# Patient Record
Sex: Female | Born: 1995 | Race: White | Hispanic: No | Marital: Single | Smoking: Former smoker
Health system: Southern US, Community
[De-identification: ages and names within clinical notes are randomized; demographics above are authoritative.]

## PROBLEM LIST (undated history)

## (undated) ENCOUNTER — Inpatient Hospital Stay (HOSPITAL_COMMUNITY): Payer: Self-pay

## (undated) DIAGNOSIS — F419 Anxiety disorder, unspecified: Secondary | ICD-10-CM

## (undated) DIAGNOSIS — R1031 Right lower quadrant pain: Principal | ICD-10-CM

## (undated) DIAGNOSIS — E039 Hypothyroidism, unspecified: Secondary | ICD-10-CM

## (undated) DIAGNOSIS — O8612 Endometritis following delivery: Secondary | ICD-10-CM

## (undated) DIAGNOSIS — F329 Major depressive disorder, single episode, unspecified: Secondary | ICD-10-CM

## (undated) DIAGNOSIS — R569 Unspecified convulsions: Secondary | ICD-10-CM

## (undated) DIAGNOSIS — F32A Depression, unspecified: Secondary | ICD-10-CM

## (undated) DIAGNOSIS — Z3401 Encounter for supervision of normal first pregnancy, first trimester: Principal | ICD-10-CM

## (undated) DIAGNOSIS — F1190 Opioid use, unspecified, uncomplicated: Secondary | ICD-10-CM

## (undated) DIAGNOSIS — N159 Renal tubulo-interstitial disease, unspecified: Secondary | ICD-10-CM

## (undated) HISTORY — DX: Right lower quadrant pain: R10.31

## (undated) HISTORY — DX: Endometritis following delivery: O86.12

## (undated) HISTORY — DX: Encounter for supervision of normal first pregnancy, first trimester: Z34.01

## (undated) HISTORY — DX: Renal tubulo-interstitial disease, unspecified: N15.9

## (undated) HISTORY — DX: Hypothyroidism, unspecified: E03.9

## (undated) HISTORY — PX: WISDOM TOOTH EXTRACTION: SHX21

---
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
## 2013-06-27 ENCOUNTER — Other Ambulatory Visit: Payer: Self-pay

## 2013-06-27 MED ORDER — LEVONORGESTREL-ETHINYL ESTRAD 0.1-20 MG-MCG PO TABS: 1.0000 | ORAL_TABLET | Freq: Every day | ORAL | Status: DC

## 2013-06-27 NOTE — Telephone Encounter (Signed)
Last seen 1/14  MMM  No PAP

## 2013-08-12 ENCOUNTER — Encounter (HOSPITAL_COMMUNITY): Payer: Self-pay | Admitting: Emergency Medicine

## 2013-08-12 ENCOUNTER — Emergency Department (HOSPITAL_COMMUNITY)
Admission: EM | Admit: 2013-08-12 | Discharge: 2013-08-12 | Disposition: A | Discharge status: Home / Self Care | Payer: Medicaid Other | Attending: Emergency Medicine | Admitting: Emergency Medicine

## 2013-08-12 ENCOUNTER — Emergency Department (HOSPITAL_COMMUNITY): Payer: Medicaid Other

## 2013-08-12 DIAGNOSIS — S61412A Laceration without foreign body of left hand, initial encounter: Secondary | ICD-10-CM

## 2013-08-12 DIAGNOSIS — S0990XA Unspecified injury of head, initial encounter: Secondary | ICD-10-CM

## 2013-08-12 DIAGNOSIS — S61409A Unspecified open wound of unspecified hand, initial encounter: Secondary | ICD-10-CM | POA: Insufficient documentation

## 2013-08-12 DIAGNOSIS — S0003XA Contusion of scalp, initial encounter: Secondary | ICD-10-CM | POA: Insufficient documentation

## 2013-08-12 DIAGNOSIS — Z3202 Encounter for pregnancy test, result negative: Secondary | ICD-10-CM | POA: Insufficient documentation

## 2013-08-12 DIAGNOSIS — IMO0002 Reserved for concepts with insufficient information to code with codable children: Secondary | ICD-10-CM | POA: Insufficient documentation

## 2013-08-12 DIAGNOSIS — T7491XA Unspecified adult maltreatment, confirmed, initial encounter: Secondary | ICD-10-CM | POA: Insufficient documentation

## 2013-08-12 DIAGNOSIS — T7492XA Unspecified child maltreatment, confirmed, initial encounter: Secondary | ICD-10-CM | POA: Insufficient documentation

## 2013-08-12 DIAGNOSIS — S0993XA Unspecified injury of face, initial encounter: Secondary | ICD-10-CM | POA: Insufficient documentation

## 2013-08-12 MED ORDER — IBUPROFEN 400 MG PO TABS
400.0000 mg | ORAL_TABLET | Freq: Once | ORAL | Status: AC
  Administered 2013-08-12: 400 mg via ORAL
  Filled 2013-08-12: qty 1

## 2013-08-12 MED ORDER — IBUPROFEN 800 MG PO TABS: 800.0000 mg | ORAL_TABLET | Freq: Once | ORAL | Status: DC
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
## 2013-08-12 NOTE — ED Notes (Signed)
Pt undress and collar placed on pt. Family at the bedside.

## 2013-08-12 NOTE — ED Notes (Signed)
Pt soaking hand to clean

## 2013-08-12 NOTE — ED Provider Notes (Signed)
Medical screening examination/treatment/procedure(s) were performed by non-physician practitioner and as supervising physician I was immediately available for consultation/collaboration.  EKG Interpretation   None         Lyanne Co, MD 08/12/13 2302
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
## 2013-08-21 NOTE — L&D Delivery Note (Signed)
Delivery Note Pt was noted to be complete and ready to push. At 12:13 AM a viable female was delivered via  (Presentation: ROA  ).  APGAR: 8, 9;   Placenta status: Intact, Spontaneous.  Cord: 3 vessels with the following complications: None.    Anesthesia: Epidural  Episiotomy: none Lacerations: 2nd degree perineal Suture Repair: 2.0 vicryl Est. Blood Loss (mL): 300  Mom to postpartum.  Baby to Couplet care / Skin to Skin.  Tomma Rakers, MD, PGY3 05/07/2014, 12:47 AM

## 2013-08-21 NOTE — L&D Delivery Note (Signed)
I have seen and examined this patient and I agree with the above. Cam Hai CNM 12:50 AM 05/07/2014

## 2013-09-15 ENCOUNTER — Other Ambulatory Visit: Payer: Self-pay | Admitting: Obstetrics & Gynecology

## 2013-09-15 DIAGNOSIS — O3680X0 Pregnancy with inconclusive fetal viability, not applicable or unspecified: Secondary | ICD-10-CM

## 2013-09-18 ENCOUNTER — Ambulatory Visit (INDEPENDENT_AMBULATORY_CARE_PROVIDER_SITE_OTHER): Payer: Medicaid Other

## 2013-09-18 ENCOUNTER — Encounter (INDEPENDENT_AMBULATORY_CARE_PROVIDER_SITE_OTHER): Payer: Self-pay

## 2013-09-18 DIAGNOSIS — O3680X0 Pregnancy with inconclusive fetal viability, not applicable or unspecified: Secondary | ICD-10-CM

## 2013-09-18 NOTE — Progress Notes (Signed)
U/S-single IUP with +FCA noted, FHR-159bpm, cx appears long and closed, bilateral adnexa appears wnl, no free fluid noted, CRL c/w dates of EDD 05/04/2014

## 2013-09-23 ENCOUNTER — Ambulatory Visit (INDEPENDENT_AMBULATORY_CARE_PROVIDER_SITE_OTHER): Payer: Medicaid Other | Admitting: Adult Health

## 2013-09-23 ENCOUNTER — Encounter: Payer: Self-pay | Admitting: Adult Health

## 2013-09-23 ENCOUNTER — Encounter (INDEPENDENT_AMBULATORY_CARE_PROVIDER_SITE_OTHER): Payer: Self-pay

## 2013-09-23 VITALS — BP 106/60 | Wt 136.0 lb

## 2013-09-23 DIAGNOSIS — O360990 Maternal care for other rhesus isoimmunization, unspecified trimester, not applicable or unspecified: Secondary | ICD-10-CM

## 2013-09-23 DIAGNOSIS — O210 Mild hyperemesis gravidarum: Secondary | ICD-10-CM

## 2013-09-23 DIAGNOSIS — Z3401 Encounter for supervision of normal first pregnancy, first trimester: Secondary | ICD-10-CM

## 2013-09-23 DIAGNOSIS — Z1389 Encounter for screening for other disorder: Secondary | ICD-10-CM

## 2013-09-23 DIAGNOSIS — Z3400 Encounter for supervision of normal first pregnancy, unspecified trimester: Secondary | ICD-10-CM

## 2013-09-23 DIAGNOSIS — Z331 Pregnant state, incidental: Secondary | ICD-10-CM

## 2013-09-23 HISTORY — DX: Encounter for supervision of normal first pregnancy, first trimester: Z34.01

## 2013-09-23 LAB — POCT URINALYSIS DIPSTICK
GLUCOSE UA: NEGATIVE
Ketones, UA: NEGATIVE
Leukocytes, UA: NEGATIVE
Nitrite, UA: NEGATIVE
Protein, UA: NEGATIVE
RBC UA: NEGATIVE

## 2013-09-23 MED ORDER — DOXYLAMINE-PYRIDOXINE 10-10 MG PO TBEC: DELAYED_RELEASE_TABLET | ORAL | Status: DC

## 2013-09-23 MED ORDER — PRENATAL PLUS 27-1 MG PO TABS: 1.0000 | ORAL_TABLET | Freq: Every day | ORAL | Status: DC

## 2013-09-23 NOTE — Progress Notes (Signed)
  Subjective:    Denise Sandoval is a 18 y.o. G1P0 Caucasian female at 4374w1d by US being seen today for her first obstetrical visit.  Her obstetrical history is significant for nausea.  Pregnancy history fully reviewed.   Patient reports nausea. Denies vb, cramping, uti s/s, abnormal/malodorous vag d/c, or vulvovaginal itching/irritation.  Filed Vitals:   09/23/13 1442  BP: 106/60  Weight: 136 lb (61.689 kg)    HISTORY: OB History  Gravida Para Term Preterm AB SAB TAB Ectopic Multiple Living  1             # Outcome Date GA Lbr Len/2nd Weight Sex Delivery Anes PTL Lv  1 CUR              Past Medical History  Diagnosis Date  . Supervision of normal first pregnancy in first trimester 09/23/2013   History reviewed. No pertinent past surgical history. Family History  Problem Relation Age of Onset  . Alcohol abuse Mother   . Alcohol abuse Father   . Hypertension Maternal Grandmother   . Alcohol abuse Maternal Grandfather      Exam    Pelvic Exam:    Perineum: deferred   Vulva: deferred   Vagina:  deferred   Uterus Normal size/shape/contour for GA     Cervix: deferred   Adnexa: Not palpable   Urinary: deferred    System:     Skin: normal coloration and turgor, no rashes    Neurologic: oriented, normal mood   Extremities: normal strength, tone, and muscle mass   HEENT PERRLA, normal thyroid   Mouth/Teeth mucous membranes moist   Cardiovascular: regular rate and rhythm   Respiratory:  appears well, vitals normal, no respiratory distress, acyanotic, normal RR   Abdomen: soft, non-tender    FHR: 175   Assessment:    Pregnancy: G1P0 Patient Active Problem List   Diagnosis Date Noted  . Supervision of normal first pregnancy in first trimester 09/23/2013  . TSS (toxic shock syndrome) 03/28/2012  . ARF (acute renal failure) 03/26/2012  . Fever of unknown origin 03/24/2012  . Tachycardia 03/24/2012  . Leukocytosis 03/24/2012  . Hypokalemia 03/24/2012  . Abnormal  LFTs 03/24/2012      1474w1d G1P0 New OB visit    Plan:     Initial labs drawn Continue prenatal vitamins Problem list reviewed and updated Reviewed n/v relief measures and warning s/s to report Reviewed recommended weight gain based on pre-gravid BMI Encouraged well-balanced diet Genetic Screening discussed Integrated Screen: requested Cystic fibrosis screening discussed requested Ultrasound discussed; fetal survey: requested Follow up in 4 weeks for IT/NT and see me CCNC done Declines flu shot today Trial diclegis Number of samples 60 Lot number 1319-1    Exp date 06/20/14  GRIFFIN,JENNIFER 09/23/2013 3:16 PM

## 2013-09-23 NOTE — Progress Notes (Signed)
Pt given CCNC form and lab consent to read over and sign. Pt denies any problems or concerns at this time.  

## 2013-09-23 NOTE — Patient Instructions (Signed)

## 2013-09-24 ENCOUNTER — Encounter: Payer: Self-pay | Admitting: Adult Health

## 2013-09-24 LAB — DRUG SCREEN, URINE, NO CONFIRMATION
Amphetamine Screen, Ur: NEGATIVE
BARBITURATE QUANT UR: NEGATIVE
BENZODIAZEPINES.: NEGATIVE
COCAINE METABOLITES: NEGATIVE
CREATININE, U: 103 mg/dL
Marijuana Metabolite: POSITIVE — AB
Methadone: NEGATIVE
Opiate Screen, Urine: NEGATIVE
PHENCYCLIDINE (PCP): NEGATIVE
Propoxyphene: NEGATIVE

## 2013-09-24 LAB — CBC
HCT: 37.4 % (ref 36.0–46.0)
Hemoglobin: 12.7 g/dL (ref 12.0–15.0)
MCH: 30.5 pg (ref 26.0–34.0)
MCHC: 34 g/dL (ref 30.0–36.0)
MCV: 89.9 fL (ref 78.0–100.0)
Platelets: 305 10*3/uL (ref 150–400)
RBC: 4.16 MIL/uL (ref 3.87–5.11)
RDW: 13.9 % (ref 11.5–15.5)
WBC: 10 10*3/uL (ref 4.0–10.5)

## 2013-09-24 LAB — ANTIBODY SCREEN: ANTIBODY SCREEN: NEGATIVE

## 2013-09-24 LAB — TSH: TSH: 0.469 u[IU]/mL (ref 0.350–4.500)

## 2013-09-24 LAB — URINALYSIS
Bilirubin Urine: NEGATIVE
Glucose, UA: NEGATIVE mg/dL
HGB URINE DIPSTICK: NEGATIVE
Ketones, ur: NEGATIVE mg/dL
LEUKOCYTES UA: NEGATIVE
NITRITE: NEGATIVE
PROTEIN: NEGATIVE mg/dL
Specific Gravity, Urine: 1.018 (ref 1.005–1.030)
UROBILINOGEN UA: 0.2 mg/dL (ref 0.0–1.0)
pH: 7.5 (ref 5.0–8.0)

## 2013-09-24 LAB — ABO AND RH: RH TYPE: NEGATIVE

## 2013-09-24 LAB — HIV ANTIBODY (ROUTINE TESTING W REFLEX): HIV: NONREACTIVE

## 2013-09-24 LAB — GC/CHLAMYDIA PROBE AMP
CT PROBE, AMP APTIMA: NEGATIVE
GC PROBE AMP APTIMA: NEGATIVE

## 2013-09-24 LAB — OXYCODONE SCREEN, UA, RFLX CONFIRM: Oxycodone Screen, Ur: NEGATIVE ng/mL

## 2013-09-24 LAB — RUBELLA SCREEN: RUBELLA: 0.72 {index} (ref <0.90)

## 2013-09-24 LAB — RPR

## 2013-09-24 LAB — VARICELLA ZOSTER ANTIBODY, IGG: VARICELLA IGG: 226.5 {index} — AB (ref <135.00)

## 2013-09-24 LAB — HEPATITIS B SURFACE ANTIGEN: Hepatitis B Surface Ag: NEGATIVE

## 2013-09-25 LAB — URINE CULTURE
Colony Count: NO GROWTH
Organism ID, Bacteria: NO GROWTH

## 2013-09-26 LAB — CYSTIC FIBROSIS DIAGNOSTIC STUDY

## 2013-09-29 ENCOUNTER — Encounter: Payer: Self-pay | Admitting: Adult Health

## 2013-10-21 ENCOUNTER — Encounter: Payer: Medicaid Other | Admitting: Advanced Practice Midwife

## 2013-10-21 ENCOUNTER — Other Ambulatory Visit: Payer: Medicaid Other

## 2013-12-04 ENCOUNTER — Encounter: Payer: Self-pay | Admitting: Adult Health

## 2013-12-04 ENCOUNTER — Ambulatory Visit (INDEPENDENT_AMBULATORY_CARE_PROVIDER_SITE_OTHER): Payer: Medicaid Other | Admitting: Adult Health

## 2013-12-04 VITALS — BP 120/72 | Wt 148.0 lb

## 2013-12-04 DIAGNOSIS — O0930 Supervision of pregnancy with insufficient antenatal care, unspecified trimester: Secondary | ICD-10-CM

## 2013-12-04 DIAGNOSIS — Z1389 Encounter for screening for other disorder: Secondary | ICD-10-CM

## 2013-12-04 DIAGNOSIS — Z331 Pregnant state, incidental: Secondary | ICD-10-CM

## 2013-12-04 DIAGNOSIS — O360990 Maternal care for other rhesus isoimmunization, unspecified trimester, not applicable or unspecified: Secondary | ICD-10-CM

## 2013-12-04 DIAGNOSIS — Z3400 Encounter for supervision of normal first pregnancy, unspecified trimester: Secondary | ICD-10-CM

## 2013-12-04 LAB — POCT URINALYSIS DIPSTICK
Glucose, UA: NEGATIVE
KETONES UA: NEGATIVE
Leukocytes, UA: NEGATIVE
Nitrite, UA: NEGATIVE
Protein, UA: NEGATIVE
RBC UA: NEGATIVE

## 2013-12-04 MED ORDER — PRENATAL PLUS 27-1 MG PO TABS: 1.0000 | ORAL_TABLET | Freq: Every day | ORAL | Status: DC

## 2013-12-04 NOTE — Patient Instructions (Signed)
Second Trimester of Pregnancy The second trimester is from week 13 through week 28, months 4 through 6. The second trimester is often a time when you feel your best. Your body has also adjusted to being pregnant, and you begin to feel better physically. Usually, morning sickness has lessened or quit completely, you may have more energy, and you may have an increase in appetite. The second trimester is also a time when the fetus is growing rapidly. At the end of the sixth month, the fetus is about 9 inches long and weighs about 1 pounds. You will likely begin to feel the baby move (quickening) between 18 and 20 weeks of the pregnancy. BODY CHANGES Your body goes through many changes during pregnancy. The changes vary from woman to woman.   Your weight will continue to increase. You will notice your lower abdomen bulging out.  You may begin to get stretch marks on your hips, abdomen, and breasts.  You may develop headaches that can be relieved by medicines approved by your caregiver.  You may urinate more often because the fetus is pressing on your bladder.  You may develop or continue to have heartburn as a result of your pregnancy.  You may develop constipation because certain hormones are causing the muscles that push waste through your intestines to slow down.  You may develop hemorrhoids or swollen, bulging veins (varicose veins).  You may have back pain because of the weight gain and pregnancy hormones relaxing your joints between the bones in your pelvis and as a result of a shift in weight and the muscles that support your balance.  Your breasts will continue to grow and be tender.  Your gums may bleed and may be sensitive to brushing and flossing.  Dark spots or blotches (chloasma, mask of pregnancy) may develop on your face. This will likely fade after the baby is born.  A dark line from your belly button to the pubic area (linea nigra) may appear. This will likely fade after the  baby is born. WHAT TO EXPECT AT YOUR PRENATAL VISITS During a routine prenatal visit:  You will be weighed to make sure you and the fetus are growing normally.  Your blood pressure will be taken.  Your abdomen will be measured to track your baby's growth.  The fetal heartbeat will be listened to.  Any test results from the previous visit will be discussed. Your caregiver may ask you:  How you are feeling.  If you are feeling the baby move.  If you have had any abnormal symptoms, such as leaking fluid, bleeding, severe headaches, or abdominal cramping.  If you have any questions. Other tests that may be performed during your second trimester include:  Blood tests that check for:  Low iron levels (anemia).  Gestational diabetes (between 24 and 28 weeks).  Rh antibodies.  Urine tests to check for infections, diabetes, or protein in the urine.  An ultrasound to confirm the proper growth and development of the baby.  An amniocentesis to check for possible genetic problems.  Fetal screens for spina bifida and Down syndrome. HOME CARE INSTRUCTIONS   Avoid all smoking, herbs, alcohol, and unprescribed drugs. These chemicals affect the formation and growth of the baby.  Follow your caregiver's instructions regarding medicine use. There are medicines that are either safe or unsafe to take during pregnancy.  Exercise only as directed by your caregiver. Experiencing uterine cramps is a good sign to stop exercising.  Continue to eat regular,   healthy meals.  Wear a good support bra for breast tenderness.  Do not use hot tubs, steam rooms, or saunas.  Wear your seat belt at all times when driving.  Avoid raw meat, uncooked cheese, cat litter boxes, and soil used by cats. These carry germs that can cause birth defects in the baby.  Take your prenatal vitamins.  Try taking a stool softener (if your caregiver approves) if you develop constipation. Eat more high-fiber foods,  such as fresh vegetables or fruit and whole grains. Drink plenty of fluids to keep your urine clear or pale yellow.  Take warm sitz baths to soothe any pain or discomfort caused by hemorrhoids. Use hemorrhoid cream if your caregiver approves.  If you develop varicose veins, wear support hose. Elevate your feet for 15 minutes, 3 4 times a day. Limit salt in your diet.  Avoid heavy lifting, wear low heel shoes, and practice good posture.  Rest with your legs elevated if you have leg cramps or low back pain.  Visit your dentist if you have not gone yet during your pregnancy. Use a soft toothbrush to brush your teeth and be gentle when you floss.  A sexual relationship may be continued unless your caregiver directs you otherwise.  Continue to go to all your prenatal visits as directed by your caregiver. SEEK MEDICAL CARE IF:   You have dizziness.  You have mild pelvic cramps, pelvic pressure, or nagging pain in the abdominal area.  You have persistent nausea, vomiting, or diarrhea.  You have a bad smelling vaginal discharge.  You have pain with urination. SEEK IMMEDIATE MEDICAL CARE IF:   You have a fever.  You are leaking fluid from your vagina.  You have spotting or bleeding from your vagina.  You have severe abdominal cramping or pain.  You have rapid weight gain or loss.  You have shortness of breath with chest pain.  You notice sudden or extreme swelling of your face, hands, ankles, feet, or legs.  You have not felt your baby move in over an hour.  You have severe headaches that do not go away with medicine.  You have vision changes. Document Released: 08/01/2001 Document Revised: 04/09/2013 Document Reviewed: 10/08/2012 [facility] Patient Information 2014 [place], [place]. Follow up in 2 weeks for US and see fran

## 2013-12-04 NOTE — Progress Notes (Signed)
[place], has some round ligament pain, has felt movement, FHR 153, no bleeding, no discharge, will get AFP and then return in 2 weeks for US.

## 2013-12-04 NOTE — Progress Notes (Signed)
Pt denies any problems or concerns at this time.  

## 2013-12-05 ENCOUNTER — Encounter: Payer: Self-pay | Admitting: Adult Health

## 2013-12-05 LAB — AFP, QUAD SCREEN
AFP: 44.2 IU/mL
Curr Gest Age: 18.6 wks.days
HCG TOTAL: 16229 m[IU]/mL
INH: 296.2 pg/mL
Interpretation-AFP: NEGATIVE
MOM FOR AFP: 0.98
MOM FOR HCG: 0.94
MOM FOR INH: 1.55
OPEN SPINA BIFIDA: NEGATIVE
Osb Risk: 1:14500 {titer}
TRI 18 SCR RISK EST: NEGATIVE
Trisomy 18 (Edward) Syndrome Interp.: 1:80800 {titer}
uE3 Mom: 1.04
uE3 Value: 1 ng/mL

## 2013-12-17 ENCOUNTER — Ambulatory Visit (INDEPENDENT_AMBULATORY_CARE_PROVIDER_SITE_OTHER): Payer: Medicaid Other | Admitting: Obstetrics and Gynecology

## 2013-12-17 ENCOUNTER — Encounter: Payer: Self-pay | Admitting: Obstetrics and Gynecology

## 2013-12-17 VITALS — BP 104/60 | Wt 148.0 lb

## 2013-12-17 DIAGNOSIS — O2340 Unspecified infection of urinary tract in pregnancy, unspecified trimester: Secondary | ICD-10-CM

## 2013-12-17 DIAGNOSIS — Z1389 Encounter for screening for other disorder: Secondary | ICD-10-CM

## 2013-12-17 DIAGNOSIS — R109 Unspecified abdominal pain: Secondary | ICD-10-CM

## 2013-12-17 DIAGNOSIS — Z331 Pregnant state, incidental: Secondary | ICD-10-CM

## 2013-12-17 DIAGNOSIS — O2390 Unspecified genitourinary tract infection in pregnancy, unspecified trimester: Secondary | ICD-10-CM

## 2013-12-17 LAB — POCT URINALYSIS DIPSTICK
Blood, UA: 4
Glucose, UA: NEGATIVE
KETONES UA: NEGATIVE
Nitrite, UA: POSITIVE
PROTEIN UA: NEGATIVE

## 2013-12-17 LAB — CBC
HEMATOCRIT: 32.1 % — AB (ref 36.0–46.0)
Hemoglobin: 11.3 g/dL — ABNORMAL LOW (ref 12.0–15.0)
MCH: 32.2 pg (ref 26.0–34.0)
MCHC: 35.2 g/dL (ref 30.0–36.0)
MCV: 91.5 fL (ref 78.0–100.0)
Platelets: 272 10*3/uL (ref 150–400)
RBC: 3.51 MIL/uL — ABNORMAL LOW (ref 3.87–5.11)
RDW: 13.5 % (ref 11.5–15.5)
WBC: 13.3 10*3/uL — AB (ref 4.0–10.5)

## 2013-12-17 MED ORDER — CEPHALEXIN 500 MG PO CAPS: 500.0000 mg | ORAL_CAPSULE | Freq: Four times a day (QID) | ORAL | Status: DC

## 2013-12-17 NOTE — Progress Notes (Signed)
Pt states that she has had some pain on her right side. Pt had positive nitrates, 4+ blood and trace of leukocytes. Will send today for UA C&S.

## 2013-12-17 NOTE — Patient Instructions (Addendum)
To have ultrasound in a.m. [facility]

## 2013-12-17 NOTE — Progress Notes (Signed)
7567w2d. G1P0. Having sharp upper back pain x 1 month, constant x1 wk. No fever or dysuria. UA has trace leukocytes, positive nitrates and 4 RBCs.   PE: Mild right flank tenderness  P: Keflex CBC today U/S tomorrow, schedule with Drenda FreezeFran afterwards

## 2013-12-17 NOTE — Addendum Note (Signed)
Addended by: Richardson ChiquitoRAVIS, Kelly Ranieri M on: 12/17/2013 11:57 AM   Modules accepted: Orders

## 2013-12-18 ENCOUNTER — Other Ambulatory Visit: Payer: Self-pay | Admitting: Adult Health

## 2013-12-18 ENCOUNTER — Ambulatory Visit (INDEPENDENT_AMBULATORY_CARE_PROVIDER_SITE_OTHER): Payer: Medicaid Other

## 2013-12-18 ENCOUNTER — Encounter: Payer: Self-pay | Admitting: Obstetrics & Gynecology

## 2013-12-18 ENCOUNTER — Ambulatory Visit (INDEPENDENT_AMBULATORY_CARE_PROVIDER_SITE_OTHER): Payer: Medicaid Other | Admitting: Obstetrics & Gynecology

## 2013-12-18 VITALS — BP 110/66 | Wt 148.0 lb

## 2013-12-18 DIAGNOSIS — Z331 Pregnant state, incidental: Secondary | ICD-10-CM

## 2013-12-18 DIAGNOSIS — O99320 Drug use complicating pregnancy, unspecified trimester: Principal | ICD-10-CM

## 2013-12-18 DIAGNOSIS — Z363 Encounter for antenatal screening for malformations: Secondary | ICD-10-CM

## 2013-12-18 DIAGNOSIS — F1920 Other psychoactive substance dependence, uncomplicated: Secondary | ICD-10-CM

## 2013-12-18 DIAGNOSIS — O360990 Maternal care for other rhesus isoimmunization, unspecified trimester, not applicable or unspecified: Secondary | ICD-10-CM

## 2013-12-18 DIAGNOSIS — O0930 Supervision of pregnancy with insufficient antenatal care, unspecified trimester: Secondary | ICD-10-CM

## 2013-12-18 DIAGNOSIS — Z3400 Encounter for supervision of normal first pregnancy, unspecified trimester: Secondary | ICD-10-CM

## 2013-12-18 DIAGNOSIS — O2390 Unspecified genitourinary tract infection in pregnancy, unspecified trimester: Secondary | ICD-10-CM

## 2013-12-18 DIAGNOSIS — Z1389 Encounter for screening for other disorder: Secondary | ICD-10-CM

## 2013-12-18 LAB — POCT URINALYSIS DIPSTICK
Blood, UA: NEGATIVE
GLUCOSE UA: NEGATIVE
Ketones, UA: NEGATIVE
LEUKOCYTES UA: NEGATIVE
Nitrite, UA: NEGATIVE
PROTEIN UA: NEGATIVE

## 2013-12-18 LAB — URINALYSIS
BILIRUBIN URINE: NEGATIVE
Glucose, UA: NEGATIVE mg/dL
KETONES UR: NEGATIVE mg/dL
Nitrite: POSITIVE — AB
PH: 6.5 (ref 5.0–8.0)
Protein, ur: NEGATIVE mg/dL
Specific Gravity, Urine: 1.017 (ref 1.005–1.030)
Urobilinogen, UA: 0.2 mg/dL (ref 0.0–1.0)

## 2013-12-18 NOTE — Progress Notes (Signed)
Sonogram is reviewed and report is done  BP weight and urine results all reviewed and noted. Patient reports good fetal movement, denies any bleeding and no rupture of membranes symptoms or regular contractions. Patient is without complaints. All questions were answered.  

## 2013-12-18 NOTE — Progress Notes (Signed)
Pt denies any problems or concerns at this time.  

## 2013-12-18 NOTE — Progress Notes (Signed)
U/S(20+3wks)- active fetus, meas c/w dates, fluid wnl, posterior Gr 0 placenta, cx appears closed (3.5cm), bilateral adnexa appears wnl, no major abnl noted, female fetus, FHR- 157 bpm

## 2013-12-19 ENCOUNTER — Telehealth: Payer: Self-pay | Admitting: Obstetrics and Gynecology

## 2013-12-19 NOTE — Telephone Encounter (Signed)
Pt responding to keflex. CVAT relieved.

## 2013-12-20 LAB — URINE CULTURE: Colony Count: >=100000

## 2013-12-30 ENCOUNTER — Encounter (HOSPITAL_COMMUNITY): Payer: Self-pay | Admitting: Emergency Medicine

## 2013-12-30 ENCOUNTER — Inpatient Hospital Stay (HOSPITAL_COMMUNITY)
Admission: EM | Admit: 2013-12-30 | Discharge: 2014-01-02 | DRG: 781 | Disposition: A | Discharge status: Home / Self Care | Payer: Medicaid Other | Attending: Obstetrics & Gynecology | Admitting: Obstetrics & Gynecology

## 2013-12-30 ENCOUNTER — Telehealth: Payer: Self-pay | Admitting: Obstetrics & Gynecology

## 2013-12-30 DIAGNOSIS — R109 Unspecified abdominal pain: Secondary | ICD-10-CM | POA: Diagnosis present

## 2013-12-30 DIAGNOSIS — O2302 Infections of kidney in pregnancy, second trimester: Secondary | ICD-10-CM | POA: Diagnosis present

## 2013-12-30 DIAGNOSIS — Z87891 Personal history of nicotine dependence: Secondary | ICD-10-CM

## 2013-12-30 DIAGNOSIS — O2390 Unspecified genitourinary tract infection in pregnancy, unspecified trimester: Principal | ICD-10-CM | POA: Diagnosis present

## 2013-12-30 DIAGNOSIS — N12 Tubulo-interstitial nephritis, not specified as acute or chronic: Secondary | ICD-10-CM | POA: Diagnosis present

## 2013-12-30 LAB — URINE MICROSCOPIC-ADD ON

## 2013-12-30 LAB — COMPREHENSIVE METABOLIC PANEL
ALBUMIN: 3.2 g/dL — AB (ref 3.5–5.2)
ALT: 9 U/L (ref 0–35)
AST: 14 U/L (ref 0–37)
Alkaline Phosphatase: 51 U/L (ref 39–117)
BILIRUBIN TOTAL: 0.2 mg/dL — AB (ref 0.3–1.2)
BUN: 5 mg/dL — ABNORMAL LOW (ref 6–23)
CALCIUM: 8.8 mg/dL (ref 8.4–10.5)
CHLORIDE: 99 meq/L (ref 96–112)
CO2: 23 mEq/L (ref 19–32)
Creatinine, Ser: 0.52 mg/dL (ref 0.50–1.10)
GFR calc Af Amer: >90 mL/min (ref >90)
GFR calc non Af Amer: >90 mL/min (ref >90)
Glucose, Bld: 77 mg/dL (ref 70–99)
Potassium: 4 mEq/L (ref 3.7–5.3)
Sodium: 134 mEq/L — ABNORMAL LOW (ref 137–147)
Total Protein: 6.7 g/dL (ref 6.0–8.3)

## 2013-12-30 LAB — URINALYSIS, ROUTINE W REFLEX MICROSCOPIC
Bilirubin Urine: NEGATIVE
GLUCOSE, UA: NEGATIVE mg/dL
KETONES UR: NEGATIVE mg/dL
NITRITE: NEGATIVE
PROTEIN: 30 mg/dL — AB
Specific Gravity, Urine: 1.015 (ref 1.005–1.030)
Urobilinogen, UA: 0.2 mg/dL (ref 0.0–1.0)
pH: 7.5 (ref 5.0–8.0)

## 2013-12-30 LAB — CBC WITH DIFFERENTIAL/PLATELET
BASOS ABS: 0 10*3/uL (ref 0.0–0.1)
Basophils Relative: 0 % (ref 0–1)
Eosinophils Absolute: 0 10*3/uL (ref 0.0–0.7)
Eosinophils Relative: 0 % (ref 0–5)
HCT: 31.2 % — ABNORMAL LOW (ref 36.0–46.0)
Hemoglobin: 10.7 g/dL — ABNORMAL LOW (ref 12.0–15.0)
LYMPHS PCT: 10 % — AB (ref 12–46)
Lymphs Abs: 1.7 10*3/uL (ref 0.7–4.0)
MCH: 32 pg (ref 26.0–34.0)
MCHC: 34.3 g/dL (ref 30.0–36.0)
MCV: 93.4 fL (ref 78.0–100.0)
Monocytes Absolute: 0.9 10*3/uL (ref 0.1–1.0)
Monocytes Relative: 5 % (ref 3–12)
NEUTROS ABS: 14.4 10*3/uL — AB (ref 1.7–7.7)
Neutrophils Relative %: 85 % — ABNORMAL HIGH (ref 43–77)
PLATELETS: 261 10*3/uL (ref 150–400)
RBC: 3.34 MIL/uL — ABNORMAL LOW (ref 3.87–5.11)
RDW: 12.8 % (ref 11.5–15.5)
WBC: 17 10*3/uL — AB (ref 4.0–10.5)

## 2013-12-30 MED ORDER — PRENATAL MULTIVITAMIN CH
1.0000 | ORAL_TABLET | Freq: Every day | ORAL | Status: DC
  Administered 2013-12-31 – 2014-01-01 (×2): 1 via ORAL
  Filled 2013-12-30 (×2): qty 1

## 2013-12-30 MED ORDER — OXYCODONE-ACETAMINOPHEN 5-325 MG PO TABS
1.0000 | ORAL_TABLET | ORAL | Status: DC | PRN
  Administered 2013-12-30 – 2014-01-02 (×13): 2 via ORAL
  Filled 2013-12-30 (×13): qty 2

## 2013-12-30 MED ORDER — SODIUM CHLORIDE 0.9 % IV SOLN: Freq: Once | INTRAVENOUS | Status: DC

## 2013-12-30 MED ORDER — METOCLOPRAMIDE HCL 5 MG/ML IJ SOLN
10.0000 mg | Freq: Once | INTRAMUSCULAR | Status: AC
  Administered 2013-12-30: 10 mg via INTRAVENOUS
  Filled 2013-12-30: qty 2

## 2013-12-30 MED ORDER — ZOLPIDEM TARTRATE 5 MG PO TABS
5.0000 mg | ORAL_TABLET | Freq: Every evening | ORAL | Status: DC | PRN
  Administered 2013-12-31 – 2014-01-01 (×2): 5 mg via ORAL
  Filled 2013-12-30 (×2): qty 1

## 2013-12-30 MED ORDER — CALCIUM CARBONATE ANTACID 500 MG PO CHEW: 2.0000 | CHEWABLE_TABLET | ORAL | Status: DC | PRN

## 2013-12-30 MED ORDER — DEXTROSE 5 % IV SOLN
1.0000 g | Freq: Two times a day (BID) | INTRAVENOUS | Status: DC
  Administered 2013-12-31 – 2014-01-02 (×5): 1 g via INTRAVENOUS
  Filled 2013-12-30 (×6): qty 10

## 2013-12-30 MED ORDER — ACETAMINOPHEN 325 MG PO TABS: 650.0000 mg | ORAL_TABLET | ORAL | Status: DC | PRN

## 2013-12-30 MED ORDER — HYDROMORPHONE HCL PF 1 MG/ML IJ SOLN
0.5000 mg | Freq: Once | INTRAMUSCULAR | Status: AC
  Administered 2013-12-30: 0.5 mg via INTRAVENOUS
  Filled 2013-12-30: qty 1

## 2013-12-30 MED ORDER — SODIUM CHLORIDE 0.9 % IV BOLUS (SEPSIS)
500.0000 mL | Freq: Once | INTRAVENOUS | Status: AC
  Administered 2013-12-30: 1000 mL via INTRAVENOUS

## 2013-12-30 MED ORDER — SODIUM CHLORIDE 0.9 % IV SOLN
INTRAVENOUS | Status: DC
  Administered 2013-12-30: 18:00:00 via INTRAVENOUS

## 2013-12-30 MED ORDER — CEFTRIAXONE SODIUM 1 G IJ SOLR
1.0000 g | Freq: Once | INTRAMUSCULAR | Status: AC
Start: 2013-12-30 — End: 2013-12-30
  Administered 2013-12-30: 1 g via INTRAVENOUS
  Filled 2013-12-30: qty 10

## 2013-12-30 MED ORDER — LACTATED RINGERS IV SOLN: INTRAVENOUS | Status: DC

## 2013-12-30 MED ORDER — DOCUSATE SODIUM 100 MG PO CAPS
100.0000 mg | ORAL_CAPSULE | Freq: Every day | ORAL | Status: DC
  Administered 2013-12-30 – 2014-01-01 (×3): 100 mg via ORAL
  Filled 2013-12-30 (×3): qty 1

## 2013-12-30 MED ORDER — SODIUM CHLORIDE 0.9 % IV SOLN
INTRAVENOUS | Status: DC
  Administered 2014-01-01: 05:00:00 via INTRAVENOUS

## 2013-12-30 NOTE — ED Provider Notes (Signed)
CSN: [id number]     Arrival date & time 12/30/13  1229 History   First MD Initiated Contact with Patient 12/30/13 1232     Chief Complaint  Patient presents with  . Flank Pain     (Consider location/radiation/quality/duration/timing/severity/associated sxs/prior Treatment) Patient is a 18 y.o. female presenting with flank pain. The history is provided by the patient.  Flank Pain This is a new problem. The current episode started in the past 7 days. The problem occurs constantly. The problem has been gradually worsening. Associated symptoms include abdominal pain, chills, a fever, nausea and urinary symptoms. Pertinent negatives include no headaches, rash or vomiting. Nothing aggravates the symptoms.   Denise Sandoval is a 18 y.o. G1P0 @ [redacted]w[redacted]d gestation who presents to the ED with back pain. She states that she saw her OB last week and was diagnosed with a UTI. She was treated with Keflex but the symptoms have gotten worse. This morning the pain in her back was severe, she had fever up to 101 with chills and nausea.   Past Medical History  Diagnosis Date  . Supervision of normal first pregnancy in first trimester 09/23/2013   History reviewed. No pertinent past surgical history. Family History  Problem Relation Age of Onset  . Alcohol abuse Mother   . Alcohol abuse Father   . Hypertension Maternal Grandmother   . Alcohol abuse Maternal Grandfather    History  Substance Use Topics  . Smoking status: Former Games developer  . Smokeless tobacco: Never Used  . Alcohol Use: No   OB History   Grav Para Term Preterm Abortions TAB SAB Ect Mult Living   1              Review of Systems  Constitutional: Positive for fever and chills.  HENT: Negative.   Eyes: Negative for visual disturbance.  Gastrointestinal: Positive for nausea and abdominal pain. Negative for vomiting.  Genitourinary: Positive for dysuria, urgency, frequency, flank pain and pelvic pain. Negative for vaginal bleeding and  vaginal discharge.  Musculoskeletal: Positive for back pain.  Skin: Negative for rash.  Neurological: Negative for headaches.  Psychiatric/Behavioral: Negative for confusion. The patient is not nervous/anxious.       Allergies  Review of patient's allergies indicates no known allergies.  Home Medications   Prior to Admission medications   Medication Sig Start Date End Date Taking? Authorizing Provider  cephALEXin (KEFLEX) 500 MG capsule Take 1 capsule (500 mg total) by mouth 4 (four) times daily. 12/17/13   Tilda Burrow, MD  prenatal vitamin w/FE, FA (PRENATAL 1 + 1) 27-1 MG TABS tablet Take 1 tablet by mouth daily at 12 noon. 12/04/13   Adline Potter, NP  BP 107/54  Pulse 60  Temp(Src) 98.3 F (36.8 C) (Oral)  Resp 20  Ht 5\' 8"  (1.727 m)  Wt 148 lb (67.132 kg)  BMI 22.51 kg/m2  SpO2 99%  LMP 07/22/2013  Physical Exam  Nursing note and vitals reviewed. Constitutional: She is oriented to person, place, and time. She appears well-developed and well-nourished. No distress.  Very uncomfortable appearing.   HENT:  Head: Normocephalic.  Eyes: EOM are normal.  Neck: Neck supple.  Cardiovascular: Normal rate.   Pulmonary/Chest: Effort normal.  Abdominal: Soft. There is tenderness in the suprapubic area. There is CVA tenderness (right).  Positive FHT's  Musculoskeletal: Normal range of motion.  Neurological: She is alert and oriented to person, place, and time. No cranial nerve deficit.  Skin: Skin is warm and  dry.  Psychiatric: She has a normal mood and affect. Her behavior is normal.    ED Course  Procedures IV hydration, Reglan 10 mg, Dilaudid 0.5 mg IV, Labs, Rocephin 1 gram IV Labs Review  Results for orders placed during the hospital encounter of 12/30/13 (from the past 24 hour(s))  URINALYSIS, ROUTINE W REFLEX MICROSCOPIC     Status: Abnormal   Collection Time    12/30/13  1:00 PM      Result Value Ref Range   Color, Urine STRAW (*) YELLOW   APPearance  CLOUDY (*) CLEAR   Specific Gravity, Urine 1.015  1.005 - 1.030   pH 7.5  5.0 - 8.0   Glucose, UA NEGATIVE  NEGATIVE mg/dL   Hgb urine dipstick LARGE (*) NEGATIVE   Bilirubin Urine NEGATIVE  NEGATIVE   Ketones, ur NEGATIVE  NEGATIVE mg/dL   Protein, ur 30 (*) NEGATIVE mg/dL   Urobilinogen, UA 0.2  0.0 - 1.0 mg/dL   Nitrite NEGATIVE  NEGATIVE   Leukocytes, UA LARGE (*) NEGATIVE  COMPREHENSIVE METABOLIC PANEL     Status: Abnormal   Collection Time    12/30/13  1:00 PM      Result Value Ref Range   Sodium 134 (*) 137 - 147 mEq/L   Potassium 4.0  3.7 - 5.3 mEq/L   Chloride 99  96 - 112 mEq/L   CO2 23  19 - 32 mEq/L   Glucose, Bld 77  70 - 99 mg/dL   BUN 5 (*) 6 - 23 mg/dL   Creatinine, Ser 1.610.52  0.50 - 1.10 mg/dL   Calcium 8.8  8.4 - 09.610.5 mg/dL   Total Protein 6.7  6.0 - 8.3 g/dL   Albumin 3.2 (*) 3.5 - 5.2 g/dL   AST 14  0 - 37 U/L   ALT 9  0 - 35 U/L   Alkaline Phosphatase 51  39 - 117 U/L   Total Bilirubin 0.2 (*) 0.3 - 1.2 mg/dL   GFR calc non Af Amer >90  >90 mL/min   GFR calc Af Amer >90  >90 mL/min  URINE MICROSCOPIC-ADD ON     Status: Abnormal   Collection Time    12/30/13  1:00 PM      Result Value Ref Range   Squamous Epithelial / LPF MANY (*) RARE   WBC, UA TOO NUMEROUS TO COUNT  <3 WBC/hpf   RBC / HPF 21-50  <3 RBC/hpf   Bacteria, UA MANY (*) RARE  CBC WITH DIFFERENTIAL     Status: Abnormal   Collection Time    12/30/13  1:15 PM      Result Value Ref Range   WBC 17.0 (*) 4.0 - 10.5 K/uL   RBC 3.34 (*) 3.87 - 5.11 MIL/uL   Hemoglobin 10.7 (*) 12.0 - 15.0 g/dL   HCT 04.531.2 (*) 40.936.0 - 81.146.0 %   MCV 93.4  78.0 - 100.0 fL   MCH 32.0  26.0 - 34.0 pg   MCHC 34.3  30.0 - 36.0 g/dL   RDW 91.412.8  78.211.5 - 95.615.5 %   Platelets 261  150 - 400 K/uL   Neutrophils Relative % 85 (*) 43 - 77 %   Neutro Abs 14.4 (*) 1.7 - 7.7 K/uL   Lymphocytes Relative 10 (*) 12 - 46 %   Lymphs Abs 1.7  0.7 - 4.0 K/uL   Monocytes Relative 5  3 - 12 %   Monocytes Absolute 0.9  0.1 - 1.0 K/uL  Eosinophils Relative 0  0 - 5 %   Eosinophils Absolute 0.0  0.0 - 0.7 K/uL   Basophils Relative 0  0 - 1 %   Basophils Absolute 0.0  0.0 - 0.1 K/uL   Consult with Dr. Emelda FearFerguson and he [facility]. [facility] and patient to be a direct admit.  MDM  18 y.o. female with right CVA tenderness, fever, nausea and UTI symptoms. Will treat inpatient for pyelonephritis. [facility]. Discussed with the patient and she agrees with plan.    Janne NapoleonHope M Olegario Emberson, [place] 12/30/13 [phone]

## 2013-12-30 NOTE — ED Notes (Signed)
Patient placed on Toco monitor.  Montez MoritaErin Hampton RN, at [facility] contacted for monitoring. Patient just to have doppler FHR and monitor and [facility].

## 2013-12-30 NOTE — ED Notes (Addendum)
Hope NP at bedside, pt states that she has been having chills and nausea this am as well and that these symptoms are the same as with her previous uti,

## 2013-12-30 NOTE — H&P (Signed)
Attestation of Attending Supervision of Obstetric Fellow: Evaluation and management procedures were performed by the Obstetric Fellow under my supervision and collaboration.  I have reviewed the Obstetric Fellow's note and chart, and I agree with the management and plan.  Chikita Dogan, MD, FACOG Attending Obstetrician & Gynecologist Faculty Practice, [facility]   

## 2013-12-30 NOTE — H&P (Signed)
Denise Sandoval is a 18 y.o. female G1P0 with IUP at [redacted]w[redacted]d transferred from [facility] for tx of pyelonephritis. Pt was tx for UTI with keflex 2 weeks ago for a pan sensitive e.coli. Pt woke this morning with severe right flank pain and fever with chills. Tmax at home 100.8. Afebrile here.  At [facility] pt was tx with ceftriaxone 1g x1 and given IV dialudid for pain. Pt is tolerating PO at this time.  [facility]are at [facility] since 8 wks  Prenatal History/Complications: UTI 4/29  Past Medical History: Past Medical History  Diagnosis Date  . Supervision of normal first pregnancy in first trimester 09/23/2013    Past Surgical History: History reviewed. No pertinent past surgical history.  Obstetrical History: OB History   Grav Para Term Preterm Abortions TAB SAB Ect Mult Living   1               Gynecological History: OB History   Grav Para Term Preterm Abortions TAB SAB Ect Mult Living   1               Social History: History   Social History  . Marital Status: Single    Spouse Name: N/A    Number of Children: N/A  . Years of Education: N/A   Social History Main Topics  . Smoking status: Former Games developer  . Smokeless tobacco: Never Used  . Alcohol Use: No  . Drug Use: No     Comment: none since + preg test  . Sexual Activity: Yes    Birth Control/ Protection: None   Other Topics Concern  . None   Social History Narrative  . None    Family History: Family History  Problem Relation Age of Onset  . Alcohol abuse Mother   . Alcohol abuse Father   . Hypertension Maternal Grandmother   . Alcohol abuse Maternal Grandfather     Allergies: No Known Allergies  Prescriptions prior to admission  Medication Sig Dispense Refill  . prenatal vitamin w/FE, FA (PRENATAL 1 + 1) 27-1 MG TABS tablet Take 1 tablet by mouth daily at 12 noon.  30 each  11  . cephALEXin (KEFLEX) 500 MG capsule Take 1 capsule (500 mg total) by mouth 4 (four) times daily.  28 capsule  0     Review of  Systems   Constitutional: no headaches, vision changes, CP, sob, n/v, d/c, discharge. No other complaints at this time. No LOF, no vb, no ctx, normal fetal movement.  Blood pressure 110/51, pulse 81, temperature 98.6 F (37 C), temperature source Oral, resp. rate 18, height 5\' 8"  (1.727 m), weight 67.132 kg (148 lb), last menstrual period 07/22/2013, SpO2 100.00%. General appearance: alert, cooperative, appears stated age and no distress Lungs: clear to auscultation bilaterally Heart: regular rate and rhythm Abdomen: soft, non-tender; bowel sounds normal. Right CVAT with chandelier sign Pelvic: not examined Extremities: Homans sign is negative, no sign of DVT  Fetal monitoringBaseline: 152 bpm  Prenatal labs: ABO, Rh: O/NEG/-- (02/03 1536) Antibody: NEG (02/03 1536) Rubella:   RPR: NON REAC (02/03 1536)  HBsAg: NEGATIVE (02/03 1536)  HIV: NON REACTIVE (02/03 1536)  GBS:      [facility]  FOB dustin coffman 18 yo wm  Pap   GC/CT Initial:    Negative 09/23/13            36+wks:  Genetic Screen NT/IT:  AFP negative  CF screen negative  Anatomic US  Normal US  Flu vaccine Declined 09/23/13 but will think about it  Tdap Recommended ~ 28wks  Glucose Screen  2 hr  GBS   Feed Preference   Contraception   Circumcision   Childbirth Classes   Pediatrician       Prenatal Transfer Tool  Maternal Diabetes: Not tested yet Genetic Screening: Normal Maternal Ultrasounds/Referrals: Normal Fetal Ultrasounds or other Referrals:  None Maternal Substance Abuse:  No Significant Maternal Medications:  None Significant Maternal Lab Results: None     Results for orders placed during the hospital encounter of 12/30/13 (from the past 24 hour(s))  URINALYSIS, ROUTINE W REFLEX MICROSCOPIC   Collection Time    12/30/13  1:00 PM      Result Value Ref Range   Color, Urine STRAW (*) YELLOW   APPearance CLOUDY (*) CLEAR   Specific Gravity, Urine 1.015  1.005 - 1.030    pH 7.5  5.0 - 8.0   Glucose, UA NEGATIVE  NEGATIVE mg/dL   Hgb urine dipstick LARGE (*) NEGATIVE   Bilirubin Urine NEGATIVE  NEGATIVE   Ketones, ur NEGATIVE  NEGATIVE mg/dL   Protein, ur 30 (*) NEGATIVE mg/dL   Urobilinogen, UA 0.2  0.0 - 1.0 mg/dL   Nitrite NEGATIVE  NEGATIVE   Leukocytes, UA LARGE (*) NEGATIVE  COMPREHENSIVE METABOLIC PANEL   Collection Time    12/30/13  1:00 PM      Result Value Ref Range   Sodium 134 (*) 137 - 147 mEq/L   Potassium 4.0  3.7 - 5.3 mEq/L   Chloride 99  96 - 112 mEq/L   CO2 23  19 - 32 mEq/L   Glucose, Bld 77  70 - 99 mg/dL   BUN 5 (*) 6 - 23 mg/dL   Creatinine, Ser 8.110.52  0.50 - 1.10 mg/dL   Calcium 8.8  8.4 - 91.410.5 mg/dL   Total Protein 6.7  6.0 - 8.3 g/dL   Albumin 3.2 (*) 3.5 - 5.2 g/dL   AST 14  0 - 37 U/L   ALT 9  0 - 35 U/L   Alkaline Phosphatase 51  39 - 117 U/L   Total Bilirubin 0.2 (*) 0.3 - 1.2 mg/dL   GFR calc non Af Amer >90  >90 mL/min   GFR calc Af Amer >90  >90 mL/min  URINE MICROSCOPIC-ADD ON   Collection Time    12/30/13  1:00 PM      Result Value Ref Range   Squamous Epithelial / LPF MANY (*) RARE   WBC, UA TOO NUMEROUS TO COUNT  <3 WBC/hpf   RBC / HPF 21-50  <3 RBC/hpf   Bacteria, UA MANY (*) RARE  CBC WITH DIFFERENTIAL   Collection Time    12/30/13  1:15 PM      Result Value Ref Range   WBC 17.0 (*) 4.0 - 10.5 K/uL   RBC 3.34 (*) 3.87 - 5.11 MIL/uL   Hemoglobin 10.7 (*) 12.0 - 15.0 g/dL   HCT 78.231.2 (*) 95.636.0 - 21.346.0 %   MCV 93.4  78.0 - 100.0 fL   MCH 32.0  26.0 - 34.0 pg   MCHC 34.3  30.0 - 36.0 g/dL   RDW 08.612.8  57.811.5 - 46.915.5 %   Platelets 261  150 - 400 K/uL   Neutrophils Relative % 85 (*) 43 - 77 %   Neutro Abs 14.4 (*) 1.7 - 7.7 K/uL   Lymphocytes Relative  10 (*) 12 - 46 %   Lymphs Abs 1.7  0.7 - 4.0 K/uL   Monocytes Relative 5  3 - 12 %   Monocytes Absolute 0.9  0.1 - 1.0 K/uL   Eosinophils Relative 0  0 - 5 %   Eosinophils Absolute 0.0  0.0 - 0.7 K/uL   Basophils Relative 0  0 - 1 %   Basophils Absolute  0.0  0.0 - 0.1 K/uL    Assessment: Melanie CrazierKaitlyn Fryberger is a 18 y.o. G1P0 at 6087w1d by L=7 here for pyelonephritis  #pyelonephritis:  - Ceftriaxone 1g q12h - When able transition to bactrim - percocet PRN pain - NS TKO - [facility] [place] [facility], will follow up  #FWB: - stable fetal HR - monitor q8 hrs by doppler, Previable  FEN: NS TKO/Replete PRN/ Reg diet  Dispo: pending response to Pyelonephritis tx  Full code  Minta BalsamMichael R Leotha Westermeyer 12/30/2013, 5:23 PM

## 2013-12-30 NOTE — Telephone Encounter (Signed)
Spoke with pt. [facility] was at [facility]. Pt states she is having severe backpain, in right kidney area. + fever this am. Had a recent UTI, and feels like it may be back. I offered an appt for pt here at office this afternoon, but pt wanted to stay at ER since she is "hurting so bad". JSY

## 2013-12-30 NOTE — ED Notes (Signed)
Fetal heart tones, RLQ 152

## 2013-12-30 NOTE — ED Notes (Signed)
Patient ambulatory to restroom with steady gait, assisted back to room

## 2013-12-30 NOTE — ED Notes (Signed)
[facility] arrived at this time to transport pt.

## 2013-12-30 NOTE — ED Notes (Signed)
Called lab to ask time frame on urine results, Lab advised that the urine was being worked on,

## 2013-12-30 NOTE — ED Notes (Signed)
[facility] [phone] who advised that pt is going to room 318, [facility] notified of room assignment, advised that it would be at least a hour before they would be able to transport pt,

## 2013-12-30 NOTE — ED Notes (Signed)
Pt c/o right flank pain, fever that started this am, denies any vaginal discharge, fluid leakage, n/v, recently treated for uti, pt is currently 22 weeks 1 day pregnant with EDD 05/04/2014, [facility],

## 2013-12-30 NOTE — ED Notes (Signed)
Meal tray given 

## 2013-12-31 LAB — URINE CULTURE
COLONY COUNT: NO GROWTH
CULTURE: NO GROWTH

## 2013-12-31 MED ORDER — ONDANSETRON 4 MG PO TBDP
4.0000 mg | ORAL_TABLET | Freq: Four times a day (QID) | ORAL | Status: DC | PRN
  Administered 2013-12-31: 4 mg via ORAL
  Filled 2013-12-31: qty 1

## 2013-12-31 NOTE — ED Provider Notes (Signed)
Medical screening examination/treatment/procedure(s) were performed by non-physician practitioner and as supervising physician I was immediately available for consultation/collaboration.   EKG Interpretation None        Ottie Neglia F Zylan Almquist, MD 12/31/13 0831 

## 2013-12-31 NOTE — Progress Notes (Signed)
UR completed 

## 2013-12-31 NOTE — Progress Notes (Signed)
Patient ID: Denise Sandoval, female   DOB: [birth year], 18 y.o.   MRN: [id number] FACULTY PRACTICE ANTEPARTUM(COMPREHENSIVE) NOTE  Denise CrazierKaitlyn Sandoval is a 18 y.o. G1P0 at 6555w2d by early ultrasound who is admitted for pyelonephritis.   Fetal presentation is unsure. Length of Stay:  1  Days  Subjective: Less flank pain Patient reports the fetal movement as active. Patient reports uterine contraction  activity as none. Patient reports  vaginal bleeding as none. Patient describes fluid per vagina as None.  Vitals:  Blood pressure 96/46, pulse 70, temperature 98 F (36.7 C), temperature source Oral, resp. rate 18, height 5\' 8"  (1.727 m), weight 150 lb (68.04 kg), last menstrual period 07/22/2013, SpO2 100.00%. Physical Examination:  General appearance - alert, well appearing, and in no distress Heart - normal rate and regular rhythm Abdomen - soft, nontender, nondistended Fundal Height:  size equals dates Cervical Exam: Not evaluated. . Extremities: extremities normal  Membranes:intact  Fetal Monitoring:  FHR nl  Labs:  Results for orders placed during the hospital encounter of 12/30/13 (from the past 24 hour(s))  URINALYSIS, ROUTINE W REFLEX MICROSCOPIC   Collection Time    12/30/13  1:00 PM      Result Value Ref Range   Color, Urine STRAW (*) YELLOW   APPearance CLOUDY (*) CLEAR   Specific Gravity, Urine 1.015  1.005 - 1.030   pH 7.5  5.0 - 8.0   Glucose, UA NEGATIVE  NEGATIVE mg/dL   Hgb urine dipstick LARGE (*) NEGATIVE   Bilirubin Urine NEGATIVE  NEGATIVE   Ketones, ur NEGATIVE  NEGATIVE mg/dL   Protein, ur 30 (*) NEGATIVE mg/dL   Urobilinogen, UA 0.2  0.0 - 1.0 mg/dL   Nitrite NEGATIVE  NEGATIVE   Leukocytes, UA LARGE (*) NEGATIVE  COMPREHENSIVE METABOLIC PANEL   Collection Time    12/30/13  1:00 PM      Result Value Ref Range   Sodium 134 (*) 137 - 147 mEq/L   Potassium 4.0  3.7 - 5.3 mEq/L   Chloride 99  96 - 112 mEq/L   CO2 23  19 - 32 mEq/L   Glucose, Bld 77  70 - 99  mg/dL   BUN 5 (*) 6 - 23 mg/dL   Creatinine, Ser 4.090.52  0.50 - 1.10 mg/dL   Calcium 8.8  8.4 - 81.110.5 mg/dL   Total Protein 6.7  6.0 - 8.3 g/dL   Albumin 3.2 (*) 3.5 - 5.2 g/dL   AST 14  0 - 37 U/L   ALT 9  0 - 35 U/L   Alkaline Phosphatase 51  39 - 117 U/L   Total Bilirubin 0.2 (*) 0.3 - 1.2 mg/dL   GFR calc non Af Amer >90  >90 mL/min   GFR calc Af Amer >90  >90 mL/min  URINE MICROSCOPIC-ADD ON   Collection Time    12/30/13  1:00 PM      Result Value Ref Range   Squamous Epithelial / LPF MANY (*) RARE   WBC, UA TOO NUMEROUS TO COUNT  <3 WBC/hpf   RBC / HPF 21-50  <3 RBC/hpf   Bacteria, UA MANY (*) RARE  CBC WITH DIFFERENTIAL   Collection Time    12/30/13  1:15 PM      Result Value Ref Range   WBC 17.0 (*) 4.0 - 10.5 K/uL   RBC 3.34 (*) 3.87 - 5.11 MIL/uL   Hemoglobin 10.7 (*) 12.0 - 15.0 g/dL   HCT 91.431.2 (*) 78.236.0 - 95.646.0 %  MCV 93.4  78.0 - 100.0 fL   MCH 32.0  26.0 - 34.0 pg   MCHC 34.3  30.0 - 36.0 g/dL   RDW 16.112.8  09.611.5 - 04.515.5 %   Platelets 261  150 - 400 K/uL   Neutrophils Relative % 85 (*) 43 - 77 %   Neutro Abs 14.4 (*) 1.7 - 7.7 K/uL   Lymphocytes Relative 10 (*) 12 - 46 %   Lymphs Abs 1.7  0.7 - 4.0 K/uL   Monocytes Relative 5  3 - 12 %   Monocytes Absolute 0.9  0.1 - 1.0 K/uL   Eosinophils Relative 0  0 - 5 %   Eosinophils Absolute 0.0  0.0 - 0.7 K/uL   Basophils Relative 0  0 - 1 %   Basophils Absolute 0.0  0.0 - 0.1 K/uL      Medications:  Scheduled . cefTRIAXone (ROCEPHIN)  IV  1 g Intravenous Q12H  . docusate sodium  100 mg Oral Daily  . prenatal multivitamin  1 tablet Oral Q1200   I have reviewed the patient's current medications.  ASSESSMENT: Patient Active Problem List   Diagnosis Date Noted  . Pyelonephritis complicating pregnancy in second trimester 12/30/2013  . Supervision of normal first pregnancy in first trimester 09/23/2013  . TSS (toxic shock syndrome) 03/28/2012  . ARF (acute renal failure) 03/26/2012  . Fever of unknown origin  03/24/2012  . Tachycardia 03/24/2012  . Leukocytosis 03/24/2012  . Hypokalemia 03/24/2012  . Abnormal LFTs 03/24/2012    PLAN: Antibiotics IV at least 48 hr  Adam PhenixJames G Kadisha Goodine 12/31/2013,10:08 AM

## 2014-01-01 MED ORDER — FLUCONAZOLE 150 MG PO TABS
150.0000 mg | ORAL_TABLET | Freq: Once | ORAL | Status: AC
  Administered 2014-01-01: 150 mg via ORAL
  Filled 2014-01-01: qty 1

## 2014-01-01 NOTE — Progress Notes (Signed)
Patient ID: Denise Sandoval, female   DOB: [birth year], 18 y.o.   MRN: [id number] Patient ID: Denise Sandoval, female   DOB: [birth year], 18 y.o.   MRN: [id number] FACULTY PRACTICE ANTEPARTUM(COMPREHENSIVE) NOTE  Denise Sandoval is a 18 y.o. G1P0 at 6143w3d by early ultrasound who is admitted for pyelonephritis.   Fetal presentation is unsure. Length of Stay:  2  Days  Subjective: Less flank pain Patient reports the fetal movement as active. Patient reports uterine contraction  activity as none. Patient reports  vaginal bleeding as none. Patient describes fluid per vagina as None.  Vitals:  Blood pressure 95/45, pulse 62, temperature 98 F (36.7 C), temperature source Oral, resp. rate 16, height 5\' 8"  (1.727 m), weight 148 lb 8 oz (67.359 kg), last menstrual period 07/22/2013, SpO2 100.00%. Physical Examination:  General appearance - alert, well appearing, and in no distress Heart - normal rate and regular rhythm Abdomen - soft, nontender, nondistended Fundal Height:  size equals dates Cervical Exam: Not evaluated. . Extremities: extremities normal  Membranes:intact  Back + Right CVAT moderate Fetal Monitoring:  FHR nl  Labs:  No results found for this or any previous visit (from the past 24 hour(s)).    Medications:  Scheduled . cefTRIAXone (ROCEPHIN)  IV  1 g Intravenous Q12H  . docusate sodium  100 mg Oral Daily  . prenatal multivitamin  1 tablet Oral Q1200   I have reviewed the patient's current medications.  ASSESSMENT: 2043w3d Estimated Date of Delivery: 05/04/14 Pyelonephritis   Patient Active Problem List   Diagnosis Date Noted  . Pyelonephritis complicating pregnancy in second trimester 12/30/2013  . Supervision of normal first pregnancy in first trimester 09/23/2013  . TSS (toxic shock syndrome) 03/28/2012  . ARF (acute renal failure) 03/26/2012  . Fever of unknown origin 03/24/2012  . Tachycardia 03/24/2012  . Leukocytosis 03/24/2012  . Hypokalemia 03/24/2012  .  Abnormal LFTs 03/24/2012    PLAN: Antibiotics IV rocephin will need at least 24 hours more,l possibly 48 hours  Amaryllis DykeLuther H Eure 01/01/2014,7:29 AM

## 2014-01-02 DIAGNOSIS — O2390 Unspecified genitourinary tract infection in pregnancy, unspecified trimester: Principal | ICD-10-CM

## 2014-01-02 DIAGNOSIS — N12 Tubulo-interstitial nephritis, not specified as acute or chronic: Secondary | ICD-10-CM

## 2014-01-02 MED ORDER — OXYCODONE-ACETAMINOPHEN 5-325 MG PO TABS: 1.0000 | ORAL_TABLET | ORAL | Status: DC | PRN

## 2014-01-02 MED ORDER — CEPHALEXIN 500 MG PO CAPS: 500.0000 mg | ORAL_CAPSULE | Freq: Four times a day (QID) | ORAL | Status: DC

## 2014-01-02 NOTE — Discharge Summary (Signed)
Physician Discharge Summary  Patient ID: Denise Sandoval MRN: [id number] DOB/AGE: 1996/02/25 18 y.o.  Admit date: 12/30/2013 Discharge date: 01/02/2014  Admission Diagnoses: Pyelonephritis  Discharge Diagnoses:  Active Problems:   Pyelonephritis complicating pregnancy in second trimester   Discharged Condition: good  Hospital Course: Patient admitted secondary to sign and symptoms of pyelonephritis (fever and CVA tenderness). Patient received IV antibiotics and pain management as needed. She remained afebrile for greater than 48 hours and was deemed stable for discharge home on HD#3. Patient will be dicharged on keflex and will [facility] for continued [facility] care and prophylactic antibiotics.  Consults: None   Treatments: antibiotics: ceftriaxone  Discharge Exam: Blood pressure 100/45, pulse 67, temperature 98.1 F (36.7 C), temperature source Oral, resp. rate 18, height 5\' 8"  (1.727 m), weight 150 lb (68.04 kg), last menstrual period 07/22/2013, SpO2 100.00%. General appearance: alert, cooperative and no distress Resp: clear to auscultation bilaterally Cardio: regular rate and rhythm GI: soft, gravid, non-tender Extremities: extremities normal, atraumatic, no cyanosis or edema Back: no CVA tenderness Disposition: 01-Home or Self Care     Medication List         cephALEXin 500 MG capsule  Commonly known as:  KEFLEX  Take 1 capsule (500 mg total) by mouth 4 (four) times daily.     oxyCODONE-acetaminophen 5-325 MG per tablet  Commonly known as:  PERCOCET/ROXICET  Take 1-2 tablets by mouth every 4 (four) hours as needed for severe pain.     prenatal vitamin w/FE, FA 27-1 MG Tabs tablet  Take 1 tablet by mouth daily at 12 noon.       Follow-up Information   Follow up with [facility].   Contact information:   [address] [place] [id number] [phone]      Signed: Catalina Antiguaeggy Kasean Denherder 01/02/2014, 7:54 AM

## 2014-01-02 NOTE — Progress Notes (Signed)
Pt is discharged in the care of Aunt with [employer]. [employer]. States she feels better after receiving Pain med. Discharged instructions with Rx were given to [t. Questions were asked abnd answered. Stable. FHT tone 136.

## 2014-01-14 ENCOUNTER — Ambulatory Visit (INDEPENDENT_AMBULATORY_CARE_PROVIDER_SITE_OTHER): Payer: Medicaid Other | Admitting: Women's Health

## 2014-01-14 ENCOUNTER — Encounter: Payer: Self-pay | Admitting: Women's Health

## 2014-01-14 VITALS — BP 112/60 | Wt 153.0 lb

## 2014-01-14 DIAGNOSIS — Z2839 Other underimmunization status: Secondary | ICD-10-CM

## 2014-01-14 DIAGNOSIS — Z331 Pregnant state, incidental: Secondary | ICD-10-CM

## 2014-01-14 DIAGNOSIS — O360990 Maternal care for other rhesus isoimmunization, unspecified trimester, not applicable or unspecified: Secondary | ICD-10-CM

## 2014-01-14 DIAGNOSIS — F1290 Cannabis use, unspecified, uncomplicated: Secondary | ICD-10-CM

## 2014-01-14 DIAGNOSIS — O09899 Supervision of other high risk pregnancies, unspecified trimester: Secondary | ICD-10-CM | POA: Insufficient documentation

## 2014-01-14 DIAGNOSIS — O26899 Other specified pregnancy related conditions, unspecified trimester: Secondary | ICD-10-CM

## 2014-01-14 DIAGNOSIS — Z3400 Encounter for supervision of normal first pregnancy, unspecified trimester: Secondary | ICD-10-CM

## 2014-01-14 DIAGNOSIS — Z283 Underimmunization status: Secondary | ICD-10-CM

## 2014-01-14 DIAGNOSIS — Z1389 Encounter for screening for other disorder: Secondary | ICD-10-CM

## 2014-01-14 DIAGNOSIS — Z6791 Unspecified blood type, Rh negative: Secondary | ICD-10-CM | POA: Insufficient documentation

## 2014-01-14 DIAGNOSIS — O2390 Unspecified genitourinary tract infection in pregnancy, unspecified trimester: Secondary | ICD-10-CM

## 2014-01-14 DIAGNOSIS — O9989 Other specified diseases and conditions complicating pregnancy, childbirth and the puerperium: Secondary | ICD-10-CM

## 2014-01-14 DIAGNOSIS — N12 Tubulo-interstitial nephritis, not specified as acute or chronic: Secondary | ICD-10-CM

## 2014-01-14 DIAGNOSIS — O2302 Infections of kidney in pregnancy, second trimester: Secondary | ICD-10-CM

## 2014-01-14 DIAGNOSIS — Z3401 Encounter for supervision of normal first pregnancy, first trimester: Secondary | ICD-10-CM

## 2014-01-14 LAB — POCT URINALYSIS DIPSTICK
Blood, UA: NEGATIVE
GLUCOSE UA: NEGATIVE
KETONES UA: NEGATIVE
LEUKOCYTES UA: NEGATIVE
Nitrite, UA: NEGATIVE
PROTEIN UA: NEGATIVE

## 2014-01-14 MED ORDER — CEPHALEXIN 500 MG PO CAPS: 500.0000 mg | ORAL_CAPSULE | Freq: Four times a day (QID) | ORAL | Status: DC

## 2014-01-14 NOTE — Progress Notes (Signed)
Reports good fm. Denies uc's, lof, vb, uti s/s.  D/C'd from [facility] on 5/15 for pyelo, just finished up keflex last week. +congestion, sore throat, productive cough, Rt ear pain x 3d. Lt ear: cloudy erythematous TM, Rt ear normal, oropharynx normal, HRRR, LCTAB. Will do another course of keflex for ear, then q hs for pyelo suppression. Last [facility] 5/12, advised not to use anymore. Will recheck at least 30d from last use. Recommended signing up for cb classes asap. Reviewed ptl s/s, fm.  All questions answered. F/U in 4wks for pn2 and visit.

## 2014-01-14 NOTE — Patient Instructions (Signed)
[place] Pediatricians:  [facility] [phone]            [facility] [phone]                 [facility] [phone] (usually doesn't accept new patients unless you have family there already, you are always welcome to call and ask)             [facility] ([address] [place]) [phone]   [facility] Pediatricians:   [facility]: [phone]  [facility]: [phone]   You will have your sugar test next visit.  Please do not eat or drink anything after midnight the night before you come, not even water.  You will be here for at least two hours.     Second Trimester of Pregnancy The second trimester is from week 13 through week 28, months 4 through 6. The second trimester is often a time when you feel your best. Your body has also adjusted to being pregnant, and you begin to feel better physically. Usually, morning sickness has lessened or quit completely, you may have more energy, and you may have an increase in appetite. The second trimester is also a time when the fetus is growing rapidly. At the end of the sixth month, the fetus is about 9 inches long and weighs about 1 pounds. You will likely begin to feel the baby move (quickening) between 18 and 20 weeks of the pregnancy. BODY CHANGES Your body goes through many changes during pregnancy. The changes vary from woman to woman.   Your weight will continue to increase. You will notice your lower abdomen bulging out.  You may begin to get stretch marks on your hips, abdomen, and breasts.  You may develop headaches that can be relieved by medicines approved by your caregiver.  You may urinate more often because the fetus is pressing on your bladder.  You may develop or continue to have heartburn as a result of your pregnancy.  You may develop constipation because certain hormones are causing the muscles that push waste  through your intestines to slow down.  You may develop hemorrhoids or swollen, bulging veins (varicose veins).  You may have back pain because of the weight gain and pregnancy hormones relaxing your joints between the bones in your pelvis and as a result of a shift in weight and the muscles that support your balance.  Your breasts will continue to grow and be tender.  Your gums may bleed and may be sensitive to brushing and flossing.  Dark spots or blotches (chloasma, mask of pregnancy) may develop on your face. This will likely fade after the baby is born.  A dark line from your belly button to the pubic area (linea nigra) may appear. This will likely fade after the baby is born. WHAT TO EXPECT AT YOUR PRENATAL VISITS During a routine prenatal visit:  You will be weighed to make sure you and the fetus are growing normally.  Your blood pressure will be taken.  Your abdomen will be measured to track your baby's growth.  The fetal heartbeat will be listened to.  Any test results from the previous visit will be discussed. Your caregiver may ask you:  How you are feeling.  If you are feeling the baby move.  If you have had any abnormal symptoms, such as leaking fluid, bleeding, severe headaches, or abdominal cramping.  If you have any questions. Other tests that  that may be performed during your second trimester include:  Blood tests that check for:  Low iron levels (anemia).  Gestational diabetes (between 24 and 28 weeks).  Rh antibodies.  Urine tests to check for infections, diabetes, or protein in the urine.  An ultrasound to confirm the proper growth and development of the baby.  An amniocentesis to check for possible genetic problems.  Fetal screens for spina bifida and Down syndrome. HOME CARE INSTRUCTIONS   Avoid all smoking, herbs, alcohol, and unprescribed drugs. These chemicals affect the formation and growth of the baby.  Follow your caregiver's  instructions regarding medicine use. There are medicines that are either safe or unsafe to take during pregnancy.  Exercise only as directed by your caregiver. Experiencing uterine cramps is a good sign to stop exercising.  Continue to eat regular, healthy meals.  Wear a good support bra for breast tenderness.  Do not use hot tubs, steam rooms, or saunas.  Wear your seat belt at all times when driving.  Avoid raw meat, uncooked cheese, cat litter boxes, and soil used by cats. These carry germs that can cause birth defects in the baby.  Take your prenatal vitamins.  Try taking a stool softener (if your caregiver approves) if you develop constipation. Eat more high-fiber foods, such as fresh vegetables or fruit and whole grains. Drink plenty of fluids to keep your urine clear or pale yellow.  Take warm sitz baths to soothe any pain or discomfort caused by hemorrhoids. Use hemorrhoid cream if your caregiver approves.  If you develop varicose veins, wear support hose. Elevate your feet for 15 minutes, 3 4 times a day. Limit salt in your diet.  Avoid heavy lifting, wear low heel shoes, and practice good posture.  Rest with your legs elevated if you have leg cramps or low back pain.  Visit your dentist if you have not gone yet during your pregnancy. Use a soft toothbrush to brush your teeth and be gentle when you floss.  A sexual relationship may be continued unless your caregiver directs you otherwise.  Continue to go to all your prenatal visits as directed by your caregiver. SEEK MEDICAL CARE IF:   You have dizziness.  You have mild pelvic cramps, pelvic pressure, or nagging pain in the abdominal area.  You have persistent nausea, vomiting, or diarrhea.  You have a bad smelling vaginal discharge.  You have pain with urination. SEEK IMMEDIATE MEDICAL CARE IF:   You have a fever.  You are leaking fluid from your vagina.  You have spotting or bleeding from your vagina.  You  have severe abdominal cramping or pain.  You have rapid weight gain or loss.  You have shortness of breath with chest pain.  You notice sudden or extreme swelling of your face, hands, ankles, feet, or legs.  You have not felt your baby move in over an hour.  You have severe headaches that do not go away with medicine.  You have vision changes. Document Released: 08/01/2001 Document Revised: 04/09/2013 Document Reviewed: 10/08/2012 Exit[employer] Patient Information 2014 [employer].  

## 2014-02-11 ENCOUNTER — Ambulatory Visit (INDEPENDENT_AMBULATORY_CARE_PROVIDER_SITE_OTHER): Payer: Medicaid Other | Admitting: Advanced Practice Midwife

## 2014-02-11 ENCOUNTER — Other Ambulatory Visit: Payer: Medicaid Other

## 2014-02-11 ENCOUNTER — Encounter: Payer: Self-pay | Admitting: Advanced Practice Midwife

## 2014-02-11 VITALS — BP 100/60 | Wt 157.0 lb

## 2014-02-11 DIAGNOSIS — F1920 Other psychoactive substance dependence, uncomplicated: Secondary | ICD-10-CM

## 2014-02-11 DIAGNOSIS — F1290 Cannabis use, unspecified, uncomplicated: Secondary | ICD-10-CM

## 2014-02-11 DIAGNOSIS — Z3401 Encounter for supervision of normal first pregnancy, first trimester: Secondary | ICD-10-CM

## 2014-02-11 DIAGNOSIS — Z3403 Encounter for supervision of normal first pregnancy, third trimester: Secondary | ICD-10-CM

## 2014-02-11 DIAGNOSIS — O99320 Drug use complicating pregnancy, unspecified trimester: Secondary | ICD-10-CM

## 2014-02-11 DIAGNOSIS — Z331 Pregnant state, incidental: Secondary | ICD-10-CM

## 2014-02-11 DIAGNOSIS — Z1389 Encounter for screening for other disorder: Secondary | ICD-10-CM

## 2014-02-11 LAB — CBC
HCT: 29.8 % — ABNORMAL LOW (ref 36.0–46.0)
Hemoglobin: 10.2 g/dL — ABNORMAL LOW (ref 12.0–15.0)
MCH: 31.5 pg (ref 26.0–34.0)
MCHC: 34.2 g/dL (ref 30.0–36.0)
MCV: 92 fL (ref 78.0–100.0)
Platelets: 244 10*3/uL (ref 150–400)
RBC: 3.24 MIL/uL — ABNORMAL LOW (ref 3.87–5.11)
RDW: 12.4 % (ref 11.5–15.5)
WBC: 10.3 10*3/uL (ref 4.0–10.5)

## 2014-02-11 LAB — POCT URINALYSIS DIPSTICK
GLUCOSE UA: NEGATIVE
Ketones, UA: NEGATIVE
Nitrite, UA: NEGATIVE
Protein, UA: NEGATIVE
RBC UA: NEGATIVE

## 2014-02-11 NOTE — Progress Notes (Signed)
G1P0 6059w2d Estimated Date of Delivery: 05/04/14  Blood pressure 100/60, weight 157 lb (71.215 kg), last menstrual period 07/22/2013.   BP weight and urine results all reviewed and noted.  Please refer to the obstetrical flow sheet for the fundal height and fetal heart rate documentation: Doing PN2 today  Patient reports good fetal movement, denies any bleeding and no rupture of membranes symptoms or regular contractions. Patient is without complaints. All questions were answered.  Plan:  Continued routine obstetrical care, UDS  Follow up in 4 weeks for OB appointment,

## 2014-02-12 ENCOUNTER — Encounter: Payer: Self-pay | Admitting: Advanced Practice Midwife

## 2014-02-12 LAB — GLUCOSE TOLERANCE, 2 HOURS W/ 1HR
GLUCOSE, FASTING: 70 mg/dL (ref 70–99)
GLUCOSE: 99 mg/dL (ref 70–170)
Glucose, 2 hour: 66 mg/dL — ABNORMAL LOW (ref 70–139)

## 2014-02-12 LAB — DRUG SCREEN, URINE, NO CONFIRMATION
Amphetamine Screen, Ur: NEGATIVE
BARBITURATE QUANT UR: NEGATIVE
Benzodiazepines.: NEGATIVE
COCAINE METABOLITES: NEGATIVE
Creatinine,U: 88.9 mg/dL
MARIJUANA METABOLITE: POSITIVE — AB
Methadone: NEGATIVE
OPIATE SCREEN, URINE: NEGATIVE
Phencyclidine (PCP): NEGATIVE
Propoxyphene: NEGATIVE

## 2014-02-12 LAB — HIV ANTIBODY (ROUTINE TESTING W REFLEX): HIV 1&2 Ab, 4th Generation: NONREACTIVE

## 2014-02-12 LAB — RPR

## 2014-02-12 LAB — ANTIBODY SCREEN: ANTIBODY SCREEN: NEGATIVE

## 2014-02-13 LAB — HSV 2 ANTIBODY, IGG

## 2014-02-28 ENCOUNTER — Inpatient Hospital Stay (HOSPITAL_COMMUNITY)
Admission: AD | Admit: 2014-02-28 | Discharge: 2014-03-01 | DRG: 781 | Disposition: A | Discharge status: Home / Self Care | Payer: Medicaid Other | Source: Ambulatory Visit | Attending: Obstetrics and Gynecology | Admitting: Obstetrics and Gynecology

## 2014-02-28 ENCOUNTER — Encounter (HOSPITAL_COMMUNITY): Payer: Self-pay | Admitting: *Deleted

## 2014-02-28 DIAGNOSIS — Z8249 Family history of ischemic heart disease and other diseases of the circulatory system: Secondary | ICD-10-CM | POA: Diagnosis not present

## 2014-02-28 DIAGNOSIS — B373 Candidiasis of vulva and vagina: Secondary | ICD-10-CM | POA: Diagnosis present

## 2014-02-28 DIAGNOSIS — B3731 Acute candidiasis of vulva and vagina: Secondary | ICD-10-CM | POA: Diagnosis present

## 2014-02-28 DIAGNOSIS — N12 Tubulo-interstitial nephritis, not specified as acute or chronic: Secondary | ICD-10-CM | POA: Diagnosis present

## 2014-02-28 DIAGNOSIS — O2390 Unspecified genitourinary tract infection in pregnancy, unspecified trimester: Secondary | ICD-10-CM | POA: Diagnosis not present

## 2014-02-28 DIAGNOSIS — O98519 Other viral diseases complicating pregnancy, unspecified trimester: Secondary | ICD-10-CM | POA: Diagnosis present

## 2014-02-28 DIAGNOSIS — Z87440 Personal history of urinary (tract) infections: Secondary | ICD-10-CM | POA: Diagnosis present

## 2014-02-28 DIAGNOSIS — Z87891 Personal history of nicotine dependence: Secondary | ICD-10-CM

## 2014-02-28 DIAGNOSIS — O4700 False labor before 37 completed weeks of gestation, unspecified trimester: Secondary | ICD-10-CM | POA: Diagnosis present

## 2014-02-28 DIAGNOSIS — Z87448 Personal history of other diseases of urinary system: Secondary | ICD-10-CM | POA: Diagnosis present

## 2014-02-28 DIAGNOSIS — B069 Rubella without complication: Secondary | ICD-10-CM | POA: Diagnosis present

## 2014-02-28 DIAGNOSIS — O360990 Maternal care for other rhesus isoimmunization, unspecified trimester, not applicable or unspecified: Secondary | ICD-10-CM | POA: Diagnosis present

## 2014-02-28 LAB — CBC WITH DIFFERENTIAL/PLATELET
Basophils Absolute: 0.1 10*3/uL (ref 0.0–0.1)
Basophils Relative: 0 % (ref 0–1)
EOS PCT: 0 % (ref 0–5)
Eosinophils Absolute: 0 10*3/uL (ref 0.0–0.7)
HEMATOCRIT: 29.1 % — AB (ref 36.0–46.0)
Hemoglobin: 10 g/dL — ABNORMAL LOW (ref 12.0–15.0)
LYMPHS ABS: 1.4 10*3/uL (ref 0.7–4.0)
Lymphocytes Relative: 7 % — ABNORMAL LOW (ref 12–46)
MCH: 31.6 pg (ref 26.0–34.0)
MCHC: 34.4 g/dL (ref 30.0–36.0)
MCV: 92.1 fL (ref 78.0–100.0)
Monocytes Absolute: 1.1 10*3/uL — ABNORMAL HIGH (ref 0.1–1.0)
Monocytes Relative: 5 % (ref 3–12)
NEUTROS PCT: 88 % — AB (ref 43–77)
Neutro Abs: 17.2 10*3/uL — ABNORMAL HIGH (ref 1.7–7.7)
Platelets: 220 10*3/uL (ref 150–400)
RBC: 3.16 MIL/uL — AB (ref 3.87–5.11)
RDW: 12.4 % (ref 11.5–15.5)
WBC: 19.6 10*3/uL — ABNORMAL HIGH (ref 4.0–10.5)

## 2014-02-28 LAB — URINE MICROSCOPIC-ADD ON

## 2014-02-28 LAB — COMPREHENSIVE METABOLIC PANEL
ALT: 11 U/L (ref 0–35)
ANION GAP: 11 (ref 5–15)
AST: 14 U/L (ref 0–37)
Albumin: 2.9 g/dL — ABNORMAL LOW (ref 3.5–5.2)
Alkaline Phosphatase: 88 U/L (ref 39–117)
BUN: 6 mg/dL (ref 6–23)
CO2: 24 mEq/L (ref 19–32)
Calcium: 8.7 mg/dL (ref 8.4–10.5)
Chloride: 102 mEq/L (ref 96–112)
Creatinine, Ser: 0.55 mg/dL (ref 0.50–1.10)
GFR calc non Af Amer: >90 mL/min (ref >90)
Glucose, Bld: 82 mg/dL (ref 70–99)
POTASSIUM: 4.1 meq/L (ref 3.7–5.3)
Sodium: 137 mEq/L (ref 137–147)
TOTAL PROTEIN: 6.1 g/dL (ref 6.0–8.3)
Total Bilirubin: <0.2 mg/dL — ABNORMAL LOW (ref 0.3–1.2)

## 2014-02-28 LAB — URINALYSIS, ROUTINE W REFLEX MICROSCOPIC
BILIRUBIN URINE: NEGATIVE
GLUCOSE, UA: NEGATIVE mg/dL
Ketones, ur: NEGATIVE mg/dL
Nitrite: POSITIVE — AB
PH: 6 (ref 5.0–8.0)
Protein, ur: 100 mg/dL — AB
Specific Gravity, Urine: 1.02 (ref 1.005–1.030)
Urobilinogen, UA: 0.2 mg/dL (ref 0.0–1.0)

## 2014-02-28 LAB — WET PREP, GENITAL: Trich, Wet Prep: NONE SEEN

## 2014-02-28 MED ORDER — ACETAMINOPHEN 325 MG PO TABS: 650.0000 mg | ORAL_TABLET | ORAL | Status: DC | PRN

## 2014-02-28 MED ORDER — HYDROMORPHONE HCL PF 1 MG/ML IJ SOLN
1.0000 mg | INTRAMUSCULAR | Status: DC | PRN
  Administered 2014-02-28 (×3): 1 mg via INTRAVENOUS
  Filled 2014-02-28 (×3): qty 1

## 2014-02-28 MED ORDER — DOCUSATE SODIUM 100 MG PO CAPS
100.0000 mg | ORAL_CAPSULE | Freq: Every day | ORAL | Status: DC
  Administered 2014-03-01: 100 mg via ORAL
  Filled 2014-02-28 (×2): qty 1

## 2014-02-28 MED ORDER — LACTATED RINGERS IV SOLN
INTRAVENOUS | Status: DC
  Administered 2014-02-28: 10:00:00 via INTRAVENOUS

## 2014-02-28 MED ORDER — HYDROMORPHONE HCL PF 1 MG/ML IJ SOLN
1.0000 mg | Freq: Once | INTRAMUSCULAR | Status: AC
  Administered 2014-02-28: 1 mg via INTRAMUSCULAR
  Filled 2014-02-28: qty 1

## 2014-02-28 MED ORDER — PROMETHAZINE HCL 25 MG/ML IJ SOLN
12.5000 mg | INTRAMUSCULAR | Status: DC | PRN
  Administered 2014-02-28 (×2): 25 mg via INTRAVENOUS
  Filled 2014-02-28 (×2): qty 1

## 2014-02-28 MED ORDER — SODIUM CHLORIDE 0.9 % IV SOLN
INTRAVENOUS | Status: DC
  Administered 2014-02-28 – 2014-03-01 (×2): via INTRAVENOUS

## 2014-02-28 MED ORDER — DEXTROSE 5 % IV SOLN
1.0000 g | Freq: Two times a day (BID) | INTRAVENOUS | Status: DC
  Administered 2014-02-28 – 2014-03-01 (×2): 1 g via INTRAVENOUS
  Filled 2014-02-28 (×2): qty 10

## 2014-02-28 MED ORDER — DEXTROSE 5 % IV SOLN
1.0000 g | INTRAVENOUS | Status: AC
  Administered 2014-02-28: 1 g via INTRAVENOUS
  Filled 2014-02-28: qty 10

## 2014-02-28 MED ORDER — OXYCODONE-ACETAMINOPHEN 5-325 MG PO TABS
1.0000 | ORAL_TABLET | Freq: Four times a day (QID) | ORAL | Status: DC | PRN
Start: 2014-02-28 — End: 2014-03-01

## 2014-02-28 MED ORDER — CALCIUM CARBONATE ANTACID 500 MG PO CHEW
2.0000 | CHEWABLE_TABLET | ORAL | Status: DC | PRN
Start: 2014-02-28 — End: 2014-03-01
  Filled 2014-02-28: qty 2

## 2014-02-28 MED ORDER — ZOLPIDEM TARTRATE 5 MG PO TABS: 5.0000 mg | ORAL_TABLET | Freq: Every evening | ORAL | Status: DC | PRN

## 2014-02-28 MED ORDER — PRENATAL MULTIVITAMIN CH
1.0000 | ORAL_TABLET | Freq: Every day | ORAL | Status: DC
  Administered 2014-03-01: 1 via ORAL
  Filled 2014-02-28 (×2): qty 1

## 2014-02-28 NOTE — MAU Note (Signed)
Pt was admitted to antenatal 02-11-14 for pyleo, states that she is having pain in the lower right side or her back.

## 2014-02-28 NOTE — MAU Provider Note (Signed)
Attestation of Attending Supervision of Advanced Practitioner: Evaluation and management procedures were performed by the PA/NP/CNM/OB Fellow under my supervision/collaboration. Chart reviewed and agree with management and plan.  Tilda BurrowFERGUSON,Raequan Vanschaick V 02/28/2014 7:38 PM

## 2014-02-28 NOTE — H&P (Signed)
`````  Attestation of Attending Supervision of Advanced Practitioner: Evaluation and management procedures were performed by the PA/NP/CNM/OB Fellow under my supervision/collaboration. Chart reviewed and agree with management and plan.  Teya Otterson V 02/28/2014 1:22 PM

## 2014-02-28 NOTE — MAU Provider Note (Signed)
History     CSN: [id number]  Arrival date and time: 02/28/14 [id number]   First Provider Initiated Contact with Patient 02/28/14 [phone]      Chief Complaint  Patient presents with  . Flank Pain   HPI Ms. Denise Sandoval is a 18 y.o. G1P0 at 1840w5d who presents to MAU today with right flank pain. The patient was admitted to MAU on 02/11/14 to antenatal for pyelonephritis. She states that the pain started overnight last night. She rates her pain at 8/10 now. She denies dysuria or hematuria, but states urinary urgency and frequency. She is also having a thin, white vaginal discharge, but denies vaginal bleeding or LOF. She denies vaginal itching or irritation. She is having some nausea without vomiting. She denies fever or contractions. She states good fetal movement.   OB History   Grav Para Term Preterm Abortions TAB SAB Ect Mult Living   1               Past Medical History  Diagnosis Date  . Supervision of normal first pregnancy in first trimester 09/23/2013  . Kidney infection     History reviewed. No pertinent past surgical history.  Family History  Problem Relation Age of Onset  . Alcohol abuse Mother   . Alcohol abuse Father   . Hypertension Maternal Grandmother   . Alcohol abuse Maternal Grandfather     History  Substance Use Topics  . Smoking status: Former Games developermoker  . Smokeless tobacco: Never Used  . Alcohol Use: No    Allergies: No Known Allergies  Prescriptions prior to admission  Medication Sig Dispense Refill  . cephALEXin (KEFLEX) 500 MG capsule Take 1 capsule (500 mg total) by mouth 4 (four) times daily. X 7 days, then 1 qhs  30 capsule  4  . Prenatal Vit-Fe Fumarate-FA (PRENATAL MULTIVITAMIN) TABS tablet Take 1 tablet by mouth daily at 12 noon.        Review of Systems  Constitutional: Negative for fever and malaise/fatigue.  Gastrointestinal: Positive for nausea. Negative for vomiting and abdominal pain.  Genitourinary: Positive for urgency, frequency and  flank pain. Negative for dysuria and hematuria.       Neg - vaginal bleeding, LOF + vaginal discharge   Physical Exam   Blood pressure 116/56, pulse 75, temperature 97.8 F (36.6 C), temperature source Oral, resp. rate 16, height 5\' 8"  (1.727 m), weight 163 lb (73.936 kg), last menstrual period 07/22/2013.  Physical Exam  Constitutional: She is oriented to person, place, and time. She appears well-developed and well-nourished. No distress.  HENT:  Head: Normocephalic.  Cardiovascular: Normal rate.   Respiratory: Effort normal.  GI: Soft. She exhibits no distension and no mass. There is no tenderness. There is CVA tenderness (right). There is no rebound and no guarding.  Neurological: She is alert and oriented to person, place, and time.  Skin: Skin is warm and dry. No erythema.  Psychiatric: She has a normal mood and affect.   Results for orders placed during the hospital encounter of 02/28/14 (from the past 24 hour(s))  URINALYSIS, ROUTINE W REFLEX MICROSCOPIC     Status: Abnormal   Collection Time    02/28/14  7:58 AM      Result Value Ref Range   Color, Urine YELLOW  YELLOW   APPearance CLEAR  CLEAR   Specific Gravity, Urine 1.020  1.005 - 1.030   pH 6.0  5.0 - 8.0   Glucose, UA NEGATIVE  NEGATIVE mg/dL  Hgb urine dipstick MODERATE (*) NEGATIVE   Bilirubin Urine NEGATIVE  NEGATIVE   Ketones, ur NEGATIVE  NEGATIVE mg/dL   Protein, ur 161 (*) NEGATIVE mg/dL   Urobilinogen, UA 0.2  0.0 - 1.0 mg/dL   Nitrite POSITIVE (*) NEGATIVE   Leukocytes, UA SMALL (*) NEGATIVE  URINE MICROSCOPIC-ADD ON     Status: None   Collection Time    02/28/14  7:58 AM      Result Value Ref Range   Squamous Epithelial / LPF RARE  RARE   WBC, UA TOO NUMEROUS TO COUNT  <3 WBC/hpf   RBC / HPF 11-20  <3 RBC/hpf   Bacteria, UA RARE  RARE   Urine-Other MUCOUS PRESENT    CBC WITH DIFFERENTIAL     Status: Abnormal   Collection Time    02/28/14  8:45 AM      Result Value Ref Range   WBC 19.6 (*) 4.0  - 10.5 K/uL   RBC 3.16 (*) 3.87 - 5.11 MIL/uL   Hemoglobin 10.0 (*) 12.0 - 15.0 g/dL   HCT 09.6 (*) 04.5 - 40.9 %   MCV 92.1  78.0 - 100.0 fL   MCH 31.6  26.0 - 34.0 pg   MCHC 34.4  30.0 - 36.0 g/dL   RDW 81.1  91.4 - 78.2 %   Platelets 220  150 - 400 K/uL   Neutrophils Relative % 88 (*) 43 - 77 %   Neutro Abs 17.2 (*) 1.7 - 7.7 K/uL   Lymphocytes Relative 7 (*) 12 - 46 %   Lymphs Abs 1.4  0.7 - 4.0 K/uL   Monocytes Relative 5  3 - 12 %   Monocytes Absolute 1.1 (*) 0.1 - 1.0 K/uL   Eosinophils Relative 0  0 - 5 %   Eosinophils Absolute 0.0  0.0 - 0.7 K/uL   Basophils Relative 0  0 - 1 %   Basophils Absolute 0.1  0.0 - 0.1 K/uL  WET PREP, GENITAL     Status: Abnormal   Collection Time    02/28/14  8:50 AM      Result Value Ref Range   Yeast Wet Prep HPF POC RARE (*) NONE SEEN   Trich, Wet Prep NONE SEEN  NONE SEEN   Clue Cells Wet Prep HPF POC FEW (*) NONE SEEN   WBC, Wet Prep HPF POC FEW (*) NONE SEEN   Fetal Monitoring: Baseline: 120 bpm, moderate variability, + accelerations, no decelerations Contractions: mild UI, no contractions  MAU Course  Procedures None  MDM UA, CBC, CMP and wet prep today 1 mg Dilaudid IM given in MAU for pain  Assessment and Plan  A: [facility]UP at [redacted]w[redacted]d Pyelonephritis Yeast vulvovaginitis  P: Care turned over to Dr. Ike Bene for admission to Antenatal for IV antibiotics  Freddi Starr, PA-C  02/28/2014, 9:15 AM

## 2014-02-28 NOTE — H&P (Signed)
Denise Sandoval is a 18 y.o. G1P0 at 4050w5d admitted for pyelonephritis. Pt has hx of pyelo and has been on keflex for suppressive therapy. Over last several days pt has been feeling under the weather. Then last night pt had acute onset of right sided back pain that was similar to prior episode of pyelo. Pt has had no HA, fevers, respiratory distress or abdominal pain. Pt does endorse increased urinary frequency and urinary pressure. No dysuria. No other complaints.   0  Patient reports the fetal movement as active. Patient reports uterine contraction  activity with 1 contraction today that was mildly painful. Patient denies vaginal bleeding Patient denies fluid per vagina  Patient Active Problem List   Diagnosis Date Noted  . Pyelonephritis affecting pregnancy in third trimester, antepartum 02/28/2014  . Marijuana use 01/14/2014  . Rubella non-immune status, antepartum 01/14/2014  . Rh negative state in antepartum period 01/14/2014  . Pyelonephritis complicating pregnancy in second trimester 12/30/2013  . Supervision of normal first pregnancy in first trimester 09/23/2013  . TSS (toxic shock syndrome) 03/28/2012  . ARF (acute renal failure) 03/26/2012  . Fever of unknown origin 03/24/2012  . Tachycardia 03/24/2012  . Leukocytosis 03/24/2012  . Hypokalemia 03/24/2012  . Abnormal LFTs 03/24/2012   Past Medical History: Past Medical History  Diagnosis Date  . Supervision of normal first pregnancy in first trimester 09/23/2013  . Kidney infection     Past Surgical History: History reviewed. No pertinent past surgical history.  Obstetrical History: OB History   Grav Para Term Preterm Abortions TAB SAB Ect Mult Living   1               Gynecological History: negative  Social History: History   Social History  . Marital Status: Single    Spouse Name: N/A    Number of Children: N/A  . Years of Education: N/A   Social History Main Topics  . Smoking status: Former Games developermoker   . Smokeless tobacco: Never Used  . Alcohol Use: No  . Drug Use: No     Comment: none since + preg test  . Sexual Activity: Yes    Birth Control/ Protection: None   Other Topics Concern  . None   Social History Narrative  . None    Family History: Family History  Problem Relation Age of Onset  . Alcohol abuse Mother   . Alcohol abuse Father   . Hypertension Maternal Grandmother   . Alcohol abuse Maternal Grandfather     Allergies: No Known Allergies  Prescriptions prior to admission  Medication Sig Dispense Refill  . cephALEXin (KEFLEX) 500 MG capsule Take 1 capsule (500 mg total) by mouth 4 (four) times daily. X 7 days, then 1 qhs  30 capsule  4  . Prenatal Vit-Fe Fumarate-FA (PRENATAL MULTIVITAMIN) TABS tablet Take 1 tablet by mouth daily at 12 noon.        Vitals:  Blood pressure 115/68, pulse 66, temperature 98.4 F (36.9 C), temperature source Oral, resp. rate 18, height 5\' 8"  (1.727 m), weight 73.936 kg (163 lb), last menstrual period 07/22/2013. Physical Examination:  General appearance - alert, well appearing, and in no distress Abdomen: gravid and fundal height  is size equals dates Pelvic Exam: normal external exam.  Cervix closed, thick and high, ballotable Extremities: extremities normal, atraumatic, no cyanosis or edema with DTRs 2+ bilaterally Membranes:intact Fetal Monitoring:Baseline: 130s bpm, Variability: Good {> 6 bpm), Accelerations: Reactive and Decelerations: Absent   Labs:  Results for  orders placed during the hospital encounter of 02/28/14 (from the past 24 hour(s))  URINALYSIS, ROUTINE W REFLEX MICROSCOPIC   Collection Time    02/28/14  7:58 AM      Result Value Ref Range   Color, Urine YELLOW  YELLOW   APPearance CLEAR  CLEAR   Specific Gravity, Urine 1.020  1.005 - 1.030   pH 6.0  5.0 - 8.0   Glucose, UA NEGATIVE  NEGATIVE mg/dL   Hgb urine dipstick MODERATE (*) NEGATIVE   Bilirubin Urine NEGATIVE  NEGATIVE   Ketones, ur NEGATIVE   NEGATIVE mg/dL   Protein, ur 295 (*) NEGATIVE mg/dL   Urobilinogen, UA 0.2  0.0 - 1.0 mg/dL   Nitrite POSITIVE (*) NEGATIVE   Leukocytes, UA SMALL (*) NEGATIVE  URINE MICROSCOPIC-ADD ON   Collection Time    02/28/14  7:58 AM      Result Value Ref Range   Squamous Epithelial / LPF RARE  RARE   WBC, UA TOO NUMEROUS TO COUNT  <3 WBC/hpf   RBC / HPF 11-20  <3 RBC/hpf   Bacteria, UA RARE  RARE   Urine-Other MUCOUS PRESENT    CBC WITH DIFFERENTIAL   Collection Time    02/28/14  8:45 AM      Result Value Ref Range   WBC 19.6 (*) 4.0 - 10.5 K/uL   RBC 3.16 (*) 3.87 - 5.11 MIL/uL   Hemoglobin 10.0 (*) 12.0 - 15.0 g/dL   HCT 62.1 (*) 30.8 - 65.7 %   MCV 92.1  78.0 - 100.0 fL   MCH 31.6  26.0 - 34.0 pg   MCHC 34.4  30.0 - 36.0 g/dL   RDW 84.6  96.2 - 95.2 %   Platelets 220  150 - 400 K/uL   Neutrophils Relative % 88 (*) 43 - 77 %   Neutro Abs 17.2 (*) 1.7 - 7.7 K/uL   Lymphocytes Relative 7 (*) 12 - 46 %   Lymphs Abs 1.4  0.7 - 4.0 K/uL   Monocytes Relative 5  3 - 12 %   Monocytes Absolute 1.1 (*) 0.1 - 1.0 K/uL   Eosinophils Relative 0  0 - 5 %   Eosinophils Absolute 0.0  0.0 - 0.7 K/uL   Basophils Relative 0  0 - 1 %   Basophils Absolute 0.1  0.0 - 0.1 K/uL  COMPREHENSIVE METABOLIC PANEL   Collection Time    02/28/14  8:45 AM      Result Value Ref Range   Sodium 137  137 - 147 mEq/L   Potassium 4.1  3.7 - 5.3 mEq/L   Chloride 102  96 - 112 mEq/L   CO2 24  19 - 32 mEq/L   Glucose, Bld 82  70 - 99 mg/dL   BUN 6  6 - 23 mg/dL   Creatinine, Ser 8.41  0.50 - 1.10 mg/dL   Calcium 8.7  8.4 - 32.4 mg/dL   Total Protein 6.1  6.0 - 8.3 g/dL   Albumin 2.9 (*) 3.5 - 5.2 g/dL   AST 14  0 - 37 U/L   ALT 11  0 - 35 U/L   Alkaline Phosphatase 88  39 - 117 U/L   Total Bilirubin <0.2 (*) 0.3 - 1.2 mg/dL   GFR calc non Af Amer >90  >90 mL/min   GFR calc Af Amer >90  >90 mL/min   Anion gap 11  5 - 15  WET PREP, GENITAL   Collection Time  02/28/14  8:50 AM      Result Value Ref Range    Yeast Wet Prep HPF POC RARE (*) NONE SEEN   Trich, Wet Prep NONE SEEN  NONE SEEN   Clue Cells Wet Prep HPF POC FEW (*) NONE SEEN   WBC, Wet Prep HPF POC FEW (*) NONE SEEN    Imaging Studies: No results found.  . cefTRIAXone (ROCEPHIN)  IV  1 g Intravenous Q12H  . docusate sodium  100 mg Oral Daily  . prenatal multivitamin  1 tablet Oral Q1200   I have reviewed the patient's current medications.   ASSESSMENT: Patient Active Problem List   Diagnosis Date Noted  . Pyelonephritis affecting pregnancy in third trimester, antepartum 02/28/2014  . Marijuana use 01/14/2014  . Rubella non-immune status, antepartum 01/14/2014  . Rh negative state in antepartum period 01/14/2014  . Pyelonephritis complicating pregnancy in second trimester 12/30/2013  . Supervision of normal first pregnancy in first trimester 09/23/2013  . TSS (toxic shock syndrome) 03/28/2012  . ARF (acute renal failure) 03/26/2012  . Fever of unknown origin 03/24/2012  . Tachycardia 03/24/2012  . Leukocytosis 03/24/2012  . Hypokalemia 03/24/2012  . Abnormal LFTs 03/24/2012   A/P Harlei Lehrmann is a 18 y.o. G1P0 at [redacted]w[redacted]d by L=7 presents with recurrent Pyelonephritis.  #Pyelonephritis - no evidence of systemic effects. Lungs clear.  - ceftriaxone 1g BID - repeat CBC in AM - percocet for pain control. Dilaudid IV PRN - f/u Urine Cx  #FWB: Reactive and reassuring - qshift NST - closed cervix  Dispo: 48h antibiotics. Will need alternative Abx at discharge.   Tawana Scale, MD OB Fellow

## 2014-02-28 NOTE — MAU Note (Signed)
Pt states was admitted a month ago for kidney infection. Here this am with same s/s. Denies burning with voiding. Having back pain.

## 2014-03-01 DIAGNOSIS — B373 Candidiasis of vulva and vagina: Secondary | ICD-10-CM

## 2014-03-01 DIAGNOSIS — N12 Tubulo-interstitial nephritis, not specified as acute or chronic: Secondary | ICD-10-CM

## 2014-03-01 DIAGNOSIS — O4700 False labor before 37 completed weeks of gestation, unspecified trimester: Secondary | ICD-10-CM

## 2014-03-01 DIAGNOSIS — O2390 Unspecified genitourinary tract infection in pregnancy, unspecified trimester: Principal | ICD-10-CM

## 2014-03-01 DIAGNOSIS — B3731 Acute candidiasis of vulva and vagina: Secondary | ICD-10-CM

## 2014-03-01 LAB — CBC
HCT: 25.8 % — ABNORMAL LOW (ref 36.0–46.0)
Hemoglobin: 8.9 g/dL — ABNORMAL LOW (ref 12.0–15.0)
MCH: 32 pg (ref 26.0–34.0)
MCHC: 34.5 g/dL (ref 30.0–36.0)
MCV: 92.8 fL (ref 78.0–100.0)
Platelets: 206 10*3/uL (ref 150–400)
RBC: 2.78 MIL/uL — ABNORMAL LOW (ref 3.87–5.11)
RDW: 12.6 % (ref 11.5–15.5)
WBC: 13 10*3/uL — ABNORMAL HIGH (ref 4.0–10.5)

## 2014-03-01 LAB — CULTURE, OB URINE

## 2014-03-01 MED ORDER — NITROFURANTOIN MONOHYD MACRO 100 MG PO CAPS: ORAL_CAPSULE | ORAL | Status: DC

## 2014-03-01 NOTE — Discharge Instructions (Signed)
Pyelonephritis, Adult °Pyelonephritis is a kidney infection. In general, there are 2 main types of pyelonephritis: °· Infections that come on quickly without any warning (acute pyelonephritis). °· Infections that persist for a long period of time (chronic pyelonephritis). °CAUSES  °Two main causes of pyelonephritis are: °· Bacteria traveling from the bladder to the kidney. This is a problem especially in pregnant women. The urine in the bladder can become filled with bacteria from multiple causes, including: °¨ Inflammation of the prostate gland (prostatitis). °¨ Sexual intercourse in females. °¨ Bladder infection (cystitis). °· Bacteria traveling from the bloodstream to the tissue part of the kidney. °Problems that may increase your risk of getting a kidney infection include: °· Diabetes. °· Kidney stones or bladder stones. °· Cancer. °· Catheters placed in the bladder. °· Other abnormalities of the kidney or ureter. °SYMPTOMS  °· Abdominal pain. °· Pain in the side or flank area. °· Fever. °· Chills. °· Upset stomach. °· Blood in the urine (dark urine). °· Frequent urination. °· Strong or persistent urge to urinate. °· Burning or stinging when urinating. °DIAGNOSIS  °Your caregiver may diagnose your kidney infection based on your symptoms. A urine sample may also be taken. °TREATMENT  °In general, treatment depends on how severe the infection is.  °· If the infection is mild and caught early, your caregiver may treat you with oral antibiotics and send you home. °· If the infection is more severe, the bacteria may have gotten into the bloodstream. This will require intravenous (IV) antibiotics and a hospital stay. Symptoms may include: °¨ High fever. °¨ Severe flank pain. °¨ Shaking chills. °· Even after a hospital stay, your caregiver may require you to be on oral antibiotics for a period of time. °· Other treatments may be required depending upon the cause of the infection. °HOME CARE INSTRUCTIONS  °· Take your  antibiotics as directed. Finish them even if you start to feel better. °· Make an appointment to have your urine checked to make sure the infection is gone. °· Drink enough fluids to keep your urine clear or pale yellow. °· Take medicines for the bladder if you have urgency and frequency of urination as directed by your caregiver. °SEEK IMMEDIATE MEDICAL CARE IF:  °· You have a fever or persistent symptoms for more than 2-3 days. °· You have a fever and your symptoms suddenly get worse. °· You are unable to take your antibiotics or fluids. °· You develop shaking chills. °· You experience extreme weakness or fainting. °· There is no improvement after 2 days of treatment. °MAKE SURE YOU: °· Understand these instructions. °· Will watch your condition. °· Will get help right away if you are not doing well or get worse. °Document Released: 08/07/2005 Document Revised: 02/06/2012 Document Reviewed: 01/11/2011 °ExitCare® Patient Information ©2015 [employer]. This information is not intended to replace advice given to you by your health care provider. Make sure you discuss any questions you have with your health care provider. ° °

## 2014-03-01 NOTE — Discharge Summary (Signed)
Physician Discharge Summary  Patient ID: Denise CrazierKaitlyn Sandoval MRN: [id number] DOB/AGE: 04-03-1996 18 y.o.  Admit date: 02/28/2014 Discharge date: 03/01/2014  Admission Diagnoses: 30+ weeks EGA, Pyelonephritis  Discharge Diagnoses: same Active Problems:   Pyelonephritis affecting pregnancy in third trimester, antepartum   Discharged Condition: good  Hospital Course: She was admitted with pyelonephritis despite taking a daily keflex tablet. She was given IV rochephin q 12 hours and her pain rapidly resolved. She was requesting discharge on 03-01-14 and I agree with this plan of action.  Consults: None  Significant Diagnostic Studies: labs: her urine was indicative of a UTI  Treatments: antibiotics: rocephin  Discharge Exam: Blood pressure 103/49, pulse 74, temperature 98.7 F (37.1 C), temperature source Oral, resp. rate 20, height 5\' 8"  (1.727 m), weight 73.936 kg (163 lb), last menstrual period 07/22/2013. General appearance: alert Resp: clear to auscultation bilaterally Breasts: normal appearance, no masses or tenderness GI: soft, non-tender; bowel sounds normal; no masses,  no organomegaly Back: no CVAT  Disposition: 01-Home or Self Care     Medication List    STOP taking these medications       cephALEXin 500 MG capsule  Commonly known as:  KEFLEX      TAKE these medications       nitrofurantoin (macrocrystal-monohydrate) 100 MG capsule  Commonly known as:  MACROBID  Take 1 pill PO BID for 7 days and then 1 pill daily for the remainder of the pregnancy     prenatal multivitamin Tabs tablet  Take 1 tablet by mouth daily at 12 noon.           Follow-up Information   Please follow up. ([facility])       Signed: Juriel Cid C. 03/01/2014, 11:10 AM

## 2014-03-11 ENCOUNTER — Encounter: Payer: Self-pay | Admitting: Women's Health

## 2014-03-11 ENCOUNTER — Ambulatory Visit (INDEPENDENT_AMBULATORY_CARE_PROVIDER_SITE_OTHER): Payer: Medicaid Other | Admitting: Women's Health

## 2014-03-11 VITALS — BP 122/56 | Wt 161.0 lb

## 2014-03-11 DIAGNOSIS — O360131 Maternal care for anti-D [Rh] antibodies, third trimester, fetus 1: Secondary | ICD-10-CM

## 2014-03-11 DIAGNOSIS — Z331 Pregnant state, incidental: Secondary | ICD-10-CM

## 2014-03-11 DIAGNOSIS — O360990 Maternal care for other rhesus isoimmunization, unspecified trimester, not applicable or unspecified: Secondary | ICD-10-CM

## 2014-03-11 DIAGNOSIS — Z1389 Encounter for screening for other disorder: Secondary | ICD-10-CM

## 2014-03-11 DIAGNOSIS — Z6791 Unspecified blood type, Rh negative: Secondary | ICD-10-CM

## 2014-03-11 DIAGNOSIS — Z3401 Encounter for supervision of normal first pregnancy, first trimester: Secondary | ICD-10-CM

## 2014-03-11 LAB — POCT URINALYSIS DIPSTICK
Blood, UA: NEGATIVE
Ketones, UA: NEGATIVE
Nitrite, UA: NEGATIVE
PROTEIN UA: NEGATIVE

## 2014-03-11 MED ORDER — FUSION PLUS PO CAPS: 1.0000 | ORAL_CAPSULE | ORAL | Status: DC

## 2014-03-11 MED ORDER — RHO D IMMUNE GLOBULIN 1500 UNIT/2ML IJ SOSY
300.0000 ug | PREFILLED_SYRINGE | Freq: Once | INTRAMUSCULAR | Status: AC
  Administered 2014-03-11: 300 ug via INTRAMUSCULAR

## 2014-03-11 NOTE — Patient Instructions (Signed)

## 2014-03-11 NOTE — Progress Notes (Signed)
Low-risk OB appointment G1P0 3840w2d Estimated Date of Delivery: 05/04/14 BP 122/56  Wt 161 lb (73.029 kg)  LMP 07/22/2013  BP, weight, and urine reviewed.  Refer to obstetrical flow sheet for FH & FHR.  Reports good fm.  Denies regular uc's, lof, vb, or uti s/s. Feeling tired/fatigued, and like she's losing her color. Last Hgb 8.9, rx fusion plus today.  Was hospitalized again w/ pyelo couple of weeks ago- was switched to macrobid nightly suppression.  Hasn't made it to cb classes yet, likely too late- recommended calling today, if unavailable sign up for tour.  Reviewed pn2 results, ptl s/s, fkc. Plan:  Continue routine obstetrical care  F/U in 2wks for OB appointment

## 2014-03-25 ENCOUNTER — Ambulatory Visit (INDEPENDENT_AMBULATORY_CARE_PROVIDER_SITE_OTHER): Payer: Medicaid Other | Admitting: Advanced Practice Midwife

## 2014-03-25 VITALS — BP 122/60 | Wt 165.0 lb

## 2014-03-25 DIAGNOSIS — Z331 Pregnant state, incidental: Secondary | ICD-10-CM

## 2014-03-25 DIAGNOSIS — Z3400 Encounter for supervision of normal first pregnancy, unspecified trimester: Secondary | ICD-10-CM

## 2014-03-25 DIAGNOSIS — Z3403 Encounter for supervision of normal first pregnancy, third trimester: Secondary | ICD-10-CM

## 2014-03-25 DIAGNOSIS — Z1389 Encounter for screening for other disorder: Secondary | ICD-10-CM

## 2014-03-25 LAB — POCT URINALYSIS DIPSTICK
Glucose, UA: NEGATIVE
KETONES UA: NEGATIVE
Nitrite, UA: NEGATIVE
PROTEIN UA: NEGATIVE
RBC UA: NEGATIVE

## 2014-03-25 NOTE — Progress Notes (Signed)
G1P0 2151w2d Estimated Date of Delivery: 05/04/14  Blood pressure 122/60, weight 165 lb (74.844 kg), last menstrual period 07/22/2013.   BP weight and urine results all reviewed and noted.  Please refer to the obstetrical flow sheet for the fundal height and fetal heart rate documentation:  Patient reports good fetal movement, denies any bleeding and no rupture of membranes symptoms or regular contractions. Patient is without complaints. All questions were answered.  Plan:  Continued routine obstetrical care, macrobid qhs for pyelo suppression  Follow up in 2 weeks for OB appointment,

## 2014-04-08 ENCOUNTER — Encounter: Payer: Self-pay | Admitting: Women's Health

## 2014-04-08 ENCOUNTER — Ambulatory Visit (INDEPENDENT_AMBULATORY_CARE_PROVIDER_SITE_OTHER): Payer: Medicaid Other | Admitting: Women's Health

## 2014-04-08 VITALS — BP 124/58 | Wt 166.0 lb

## 2014-04-08 DIAGNOSIS — Z3401 Encounter for supervision of normal first pregnancy, first trimester: Secondary | ICD-10-CM

## 2014-04-08 DIAGNOSIS — Z3400 Encounter for supervision of normal first pregnancy, unspecified trimester: Secondary | ICD-10-CM

## 2014-04-08 DIAGNOSIS — Z1389 Encounter for screening for other disorder: Secondary | ICD-10-CM

## 2014-04-08 DIAGNOSIS — Z331 Pregnant state, incidental: Secondary | ICD-10-CM

## 2014-04-08 LAB — POCT URINALYSIS DIPSTICK
Glucose, UA: NEGATIVE
Ketones, UA: NEGATIVE
NITRITE UA: NEGATIVE
Protein, UA: NEGATIVE
RBC UA: NEGATIVE

## 2014-04-08 NOTE — Patient Instructions (Signed)
Call the office ([phone]) or go to [facility] if:  You begin to have strong, frequent contractions  Your water breaks.  Sometimes it is a big gush of fluid, sometimes it is just a trickle that keeps getting your panties wet or running down your legs  You have vaginal bleeding.  It is normal to have a small amount of spotting if your cervix was checked.   You don't feel your baby moving like normal.  If you don't, get you something to eat and drink and lay down and focus on feeling your baby move.  You should feel at least 10 movements in 2 hours.  If you don't, you should call the office or go to [facility].    Preterm Labor Information Preterm labor is when labor starts at less than 37 weeks of pregnancy. The normal length of a pregnancy is 39 to 41 weeks. CAUSES Often, there is no identifiable underlying cause as to why a woman goes into preterm labor. One of the most common known causes of preterm labor is infection. Infections of the uterus, cervix, vagina, amniotic sac, bladder, kidney, or even the lungs (pneumonia) can cause labor to start. Other suspected causes of preterm labor include:   Urogenital infections, such as yeast infections and bacterial vaginosis.   Uterine abnormalities (uterine shape, uterine septum, fibroids, or bleeding from the placenta).   A cervix that has been operated on (it may fail to stay closed).   Malformations in the fetus.   Multiple gestations (twins, triplets, and so on).   Breakage of the amniotic sac.  RISK FACTORS  Having a previous history of preterm labor.   Having premature rupture of membranes (PROM).   Having a placenta that covers the opening of the cervix (placenta previa).   Having a placenta that separates from the uterus (placental abruption).   Having a cervix that is too weak to hold the fetus in the uterus (incompetent cervix).   Having too much fluid in the amniotic sac (polyhydramnios).   Taking  illegal drugs or smoking while pregnant.   Not gaining enough weight while pregnant.   Being younger than 18 and older than 20 years old.   Having a low socioeconomic status.   Being African American. SYMPTOMS Signs and symptoms of preterm labor include:   Menstrual-like cramps, abdominal pain, or back pain.  Uterine contractions that are regular, as frequent as six in an hour, regardless of their intensity (may be mild or painful).  Contractions that start on the top of the uterus and spread down to the lower abdomen and back.   A sense of increased pelvic pressure.   A watery or bloody mucus discharge that comes from the vagina.  TREATMENT Depending on the length of the pregnancy and other circumstances, your health care provider may suggest bed rest. If necessary, there are medicines that can be given to stop contractions and to mature the fetal lungs. If labor happens before 34 weeks of pregnancy, a prolonged hospital stay may be recommended. Treatment depends on the condition of both you and the fetus.  WHAT SHOULD YOU DO IF YOU THINK YOU ARE IN PRETERM LABOR? Call your health care provider right away. You will need to go to the hospital to get checked immediately. HOW CAN YOU PREVENT PRETERM LABOR IN FUTURE PREGNANCIES? You should:   Stop smoking if you smoke.  Maintain healthy weight gain and avoid chemicals and drugs that are not necessary.  Be watchful for   any type of infection.  Inform your health care provider if you have a known history of preterm labor. Document Released: 10/28/2003 Document Revised: 04/09/2013 Document Reviewed: 09/09/2012 ExitCare Patient Information 2015 [employer]. This information is not intended to replace advice given to you by your health care provider. Make sure you discuss any questions you have with your health care provider.  

## 2014-04-08 NOTE — Progress Notes (Addendum)
Low-risk OB appointment G1P0 6418w2d Estimated Date of Delivery: 05/04/14 BP 124/58  Wt 166 lb (75.297 kg)  LMP 07/22/2013  BP, weight, and urine reviewed.  Refer to obstetrical flow sheet for FH & FHR.  Reports good fm.  Denies regular uc's, lof, vb, or uti s/s. Hip/leg cramps- recommended bananas, mustard, magnesium Not sleeping well- reviewed relief measures Reviewed ptl s/s, fkc. Plan:  Continue routine obstetrical care  F/U in 1wk for OB appointment and gbs Order nexplanon today

## 2014-04-16 ENCOUNTER — Encounter: Payer: Self-pay | Admitting: Advanced Practice Midwife

## 2014-04-16 ENCOUNTER — Ambulatory Visit (INDEPENDENT_AMBULATORY_CARE_PROVIDER_SITE_OTHER): Payer: Medicaid Other | Admitting: Advanced Practice Midwife

## 2014-04-16 VITALS — BP 110/68 | Wt 166.0 lb

## 2014-04-16 DIAGNOSIS — Z3685 Encounter for antenatal screening for Streptococcus B: Secondary | ICD-10-CM

## 2014-04-16 DIAGNOSIS — Z3403 Encounter for supervision of normal first pregnancy, third trimester: Secondary | ICD-10-CM

## 2014-04-16 DIAGNOSIS — O26843 Uterine size-date discrepancy, third trimester: Secondary | ICD-10-CM

## 2014-04-16 DIAGNOSIS — Z3400 Encounter for supervision of normal first pregnancy, unspecified trimester: Secondary | ICD-10-CM

## 2014-04-16 DIAGNOSIS — Z1389 Encounter for screening for other disorder: Secondary | ICD-10-CM

## 2014-04-16 DIAGNOSIS — Z1159 Encounter for screening for other viral diseases: Secondary | ICD-10-CM

## 2014-04-16 DIAGNOSIS — Z331 Pregnant state, incidental: Secondary | ICD-10-CM

## 2014-04-16 LAB — POCT URINALYSIS DIPSTICK
Blood, UA: NEGATIVE
GLUCOSE UA: NEGATIVE
KETONES UA: NEGATIVE
Leukocytes, UA: NEGATIVE
Nitrite, UA: NEGATIVE
Protein, UA: NEGATIVE

## 2014-04-16 LAB — OB RESULTS CONSOLE GC/CHLAMYDIA
Chlamydia: POSITIVE
GC PROBE AMP, GENITAL: NEGATIVE

## 2014-04-16 NOTE — Progress Notes (Signed)
Pt states that she has noticed more pain/pressure in her lower abdomen/pelvic area. Pt states that she is also having some trouble sleeping.

## 2014-04-16 NOTE — Progress Notes (Signed)
G1P0 [redacted]w[redacted]d Estimated Date of Delivery: 05/04/14  Blood pressure 110/68, weight 166 lb (75.297 kg), last menstrual period 07/22/2013.   BP weight and urine results all reviewed and noted.  Please refer to the obstetrical flow sheet for the fundal height and fetal heart rate documentation: FH has decreased, baby feels small  Patient reports good fetal movement, denies any bleeding and no rupture of membranes symptoms or regular contractions. Patient is without complaints. All questions were answered.  Plan:  Continued routine obstetrical care, GBS today  Follow up in ASAP for OB appointment, [place] for size<dates

## 2014-04-17 LAB — GC/CHLAMYDIA PROBE AMP
CT PROBE, AMP APTIMA: POSITIVE — AB
GC PROBE AMP APTIMA: NEGATIVE

## 2014-04-18 LAB — STREP B DNA PROBE: STREP GROUP B AG: DETECTED

## 2014-04-20 ENCOUNTER — Encounter: Payer: Self-pay | Admitting: Obstetrics and Gynecology

## 2014-04-20 ENCOUNTER — Ambulatory Visit (INDEPENDENT_AMBULATORY_CARE_PROVIDER_SITE_OTHER): Payer: Medicaid Other | Admitting: Obstetrics and Gynecology

## 2014-04-20 ENCOUNTER — Ambulatory Visit (INDEPENDENT_AMBULATORY_CARE_PROVIDER_SITE_OTHER): Payer: Medicaid Other

## 2014-04-20 VITALS — BP 110/70 | Wt 170.0 lb

## 2014-04-20 DIAGNOSIS — Z3400 Encounter for supervision of normal first pregnancy, unspecified trimester: Secondary | ICD-10-CM

## 2014-04-20 DIAGNOSIS — O26843 Uterine size-date discrepancy, third trimester: Secondary | ICD-10-CM

## 2014-04-20 DIAGNOSIS — Z3401 Encounter for supervision of normal first pregnancy, first trimester: Secondary | ICD-10-CM

## 2014-04-20 DIAGNOSIS — Z331 Pregnant state, incidental: Secondary | ICD-10-CM

## 2014-04-20 DIAGNOSIS — O26849 Uterine size-date discrepancy, unspecified trimester: Secondary | ICD-10-CM

## 2014-04-20 DIAGNOSIS — Z1389 Encounter for screening for other disorder: Secondary | ICD-10-CM

## 2014-04-20 LAB — POCT URINALYSIS DIPSTICK
Blood, UA: NEGATIVE
Glucose, UA: NEGATIVE
Ketones, UA: NEGATIVE
Leukocytes, UA: NEGATIVE
Nitrite, UA: NEGATIVE
PROTEIN UA: NEGATIVE

## 2014-04-20 NOTE — Patient Instructions (Signed)
Please check out [email or web address]   for more information on childbirth classes   

## 2014-04-20 NOTE — Progress Notes (Signed)
Pt denies any problems or concerns at this time.  

## 2014-04-20 NOTE — Progress Notes (Signed)
G1P0 [redacted]w[redacted]d Estimated Date of Delivery: 05/04/14   U/s for EFW due to s<Dates at last visit== EFW 7 lb,  54%ile. Blood pressure 110/70, weight 170 lb (77.111 kg), last menstrual period 07/22/2013.   refer to the ob flow sheet for FH and FHR, also BP, Wt, Urine results:notable for nothing abnormal   Patient reports good fetal movement, denies any bleeding and no rupture of membranes symptoms or regular contractions. Patient complaints: none at this time. Questions were answered.  Assess: AGA infant, 38 wk, no acute abnormalities noted Plan:  Continued routine obstetrical care,  F/u in 1 weeks for routine prenatal visit

## 2014-04-20 NOTE — Progress Notes (Signed)
U/S(38+0wks)-vtx active fetus, EFw 7 lb 6 oz (59th%tile), fluid WNL AFI-8.2cm, posterior Gr 2 placenta, FHR- 142 bpm, female fetus "Denise Sandoval"

## 2014-04-22 ENCOUNTER — Other Ambulatory Visit: Payer: Self-pay | Admitting: Advanced Practice Midwife

## 2014-04-22 ENCOUNTER — Encounter: Payer: Self-pay | Admitting: Advanced Practice Midwife

## 2014-04-22 DIAGNOSIS — O98819 Other maternal infectious and parasitic diseases complicating pregnancy, unspecified trimester: Secondary | ICD-10-CM

## 2014-04-22 DIAGNOSIS — A749 Chlamydial infection, unspecified: Secondary | ICD-10-CM | POA: Insufficient documentation

## 2014-04-22 MED ORDER — AZITHROMYCIN 500 MG PO TABS: 1000.0000 mg | ORAL_TABLET | Freq: Once | ORAL | Status: DC

## 2014-04-28 ENCOUNTER — Encounter: Payer: Self-pay | Admitting: Women's Health

## 2014-04-28 ENCOUNTER — Ambulatory Visit (INDEPENDENT_AMBULATORY_CARE_PROVIDER_SITE_OTHER): Payer: Medicaid Other | Admitting: Women's Health

## 2014-04-28 VITALS — BP 108/54 | Wt 173.0 lb

## 2014-04-28 DIAGNOSIS — Z331 Pregnant state, incidental: Secondary | ICD-10-CM

## 2014-04-28 DIAGNOSIS — Z3401 Encounter for supervision of normal first pregnancy, first trimester: Secondary | ICD-10-CM

## 2014-04-28 DIAGNOSIS — O98519 Other viral diseases complicating pregnancy, unspecified trimester: Secondary | ICD-10-CM

## 2014-04-28 DIAGNOSIS — Z1389 Encounter for screening for other disorder: Secondary | ICD-10-CM

## 2014-04-28 DIAGNOSIS — F1290 Cannabis use, unspecified, uncomplicated: Secondary | ICD-10-CM

## 2014-04-28 DIAGNOSIS — Z3400 Encounter for supervision of normal first pregnancy, unspecified trimester: Secondary | ICD-10-CM

## 2014-04-28 DIAGNOSIS — O98819 Other maternal infectious and parasitic diseases complicating pregnancy, unspecified trimester: Secondary | ICD-10-CM

## 2014-04-28 DIAGNOSIS — A749 Chlamydial infection, unspecified: Secondary | ICD-10-CM

## 2014-04-28 LAB — POCT URINALYSIS DIPSTICK
Blood, UA: NEGATIVE
Glucose, UA: NEGATIVE
Ketones, UA: NEGATIVE
LEUKOCYTES UA: NEGATIVE
Nitrite, UA: NEGATIVE
PROTEIN UA: NEGATIVE

## 2014-04-28 NOTE — Patient Instructions (Addendum)
Call the office [phone]) or go to [facility] if:  You begin to have strong, frequent contractions  Your water breaks.  Sometimes it is a big gush of fluid, sometimes it is just a trickle that keeps getting your panties wet or running down your legs  You have vaginal bleeding.  It is normal to have a small amount of spotting if your cervix was checked.   You don't feel your baby moving like normal.  If you don't, get you something to eat and drink and lay down and focus on feeling your baby move.  You should feel at least 10 movements in 2 hours.  If you don't, you should call the office or go to [facility].   Chlamydia Chlamydia is an infection. It is spread through sexual contact. Chlamydia can be in different areas of the body. These areas include the cervix, urethra, throat, or rectum. You may not know you have chlamydia because many people never develop the symptoms. Chlamydia is not difficult to treat once you know you have it. However, if it is left untreated, chlamydia can lead to more serious health problems.  CAUSES  Chlamydia is caused by bacteria. It is a sexually transmitted disease. It is passed from an infected partner during intimate contact. This contact could be with the genitals, mouth, or rectal area. Chlamydia can also be passed from mothers to babies during birth. SIGNS AND SYMPTOMS  There may not be any symptoms. This is often the case early in the infection. If symptoms develop, they may include:  Mild pain and discomfort when urinating.  Redness, soreness, and swelling (inflammation) of the rectum.  Vaginal discharge.  Painful intercourse.  Abdominal pain.  Bleeding between menstrual periods. DIAGNOSIS  To diagnose this infection, your health care provider will do a pelvic exam. Cultures will be taken of the vagina, cervix, urine, and possibly the rectum to verify the diagnosis.  TREATMENT You will be given antibiotic medicines. If you are pregnant,  certain types of antibiotics will need to be avoided. Any sexual partners should also be treated, even if they do not show symptoms.  HOME CARE INSTRUCTIONS   Take your antibiotic medicine as directed by your health care provider. Finish the antibiotic even if you start to feel better.  Take medicines only as directed by your health care provider.  Inform any sexual partners about the infection. They should also be treated.  Do not have sexual contact until your health care provider tells you it is okay.  Get plenty of rest.  Eat a well-balanced diet.  Drink enough fluids to keep your urine clear or pale yellow.  Keep all follow-up visits as directed by your health care provider. SEEK MEDICAL CARE IF:  You have painful urination.  You have abdominal pain.  You have vaginal discharge.  You have painful sexual intercourse.  You have bleeding between periods and after sex.  You have a fever. SEEK IMMEDIATE MEDICAL CARE IF:   You experience nausea or vomiting.  You experience excessive sweating (diaphoresis).  You have difficulty swallowing. MAKE SURE YOU:   Understand these instructions.  Will watch your condition.  Will get help right away if you are not doing well or get worse. Document Released: 05/17/2005 Document Revised: 12/22/2013 Document Reviewed: 04/14/2013 [facility] Patient Information 2015 [place], [place]. This information is not intended to replace advice given to you by your health care provider. Make sure you discuss any questions you have with your health care provider.  Braxton Hicks Contractions Contractions of the uterus can occur throughout pregnancy. Contractions are not always a sign that you are in labor.  WHAT ARE BRAXTON HICKS CONTRACTIONS?  Contractions that occur before labor are called Braxton Hicks contractions, or false labor. Toward the end of pregnancy (32-34 weeks), these contractions can develop more often and may become more forceful.  This is not true labor because these contractions do not result in opening (dilatation) and thinning of the cervix. They are sometimes difficult to tell apart from true labor because these contractions can be forceful and people have different pain tolerances. You should not feel embarrassed if you go to the hospital with false labor. Sometimes, the only way to tell if you are in true labor is for your health care provider to look for changes in the cervix. If there are no prenatal problems or other health problems associated with the pregnancy, it is completely safe to be sent home with false labor and await the onset of true labor. HOW CAN YOU TELL THE DIFFERENCE BETWEEN TRUE AND FALSE LABOR? False Labor  The contractions of false labor are usually shorter and not as hard as those of true labor.   The contractions are usually irregular.   The contractions are often felt in the front of the lower abdomen and in the groin.   The contractions may go away when you walk around or change positions while lying down.   The contractions get weaker and are shorter lasting as time goes on.   The contractions do not usually become progressively stronger, regular, and closer together as with true labor.  True Labor  Contractions in true labor last 30-70 seconds, become very regular, usually become more intense, and increase in frequency.   The contractions do not go away with walking.   The discomfort is usually felt in the top of the uterus and spreads to the lower abdomen and low back.   True labor can be determined by your health care provider with an exam. This will show that the cervix is dilating and getting thinner.  WHAT TO REMEMBER  Keep up with your usual exercises and follow other instructions given by your health care provider.   Take medicines as directed by your health care provider.   Keep your regular prenatal appointments.   Eat and drink lightly if you think you  are going into labor.   If Braxton Hicks contractions are making you uncomfortable:   Change your position from lying down or resting to walking, or from walking to resting.   Sit and rest in a tub of warm water.   Drink 2-3 glasses of water. Dehydration may cause these contractions.   Do slow and deep breathing several times an hour.  WHEN SHOULD I SEEK IMMEDIATE MEDICAL CARE? Seek immediate medical care if:  Your contractions become stronger, more regular, and closer together.   You have fluid leaking or gushing from your vagina.   You have a fever.   You pass blood-tinged mucus.   You have vaginal bleeding.   You have continuous abdominal pain.   You have low back pain that you never had before.   You feel your baby's head pushing down and causing pelvic pressure.   Your baby is not moving as much as it used to.  Document Released: 08/07/2005 Document Revised: 08/12/2013 Document Reviewed: 05/19/2013 [facility] Patient Information 2015 [place], [place]. This information is not intended to replace advice given to you by your health  care provider. Make sure you discuss any questions you have with your health care provider.

## 2014-04-28 NOTE — Progress Notes (Signed)
Low-risk OB appointment G1P0 [redacted]w[redacted]d Estimated Date of Delivery: 05/04/14 BP 108/54  Wt 173 lb (78.472 kg)  LMP 07/22/2013  BP, weight, and urine reviewed.  Refer to obstetrical flow sheet for FH & FHR.  Reports good fm.  Denies regular uc's, lof, vb, or uti s/s. No complaints. Notified pt of +CT- states she took azithromycin that was rx'd- her pharmacy called her and told her she had an rx, so she took it.  No THC in >30d, will retest today SVE per request: ft/70/-2, soft, vtx Reviewed labor s/s, fkc. Plan:  Continue routine obstetrical care  F/U in 1wk for OB appointment

## 2014-04-29 ENCOUNTER — Encounter: Payer: Self-pay | Admitting: Women's Health

## 2014-04-29 LAB — DRUG SCREEN, URINE, NO CONFIRMATION
AMPHETAMINE SCRN UR: NEGATIVE
BARBITURATE QUANT UR: NEGATIVE
Benzodiazepines.: NEGATIVE
Cocaine Metabolites: NEGATIVE
Creatinine,U: 80.1 mg/dL
MARIJUANA METABOLITE: POSITIVE — AB
METHADONE: NEGATIVE
Opiate Screen, Urine: NEGATIVE
PROPOXYPHENE: NEGATIVE
Phencyclidine (PCP): NEGATIVE

## 2014-05-05 ENCOUNTER — Inpatient Hospital Stay (HOSPITAL_COMMUNITY): Payer: Medicaid Other

## 2014-05-05 ENCOUNTER — Ambulatory Visit (INDEPENDENT_AMBULATORY_CARE_PROVIDER_SITE_OTHER): Payer: Medicaid Other | Admitting: Advanced Practice Midwife

## 2014-05-05 VITALS — BP 120/68 | Wt 176.0 lb

## 2014-05-05 DIAGNOSIS — Z3401 Encounter for supervision of normal first pregnancy, first trimester: Secondary | ICD-10-CM

## 2014-05-05 DIAGNOSIS — Z1389 Encounter for screening for other disorder: Secondary | ICD-10-CM

## 2014-05-05 DIAGNOSIS — Z3400 Encounter for supervision of normal first pregnancy, unspecified trimester: Secondary | ICD-10-CM

## 2014-05-05 DIAGNOSIS — Z331 Pregnant state, incidental: Secondary | ICD-10-CM

## 2014-05-05 LAB — POCT URINALYSIS DIPSTICK
Blood, UA: NEGATIVE
Glucose, UA: NEGATIVE
Ketones, UA: NEGATIVE
Leukocytes, UA: NEGATIVE
Nitrite, UA: NEGATIVE
PROTEIN UA: NEGATIVE

## 2014-05-05 NOTE — Patient Instructions (Signed)
If you are still pregnant on Tuesday, 9/22 at 7:30pm, come to [facility] ([address] in [place], [place]) to start your induction!  Eat a light meal before you come.  Denise Sandoval!!

## 2014-05-05 NOTE — Progress Notes (Signed)
G1P0 [redacted]w[redacted]d Estimated Date of Delivery: 05/04/14  Blood pressure 120/68, weight 176 lb (79.833 kg), last menstrual period 07/22/2013.   BP weight and urine results all reviewed and noted.  Please refer to the obstetrical flow sheet for the fundal height and fetal heart rate documentation:  Patient reports good fetal movement, denies any bleeding and no rupture of membranes symptoms or regular contractions. Patient is without complaints. All questions were answered.  Plan: IOL scheduled for 05/12/14 @ 1930 if undelivered Follow up in 6 weeks for postpartum appointment

## 2014-05-06 ENCOUNTER — Inpatient Hospital Stay (HOSPITAL_COMMUNITY)
Admission: AD | Admit: 2014-05-06 | Discharge: 2014-05-08 | DRG: 775 | Disposition: A | Discharge status: Home / Self Care | Payer: Medicaid Other | Source: Ambulatory Visit | Attending: Obstetrics and Gynecology | Admitting: Obstetrics and Gynecology

## 2014-05-06 ENCOUNTER — Inpatient Hospital Stay (HOSPITAL_COMMUNITY): Payer: Medicaid Other | Admitting: Anesthesiology

## 2014-05-06 ENCOUNTER — Encounter (HOSPITAL_COMMUNITY): Payer: Self-pay | Admitting: *Deleted

## 2014-05-06 ENCOUNTER — Encounter (HOSPITAL_COMMUNITY): Payer: Medicaid Other | Admitting: Anesthesiology

## 2014-05-06 DIAGNOSIS — Z87891 Personal history of nicotine dependence: Secondary | ICD-10-CM

## 2014-05-06 DIAGNOSIS — Z8249 Family history of ischemic heart disease and other diseases of the circulatory system: Secondary | ICD-10-CM

## 2014-05-06 DIAGNOSIS — A749 Chlamydial infection, unspecified: Secondary | ICD-10-CM

## 2014-05-06 DIAGNOSIS — Z3401 Encounter for supervision of normal first pregnancy, first trimester: Secondary | ICD-10-CM

## 2014-05-06 DIAGNOSIS — O479 False labor, unspecified: Secondary | ICD-10-CM | POA: Diagnosis present

## 2014-05-06 DIAGNOSIS — O9902 Anemia complicating childbirth: Secondary | ICD-10-CM | POA: Diagnosis present

## 2014-05-06 DIAGNOSIS — D649 Anemia, unspecified: Secondary | ICD-10-CM | POA: Diagnosis present

## 2014-05-06 DIAGNOSIS — O98819 Other maternal infectious and parasitic diseases complicating pregnancy, unspecified trimester: Secondary | ICD-10-CM

## 2014-05-06 DIAGNOSIS — IMO0001 Reserved for inherently not codable concepts without codable children: Secondary | ICD-10-CM

## 2014-05-06 LAB — CBC
HEMATOCRIT: 33.4 % — AB (ref 36.0–46.0)
HEMOGLOBIN: 11.6 g/dL — AB (ref 12.0–15.0)
MCH: 31.5 pg (ref 26.0–34.0)
MCHC: 34.7 g/dL (ref 30.0–36.0)
MCV: 90.8 fL (ref 78.0–100.0)
Platelets: 261 10*3/uL (ref 150–400)
RBC: 3.68 MIL/uL — ABNORMAL LOW (ref 3.87–5.11)
RDW: 13.3 % (ref 11.5–15.5)
WBC: 21.8 10*3/uL — ABNORMAL HIGH (ref 4.0–10.5)

## 2014-05-06 LAB — TYPE AND SCREEN
ABO/RH(D): O NEG
Antibody Screen: NEGATIVE

## 2014-05-06 MED ORDER — PENICILLIN G POTASSIUM 5000000 UNITS IJ SOLR
5.0000 10*6.[IU] | Freq: Once | INTRAVENOUS | Status: AC
  Administered 2014-05-06: 5 10*6.[IU] via INTRAVENOUS
  Filled 2014-05-06: qty 5

## 2014-05-06 MED ORDER — OXYCODONE-ACETAMINOPHEN 5-325 MG PO TABS
1.0000 | ORAL_TABLET | ORAL | Status: DC | PRN
  Administered 2014-05-07: 1 via ORAL
  Filled 2014-05-06: qty 1

## 2014-05-06 MED ORDER — FENTANYL CITRATE 0.05 MG/ML IJ SOLN
100.0000 ug | Freq: Once | INTRAMUSCULAR | Status: AC
  Administered 2014-05-06: 100 ug via INTRAVENOUS

## 2014-05-06 MED ORDER — OXYCODONE-ACETAMINOPHEN 5-325 MG PO TABS
2.0000 | ORAL_TABLET | ORAL | Status: DC | PRN
Start: 2014-05-06 — End: 2014-05-07

## 2014-05-06 MED ORDER — LACTATED RINGERS IV SOLN: INTRAVENOUS | Status: DC

## 2014-05-06 MED ORDER — OXYTOCIN BOLUS FROM INFUSION
500.0000 mL | INTRAVENOUS | Status: DC
  Administered 2014-05-07: 500 mL via INTRAVENOUS

## 2014-05-06 MED ORDER — PHENYLEPHRINE 40 MCG/ML (10ML) SYRINGE FOR IV PUSH (FOR BLOOD PRESSURE SUPPORT)
80.0000 ug | PREFILLED_SYRINGE | INTRAVENOUS | Status: DC | PRN
  Filled 2014-05-06: qty 10
  Filled 2014-05-06: qty 2

## 2014-05-06 MED ORDER — FENTANYL CITRATE 0.05 MG/ML IJ SOLN
100.0000 ug | INTRAMUSCULAR | Status: DC | PRN
  Filled 2014-05-06: qty 2

## 2014-05-06 MED ORDER — OXYTOCIN 40 UNITS IN LACTATED RINGERS INFUSION - SIMPLE MED
62.5000 mL/h | INTRAVENOUS | Status: DC
  Filled 2014-05-06: qty 1000

## 2014-05-06 MED ORDER — ONDANSETRON HCL 4 MG/2ML IJ SOLN: 4.0000 mg | Freq: Four times a day (QID) | INTRAMUSCULAR | Status: DC | PRN

## 2014-05-06 MED ORDER — ACETAMINOPHEN 325 MG PO TABS: 650.0000 mg | ORAL_TABLET | ORAL | Status: DC | PRN

## 2014-05-06 MED ORDER — CITRIC ACID-SODIUM CITRATE 334-500 MG/5ML PO SOLN: 30.0000 mL | ORAL | Status: DC | PRN

## 2014-05-06 MED ORDER — DIPHENHYDRAMINE HCL 50 MG/ML IJ SOLN: 12.5000 mg | INTRAMUSCULAR | Status: DC | PRN

## 2014-05-06 MED ORDER — LIDOCAINE HCL (PF) 1 % IJ SOLN
30.0000 mL | INTRAMUSCULAR | Status: AC | PRN
  Administered 2014-05-06 (×2): 5 mL via SUBCUTANEOUS
  Filled 2014-05-06: qty 30

## 2014-05-06 MED ORDER — LACTATED RINGERS IV SOLN: 500.0000 mL | INTRAVENOUS | Status: DC | PRN

## 2014-05-06 MED ORDER — EPHEDRINE 5 MG/ML INJ
10.0000 mg | INTRAVENOUS | Status: DC | PRN
  Filled 2014-05-06: qty 2

## 2014-05-06 MED ORDER — PHENYLEPHRINE 40 MCG/ML (10ML) SYRINGE FOR IV PUSH (FOR BLOOD PRESSURE SUPPORT)
80.0000 ug | PREFILLED_SYRINGE | INTRAVENOUS | Status: DC | PRN
Start: 2014-05-06 — End: 2014-05-07
  Filled 2014-05-06: qty 2

## 2014-05-06 MED ORDER — LACTATED RINGERS IV SOLN: 500.0000 mL | Freq: Once | INTRAVENOUS | Status: DC

## 2014-05-06 MED ORDER — FENTANYL 2.5 MCG/ML BUPIVACAINE 1/10 % EPIDURAL INFUSION (WH - ANES)
14.0000 mL/h | INTRAMUSCULAR | Status: DC | PRN
  Administered 2014-05-06: 14 mL/h via EPIDURAL
  Filled 2014-05-06: qty 125

## 2014-05-06 MED ORDER — PENICILLIN G POTASSIUM 5000000 UNITS IJ SOLR
2.5000 10*6.[IU] | INTRAVENOUS | Status: DC
  Filled 2014-05-06 (×5): qty 2.5

## 2014-05-06 NOTE — MAU Provider Note (Signed)
First Provider Initiated Contact with Patient 05/06/14 1747      Chief Complaint:  Labor Eval   Denise Sandoval is  18 y.o. G1P0 at [redacted]w[redacted]d presents complaining of Labor Eval She started having contractions 1.5 hours ago. Note that they have gotten closer together and are now more powerful. Contractions q 2-3 minutes. Had her membranes stripped yesterday in clinic. +bloody show. No LOF. Good fetal movement.  Obstetrical/Gynecological History: OB History   Grav Para Term Preterm Abortions TAB SAB Ect Mult Living   1              Past Medical History: Past Medical History  Diagnosis Date  . Supervision of normal first pregnancy in first trimester 09/23/2013  . Kidney infection     Past Surgical History: Past Surgical History  Procedure Laterality Date  . No past surgeries      Family History: Family History  Problem Relation Age of Onset  . Alcohol abuse Mother   . Alcohol abuse Father   . Hypertension Maternal Grandmother   . Alcohol abuse Maternal Grandfather     Social History: History  Substance Use Topics  . Smoking status: Former Games developer  . Smokeless tobacco: Never Used  . Alcohol Use: No    Allergies: No Known Allergies  Meds:  Prescriptions prior to admission  Medication Sig Dispense Refill  . azithromycin (ZITHROMAX) 250 MG tablet Take 1,000 mg by mouth once.      . Iron-FA-B Cmp-C-Biot-Probiotic (FUSION PLUS) CAPS Take 1 capsule by mouth See admin instructions. 1 time daily between meals  30 capsule  6  . loratadine (CLARITIN) 10 MG tablet Take 10 mg by mouth daily as needed for allergies.      . nitrofurantoin, macrocrystal-monohydrate, (MACROBID) 100 MG capsule Take 1 pill PO BID for 7 days and then 1 pill daily for the remainder of the pregnancy  14 capsule  3  . Prenatal Vit-Fe Fumarate-FA (PRENATAL MULTIVITAMIN) TABS tablet Take 1 tablet by mouth daily at 12 noon.        Review of Systems -   Review of Systems  Constitutional: Negative for fever,  chills, weight loss, malaise/fatigue and diaphoresis.  HENT: Negative for hearing loss, ear pain, nosebleeds, congestion, sore throat, neck pain, tinnitus and ear discharge.   Eyes: Negative for blurred vision, double vision, photophobia, pain, discharge and redness.  Respiratory: Negative for cough, hemoptysis, sputum production, shortness of breath, wheezing and stridor.   Cardiovascular: Negative for chest pain, palpitations, orthopnea,  leg swelling  Gastrointestinal: Negative for abdominal pain heartburn, nausea, vomiting, diarrhea, constipation, blood in stool Genitourinary: Negative for dysuria, urgency, frequency, hematuria and flank pain.  Musculoskeletal: Negative for myalgias, back pain, joint pain and falls.  Skin: Negative for itching and rash.  Neurological: Negative for dizziness, tingling, tremors, sensory change, speech change, focal weakness, seizures, loss of consciousness, weakness and headaches.  Endo/Heme/Allergies: Negative for environmental allergies and polydipsia. Does not bruise/bleed easily.  Psychiatric/Behavioral: Negative for depression, suicidal ideas, hallucinations, memory loss and substance abuse. The patient is not nervous/anxious and does not have insomnia.      Physical Exam  Blood pressure 126/77, pulse 79, temperature 98.5 F (36.9 C), temperature source Oral, resp. rate 16, last menstrual period 07/22/2013. GENERAL: Well-developed, well-nourished female in no acute distress.  LUNGS: Clear to auscultation bilaterally.  HEART: Regular rate and rhythm. ABDOMEN: Soft, nontender, nondistended, gravid.  EXTREMITIES: Nontender, no edema, 2+ distal pulses. DTR's 2+ CERVICAL EXAM: Dilatation 2cm   Effacement 80%  Station -1   Presentation: unsure FHT:  Baseline rate 140 bpm   Variability moderate  Accelerations present   Decelerations none Contractions: Every 3-4 mins   Labs: No results found for this or any previous visit (from the past 24  hour(s)). Imaging Studies:  US Ob Follow Up  04/21/2014   FOLLOW UP SONOGRAM   Denise Sandoval is in the office for a follow up sonogram for Size<Dates.  She is a 18 y.o. year old G1P0 with Estimated Date of Delivery: 05/04/14  now at  [redacted]w[redacted]d weeks gestation. Thus far the pregnancy has been complicated  by +UDS.  GESTATION: SINGLETON  PRESENTATION: cephalic  FETAL ACTIVITY:          Heart rate         142 bpm          The fetus is active.  AMNIOTIC FLUID: The amniotic fluid volume is  normal, 8.2 cm.  PLACENTA LOCALIZATION:  posterior GRADE 2  CERVIX: N/A  ADNEXA: The adnexa appears normal.   GESTATIONAL AGE AND  BIOMETRICS:  Gestational criteria: Estimated Date of Delivery: 05/04/14  now at [redacted]w[redacted]d  Previous Scans:2  GESTATIONAL SAC            mm          weeks  CROWN RUMP LENGTH            mm          weeks  NUCHAL TRANSLUCENCY            mm           BIPARIETAL DIAMETER           9.48 cm         38+5 weeks  HEAD CIRCUMFERENCE           33.85 cm         38+6 weeks  ABDOMINAL CIRCUMFERENCE           34.7 cm         38+4 weeks  FEMUR LENGTH           7.05 cm         36+1 weeks                                                           AVERAGE EGA(BY THIS SCAN):   38+0 weeks                                                 ESTIMATED FETAL WEIGHT:        3344  grams, 59 % ANATOMICAL SURVEY                                                                             COMMENTS CEREBRAL VENTRICLES yes normal   CHOROID PLEXUS yes normal   CEREBELLUM yes normal   CISTERNA  MAGNA yes normal   NUCHAL REGION yes normal   ORBITS yes normal   NASAL BONE yes normal   NOSE/LIP yes normal   FACIAL PROFILE yes normal   4 CHAMBERED HEART no    OUTFLOW TRACTS no    DIAPHRAGM yes normal   STOMACH yes normal   RENAL REGION no    BLADDER no    CORD INSERTION no    3 VESSEL CORD yes normal   SPINE no    ARMS/HANDS no    LEGS/FEET no    GENITALIA   Female "Denise Sandoval"         SUSPECTED ABNORMALITIES:  no  QUALITY OF SCAN: satisfactory  TECHNICIAN  COMMENTS:  U/S(38+0wks)-vtx active fetus, EFw 7 lb 6 oz (59th%tile), fluid WNL  AFI-8.2cm, posterior Gr 2 placenta, FHR- 142 bpm, female fetus "Denise Sandoval"    A copy of this report including all images has been saved and backed up to  a second source for retrieval if needed. All measures and details of the  anatomical scan, placentation, fluid volume and pelvic anatomy are  contained in that report.  Chari Manning 04/20/2014 11:49 AM  Clinical Impression and recommendations:  I have reviewed the sonogram results above, combined with the patient's  current clinical course, below are my impressions and any appropriate  recommendations for management based on the sonographic findings.   1.  G1P0 Estimated Date of Delivery: 05/04/14 by  early ultrasound and  confirmed by today's sonographic dating 2.  Normal fetal sonographic findings, Specifically normal detailed  anatomical evaluation     With growth appropriate at 59%ile 3.  Normal general sonographic findings  Recommend routine prenatal care based on this sonogram or as clinically  indicated  FERGUSON,JOHN V 04/21/2014 10:51 AM        MAU Course  Patient noted to be 2/80/-1 by RN at 1719. Discussed walking with repeat cervical exam in 2 hours.   Patient checked after walking x 3 hours and was noted to be 4/90/-2 (DP)  Assessment: Denise Sandoval is  18 y.o. G1P0 at [redacted]w[redacted]d presents with SOL.  Plan: - Admit to birthing suits  - May have epidural - PCN for GBS  Joanna Puff 9/16/20155:48 PM  Evaluation and management procedures were performed by Resident physician under my supervision/collaboration. Chart reviewed, patient examined by me and I agree with management and plan. Danae Orleans, CNM 05/06/2014 8:23 PM

## 2014-05-06 NOTE — Progress Notes (Signed)
Pt unsure

## 2014-05-06 NOTE — Anesthesia Preprocedure Evaluation (Signed)
Anesthesia Evaluation  Patient identified by MRN, date of birth, ID band Patient awake    Reviewed: Allergy & Precautions, H&P , NPO status , Patient's Chart, lab work & pertinent test results  History of Anesthesia Complications Negative for: history of anesthetic complications  Airway Mallampati: II TM Distance: >3 FB Neck ROM: Full    Dental  (+) Teeth Intact   Pulmonary neg sleep apnea, neg COPDneg recent URI, former smoker,          Cardiovascular negative cardio ROS  Rhythm:Regular     Neuro/Psych negative neurological ROS  negative psych ROS   GI/Hepatic   Endo/Other    Renal/GU Recurrent pyelonephritis during pregnancy     Musculoskeletal   Abdominal   Peds  Hematology  (+) anemia ,   Anesthesia Other Findings   Reproductive/Obstetrics (+) Pregnancy                           Anesthesia Physical Anesthesia Plan  ASA: II  Anesthesia Plan: Epidural   Post-op Pain Management:    Induction:   Airway Management Planned:   Additional Equipment:   Intra-op Plan:   Post-operative Plan:   Informed Consent: I have reviewed the patients History and Physical, chart, labs and discussed the procedure including the risks, benefits and alternatives for the proposed anesthesia with the patient or authorized representative who has indicated his/her understanding and acceptance.     Plan Discussed with: Anesthesiologist  Anesthesia Plan Comments:         Anesthesia Quick Evaluation

## 2014-05-06 NOTE — H&P (Signed)
Denise Sandoval is 18 y.o. G1P0 at [redacted]w[redacted]d presents complaining of Labor Eval.   She started having contractions 1.5 hours ago. Note that they have gotten closer together and are now more powerful. Contractions q 2-3 minutes. Had her membranes stripped yesterday in clinic. +bloody show. No LOF. Good fetal movement. She desires epidural for pain. She intends to bottle feed.    [facility]  Prenatal History/Complications:  Past Medical History: Past Medical History  Diagnosis Date  . Supervision of normal first pregnancy in first trimester 09/23/2013  . Kidney infection     Past Surgical History: Past Surgical History  Procedure Laterality Date  . No past surgeries      Obstetrical History: OB History   Grav Para Term Preterm Abortions TAB SAB Ect Mult Living   1               Social History: History   Social History  . Marital Status: Single    Spouse Name: N/A    Number of Children: N/A  . Years of Education: N/A   Social History Main Topics  . Smoking status: Former Games developer  . Smokeless tobacco: Never Used  . Alcohol Use: No  . Drug Use: No     Comment: none since + preg test  . Sexual Activity: Not Currently    Birth Control/ Protection: None   Other Topics Concern  . None   Social History Narrative  . None    Family History: Family History  Problem Relation Age of Onset  . Alcohol abuse Mother   . Alcohol abuse Father   . Hypertension Maternal Grandmother   . Alcohol abuse Maternal Grandfather     Allergies: No Known Allergies  Prescriptions prior to admission  Medication Sig Dispense Refill  . azithromycin (ZITHROMAX) 250 MG tablet Take 1,000 mg by mouth once.      . Iron-FA-B Cmp-C-Biot-Probiotic (FUSION PLUS) CAPS Take 1 capsule by mouth See admin instructions. 1 time daily between meals  30 capsule  6  . loratadine (CLARITIN) 10 MG tablet Take 10 mg by mouth daily as needed for allergies.      . nitrofurantoin,  macrocrystal-monohydrate, (MACROBID) 100 MG capsule Take 1 pill PO BID for 7 days and then 1 pill daily for the remainder of the pregnancy  14 capsule  3  . Prenatal Vit-Fe Fumarate-FA (PRENATAL MULTIVITAMIN) TABS tablet Take 1 tablet by mouth daily at 12 noon.         Review of Systems   Constitutional: No fever, chills, or fatigue  Blood pressure 126/77, pulse 79, temperature 98.5 F (36.9 C), temperature source Oral, resp. rate 16, last menstrual period 07/22/2013. General appearance: alert, cooperative and moderate distress Lungs: clear to auscultation bilaterally Heart: regular rate and rhythm Abdomen: soft, non-tender; bowel sounds normal Pelvic: adequate Extremities: Homans sign is negative, no sign of DVT DTR's 2+ Presentation: unsure FHT: Baseline rate 140 bpm Variability moderate Accelerations present Decelerations none  Contractions: Every 3-4 mins  SVE: 4/90/-2 FHTs 135 baseline HR,  moderate variability + accelerations, no decelerations.   Prenatal labs: ABO, Rh: O/NEG/-- (02/03 1536) Antibody: NEG (06/24 0000) Rubella:   RPR: NON REAC (06/24 0000)  HBsAg: NEGATIVE (02/03 1536)  HIV: NONREACTIVE (06/24 0000)  GBS: Detected (08/27 1007)  1 hr Glucola : 2hr GTT normal Genetic screening  AFP negative Anatomy US normal, girl   [facility]  FOB dustin coffman 20 yo wm, 1st baby, dating  Pap <  21yo  GC/CT Initial:    Negative 09/23/13            36+wks: -/+  Genetic Screen AFP negative  CF screen negative  Anatomic [place] Normal [place], female, 'Lilah'  Flu vaccine Declined 09/23/13 but will think about it  Tdap Recommended ~ 28wks  Glucose Screen  2 hr  70/99/66  GBS Pos  Feed Preference breast  Contraception Nexplanon, ordered 8/19  Circumcision girl  Childbirth Classes Recommended, info given- if doesn't make it- recommended tour- didn't make it  Pediatrician [place]    Prenatal Transfer Tool  Maternal Diabetes: No Genetic Screening: Normal Maternal  Ultrasounds/Referrals: Normal Fetal Ultrasounds or other Referrals:  None Maternal Substance Abuse:  Yes:  Type: Marijuana Significant Maternal Medications:  None Significant Maternal Lab Results: Lab values include: Group B Strep positive, Other: +chlamydia in third trimester 04/16/14 s/p azithromycin, TOC pending  Assessment: Denise Sandoval is a 18 y.o. G1P0 at [redacted]w[redacted]d who was admitted in early labor. #Labor: Early. Admit for expectant management. #Pain: Pt desires epidural. #FWB: Category I strip. #ID:  Start PCN. Will repeat GC chlamydia urine PCR for TOC. #MOF: bottle #MOC: nexplanon #Circ:  n/a  Christianne Borrow, MD, PGY3 05/06/2014, 8:12 PM  I have seen and examined this patient and I agree with the above. Cam Hai CNM 12:48 AM 05/07/2014     Attestation of Attending Supervision of Advanced Practitioner: Evaluation and management procedures were performed by the PA/NP/CNM/OB Fellow under my supervision/collaboration. Chart reviewed and agree with management and plan.  Reshard Guillet V 05/10/2014 9:07 PM

## 2014-05-06 NOTE — Progress Notes (Signed)
Denise Sandoval is a 18 y.o. G1P0 at [redacted]w[redacted]d admitted for active labor  Subjective:   Objective: BP 126/77  Pulse 79  Temp(Src) 98.5 F (36.9 C) (Oral)  Resp 20  LMP 07/22/2013      FHT:  FHR: 130 bpm, variability: moderate,  accelerations:  Present,  decelerations:  Present early UC:   regular, every SVE:   10/100/0, SROM , clear  Labs: Lab Results  Component Value Date   WBC 21.8* 05/06/2014   HGB 11.6* 05/06/2014   HCT 33.4* 05/06/2014   MCV 90.8 05/06/2014   PLT 261 05/06/2014    Assessment / Plan: Spontaneous labor, progressing normally  Labor: Progressing normally Preeclampsia:  no signs or symptoms of toxicity Fetal Wellbeing:  Category I Pain Control:  Epidural I/D:  PCN, awaiting GC chlamydia TOC Anticipated MOD:  NSVD  Christianne Borrow A,MD,PGY3 05/06/2014, 11:24 PM

## 2014-05-06 NOTE — MAU Note (Signed)
Was checked in clinic yesterday, was 1cm, they stripped her membranes.  No leaking, passed mucous plug.  Contracting every .

## 2014-05-06 NOTE — MAU Note (Signed)
Pt states she started having contractions about 11/2 hours ago.

## 2014-05-06 NOTE — Anesthesia Procedure Notes (Signed)
Epidural Patient location during procedure: OB Start time: 05/06/2014 9:02 PM End time: 05/06/2014 9:16 PM  Staffing Anesthesiologist: Yi Falletta, CHRIS Performed by: anesthesiologist   Preanesthetic Checklist Completed: patient identified, surgical consent, pre-op evaluation, timeout performed, IV checked, risks and benefits discussed and monitors and equipment checked  Epidural Patient position: sitting Prep: site prepped and draped and DuraPrep Patient monitoring: heart rate, cardiac monitor, continuous pulse ox and blood pressure Approach: midline Location: L3-L4 Injection technique: LOR saline  Needle:  Needle type: Tuohy  Needle gauge: 17 G Needle length: 9 cm Needle insertion depth: 5 cm Catheter type: closed end flexible Catheter size: 19 Gauge Catheter at skin depth: 11 cm Test dose: Other  Assessment Events: blood not aspirated, injection not painful, no injection resistance, negative IV test and no paresthesia  Additional Notes H+P and labs checked, risks and benefits discussed with the patient, consent obtained, procedure tolerated well and without complications.  Reason for block:procedure for pain

## 2014-05-07 ENCOUNTER — Encounter (HOSPITAL_COMMUNITY): Payer: Self-pay | Admitting: *Deleted

## 2014-05-07 DIAGNOSIS — D649 Anemia, unspecified: Secondary | ICD-10-CM

## 2014-05-07 DIAGNOSIS — O9902 Anemia complicating childbirth: Secondary | ICD-10-CM

## 2014-05-07 LAB — CBC
HEMATOCRIT: 30.1 % — AB (ref 36.0–46.0)
HEMOGLOBIN: 10.3 g/dL — AB (ref 12.0–15.0)
MCH: 31.2 pg (ref 26.0–34.0)
MCHC: 34.2 g/dL (ref 30.0–36.0)
MCV: 91.2 fL (ref 78.0–100.0)
Platelets: 261 10*3/uL (ref 150–400)
RBC: 3.3 MIL/uL — AB (ref 3.87–5.11)
RDW: 13.1 % (ref 11.5–15.5)
WBC: 28 10*3/uL — AB (ref 4.0–10.5)

## 2014-05-07 LAB — ABO/RH: ABO/RH(D): O NEG

## 2014-05-07 LAB — RPR

## 2014-05-07 LAB — HIV ANTIBODY (ROUTINE TESTING W REFLEX): HIV: NONREACTIVE

## 2014-05-07 MED ORDER — IBUPROFEN 600 MG PO TABS
600.0000 mg | ORAL_TABLET | Freq: Four times a day (QID) | ORAL | Status: DC
  Administered 2014-05-07 – 2014-05-08 (×7): 600 mg via ORAL
  Filled 2014-05-07 (×7): qty 1

## 2014-05-07 MED ORDER — PRENATAL MULTIVITAMIN CH
1.0000 | ORAL_TABLET | Freq: Every day | ORAL | Status: DC
  Administered 2014-05-07 – 2014-05-08 (×2): 1 via ORAL
  Filled 2014-05-07 (×2): qty 1

## 2014-05-07 MED ORDER — LANOLIN HYDROUS EX OINT: TOPICAL_OINTMENT | CUTANEOUS | Status: DC | PRN

## 2014-05-07 MED ORDER — OXYCODONE-ACETAMINOPHEN 5-325 MG PO TABS
1.0000 | ORAL_TABLET | ORAL | Status: DC | PRN
  Administered 2014-05-07: 1 via ORAL
  Filled 2014-05-07 (×3): qty 1

## 2014-05-07 MED ORDER — ONDANSETRON HCL 4 MG/2ML IJ SOLN: 4.0000 mg | INTRAMUSCULAR | Status: DC | PRN

## 2014-05-07 MED ORDER — WITCH HAZEL-GLYCERIN EX PADS: 1.0000 "application " | MEDICATED_PAD | CUTANEOUS | Status: DC | PRN

## 2014-05-07 MED ORDER — TETANUS-DIPHTH-ACELL PERTUSSIS 5-2.5-18.5 LF-MCG/0.5 IM SUSP
0.5000 mL | Freq: Once | INTRAMUSCULAR | Status: AC
  Administered 2014-05-08: 0.5 mL via INTRAMUSCULAR

## 2014-05-07 MED ORDER — ZOLPIDEM TARTRATE 5 MG PO TABS: 5.0000 mg | ORAL_TABLET | Freq: Every evening | ORAL | Status: DC | PRN

## 2014-05-07 MED ORDER — ONDANSETRON HCL 4 MG PO TABS: 4.0000 mg | ORAL_TABLET | ORAL | Status: DC | PRN

## 2014-05-07 MED ORDER — OXYCODONE-ACETAMINOPHEN 5-325 MG PO TABS
2.0000 | ORAL_TABLET | ORAL | Status: DC | PRN
  Administered 2014-05-07 – 2014-05-08 (×3): 2 via ORAL
  Filled 2014-05-07 (×2): qty 2

## 2014-05-07 MED ORDER — MEASLES, MUMPS & RUBELLA VAC ~~LOC~~ INJ
0.5000 mL | INJECTION | Freq: Once | SUBCUTANEOUS | Status: DC
  Filled 2014-05-07: qty 0.5

## 2014-05-07 MED ORDER — SIMETHICONE 80 MG PO CHEW: 80.0000 mg | CHEWABLE_TABLET | ORAL | Status: DC | PRN

## 2014-05-07 MED ORDER — SENNOSIDES-DOCUSATE SODIUM 8.6-50 MG PO TABS
2.0000 | ORAL_TABLET | ORAL | Status: DC
  Administered 2014-05-08: 2 via ORAL
  Filled 2014-05-07: qty 2

## 2014-05-07 MED ORDER — BENZOCAINE-MENTHOL 20-0.5 % EX AERO
1.0000 "application " | INHALATION_SPRAY | CUTANEOUS | Status: DC | PRN
  Administered 2014-05-07: 1 via TOPICAL
  Filled 2014-05-07: qty 56

## 2014-05-07 MED ORDER — DIPHENHYDRAMINE HCL 25 MG PO CAPS: 25.0000 mg | ORAL_CAPSULE | Freq: Four times a day (QID) | ORAL | Status: DC | PRN

## 2014-05-07 MED ORDER — DIBUCAINE 1 % RE OINT: 1.0000 "application " | TOPICAL_OINTMENT | RECTAL | Status: DC | PRN

## 2014-05-07 NOTE — Lactation Note (Signed)
This note was copied from the chart of Denise [employer]. Lactation Consultation Note  Patient Name: Denise Sandoval [id number] Date: 05/07/2014 Reason for consult: Initial assessment of this primipara and her newborn at 20 hours postpartum.  RN, Pam reports assisting mom to achieve a deeper/more comfortable latch at recent feeding and gave a LATCH score of 7.  Baby has had 5 feedings for 10-40 minutes each and 5 stools/1 void since birth.  Mom has baby latched again when [facility] arrives and wide areolar grasp and close positioning in sidelying football position observed.  Mom denies nipple pain now.  LC encouraged cue feedings and frequent STS.  Mom reports that her nurse has shown her hand expression and LC encouraged ebm on nipples after feedings. Mom encouraged to feed baby 8-12 times/24 hours and with feeding cues. LC encouraged review of Baby and Me pp 9, 14 and 20-25 for STS and BF information. LC provided [employer] brochure and reviewed [facility] services and list of community and web site resources.     Maternal Data Has patient been taught Hand Expression?: Yes Does the patient have breastfeeding experience prior to this delivery?: No  Feeding    LATCH Score/Interventions         Several LATCH scores=7 per RN assessment and LC observed a wide latch on mom's (R) breast during visit             Lactation Tools Discussed/Used   STS, cue feedings, hand expression Nipple care and signs of proper latch  Consult Status Consult Status: Follow-up Date: 05/08/14 Follow-up type: In-patient    Warrick Parisian [facility] 05/07/2014, 8:49 PM

## 2014-05-07 NOTE — Progress Notes (Signed)
UR completed 

## 2014-05-07 NOTE — Anesthesia Postprocedure Evaluation (Signed)
  Anesthesia Post-op Note  Patient: Denise Sandoval  Procedure(s) Performed: * No procedures listed *  Patient Location: PACU and Mother/Baby  Anesthesia Type:Epidural  Level of Consciousness: awake, alert , oriented and patient cooperative  Airway and Oxygen Therapy: Patient Spontanous Breathing  Post-op Pain: none  Post-op Assessment: Post-op Vital signs reviewed, Patient's Cardiovascular Status Stable, Respiratory Function Stable, Patent Airway, No signs of Nausea or vomiting, Adequate PO intake, Pain level controlled, No headache, No backache, No residual numbness and No residual motor weakness  Post-op Vital Signs: Reviewed and stable  Last Vitals:  Filed Vitals:   05/07/14 0409  BP: 106/63  Pulse: 91  Temp: 36.7 C  Resp: 18    Complications: No apparent anesthesia complications

## 2014-05-08 LAB — GC/CHLAMYDIA PROBE AMP
CT Probe RNA: NEGATIVE
GC Probe RNA: NEGATIVE

## 2014-05-08 MED ORDER — IBUPROFEN 600 MG PO TABS: 600.0000 mg | ORAL_TABLET | Freq: Four times a day (QID) | ORAL | Status: DC

## 2014-05-08 MED ORDER — RHO D IMMUNE GLOBULIN 1500 UNIT/2ML IJ SOSY
300.0000 ug | PREFILLED_SYRINGE | Freq: Once | INTRAMUSCULAR | Status: AC
  Administered 2014-05-08: 300 ug via INTRAMUSCULAR
  Filled 2014-05-08: qty 2

## 2014-05-08 NOTE — Discharge Instructions (Signed)

## 2014-05-08 NOTE — Plan of Care (Signed)
Problem: Discharge Progression Outcomes Goal: MMR given as ordered Outcome: Not Met (add Reason) Patient refused

## 2014-05-08 NOTE — Discharge Summary (Signed)
Attestation of Attending Supervision of Advanced Practitioner (CNM/NP): Evaluation and management procedures were performed by the Advanced Practitioner under my supervision and collaboration.  I have reviewed the Advanced Practitioner's note and chart, and I agree with the management and plan.  Dail Meece 05/08/2014 5:49 PM   

## 2014-05-08 NOTE — Discharge Summary (Signed)
Obstetric Discharge Summary Reason for Admission: onset of labor Prenatal Procedures: none Intrapartum Procedures: spontaneous vaginal delivery and GBS prophylaxis Postpartum Procedures: none Complications-Operative and Postpartum: 2nd degree perineal laceration Hemoglobin  Date Value Ref Range Status  05/07/2014 10.3* 12.0 - 15.0 g/dL Final     HCT  Date Value Ref Range Status  05/07/2014 30.1* 36.0 - 46.0 % Final   Ms Floor is a Denise Sandoval G1 @ 40.3wks admitted in early active labor on the evening of 9/16. She was given PCN for GBS prophylaxis as well as an epidural for labor pain. She progressed fairly quickly to SVD shortly after midnight on 9/17. By PPD#1 she is doing well and desires to be discharged home. She is breast/bottlefeeding and would like Nexplanon for contraception. While hospitalized she had a negative GC/chlam TOC from being tx for chlam at the beginning of the month.  Her preg was followed by [facility] and has been remarkable for [facility] screens throughout the preg, up until 1wk ago most recently.  Physical Exam:  General: alert, cooperative and no distress Heart: RRR Lungs: nl effort Lochia: appropriate Uterine Fundus: firm DVT Evaluation: No evidence of DVT seen on physical exam.  Discharge Diagnoses: Term Pregnancy-delivered  Discharge Information: Date: 05/08/2014 Activity: pelvic rest Diet: routine Medications: PNV and Ibuprofen Condition: stable Instructions: refer to practice specific booklet Discharge to: home Follow-up Information   Follow up with [facility]. Schedule an appointment as soon as possible for a visit in 6 weeks. (For your postpartum appointment.)    Contact information:   [address] [place] [address] [phone]      Newborn Data: Live born female  Birth Weight: 8 lb 6.4 oz (3810 g) APGAR: 8, 9  Home with mother.  Cam Hai CNM 05/08/2014, 10:48 AM

## 2014-05-08 NOTE — Progress Notes (Signed)
I have seen and examined this patient and I agree with the above. Pt to be discharged- see d/c summary. Cam Hai CNM 12:47 PM 05/08/2014

## 2014-05-08 NOTE — Progress Notes (Signed)
CSW received call from RN who stated that someone wished to speak to [place] about their concerns about the living arrangement.  CSW arrived on the unit and met with the visitor who wished to remain unanimous. CSW immediately stated that the CSW cannot confirm, deny, or share any information regarding any patient without patient's permission.  CSW statement acknowledged statement and stated that she just wanted to share information to the CSW.  CSW inquired about the visitor's level of desire to make a CPS report.  Visitor stated that they did not want to since they did not believe that the baby was in imminent danger.   CSW followed-up with [name] and FOB as RN noted that patient may have been tearful during a previous interaction.  [name] and FOB were attentive to the newborn, and the FOB was providing skin to skin.  [name] and FOB were receptive to the [place] visit. [name] confirmed that she had been tearful, and openly discussed reasons for tears.  She stated that the [facility] was upset that [name] had been smoking THC during her pregnancy, and that the MGM called the baby's great grandmother to inform her.  [name] stated that the baby's great grandmother was upset and arrived at the hospital and expressed anger about the [facility] use and that they intend to spend a couple of nights with the FOB's mother after they leave the hospital. [name] and FOB denied awareness of why the baby's great grandmother is upset about them staying with his family.  They expressed belief that she was jealous, but they could not identify any other reasons.  FOB shared belief that their home and his mother's home is safe.  [name] stated that the baby's great grandmother requested to speak to the CSW, but that she did not want her to know any information.  CSW shared confidentiality practices and discussed that staff can listen to concerns from visitors, but that information can be shared without the patient's permission.   [name] verbalized understanding.

## 2014-05-08 NOTE — Progress Notes (Signed)
Post Partum Day 1 Subjective:  Denise Sandoval is a 18 y.o. G1P1001 [redacted]w[redacted]d s/p normal spontaneous vaginal delivery.  No acute events overnight.  Pt denies problems with ambulating, voiding or po intake.  She denies nausea or vomiting.  Pain is moderately controlled.  She has had flatus. She has not had bowel movement.  Lochia moderate.  Plan for birth control is Nexplanon.  Method of Feeding: Continuing to attempt breastfeed/Bottle as needed  Objective: Blood pressure 111/64, pulse 66, temperature 97.9 F (36.6 C), temperature source Oral, resp. rate 16, last menstrual period 07/22/2013, SpO2 98.00%, unknown if currently breastfeeding.  Physical Exam:  General: alert, cooperative and no distress Lochia:normal flow Chest: CTAB Heart: RRR no m/r/g Abdomen: +BS, soft, nontender,  Uterine Fundus: firm, below umbilicus DVT Evaluation: No evidence of DVT seen on physical exam. Extremities: no peripheral edema   Recent Labs  05/06/14 2015 05/07/14 0555  HGB 11.6* 10.3*  HCT 33.4* 30.1*    Assessment/Plan:  ASSESSMENT: Denise Sandoval is a 18 y.o. G1P1001 [redacted]w[redacted]d s/p NSVD  Discharge home, Breastfeeding and Contraception : Nexplanon   LOS: 2 days   Azucena Cecil, PA-S2 05/08/2014, 7:30 AM

## 2014-05-08 NOTE — Progress Notes (Signed)
Clinical Social Work Department PSYCHOSOCIAL ASSESSMENT - MATERNAL/CHILD 05/08/2014  Patient:  Denise Sandoval, Denise Sandoval  Account Number:  [id number]  [place] Date:  05/06/2014  Ardine Eng Name:   Denise Sandoval   Clinical Social Worker:  Lucita Ferrara, CLINICAL SOCIAL WORKER   Date/Time:  05/08/2014 10:00 AM  Date Referred:  05/07/2014   Referral source  Central Nursery     Referred reason  Substance Abuse   Other referral source:    I:  FAMILY / HOME ENVIRONMENT Child's legal guardian:  PARENT  Guardian - Name Guardian - Age Guardian - Address  [address] [place], [place] [address]  Annette Stable  same as above   Other household support members/support persons Other support:   [name] stated that her grandmother is very supportive.  She shared that she lives within a few minutes of their home, and stated that her grandmother was her primary caregiver growing up due to her mother's etoh abuse.    II  PSYCHOSOCIAL DATA Information Source:  Family Interview  Financial and [employer] Employment:   [name] stated that she is currently not working.  FOB is currently working 3rd shift and feels well supported by his employers.   Financial resources:  Medicaid If Medicaid - County:  [employer] Other  [place] / Grade:  N/A Music therapist / Child Services Coordination / Early Interventions:   N/A  Cultural issues impacting care:   None reported    III  STRENGTHS Strengths  Adequate Resources  Home prepared for Child (including basic supplies)  Supportive family/friends   Strength comment:    IV  RISK FACTORS AND CURRENT PROBLEMS Current Problem:  YES   Risk Factor & Current Problem Patient Issue Family Issue Risk Factor / Current Problem Comment  Substance Abuse Y N [name] presents with THC use during her pregnancy.  [name] had positive UDS for [facility] in February, June, and September.  Baby's UDS and meconium are pending.    V  SOCIAL WORK  ASSESSMENT CSW met with [name] in her room in order to complete the assessment.  Consult was ordered due to [name]'s THC use during her pregnancy.  CSW began assessment with [name] alone in her room, but the [facility] and the FOB arrived during the visit.  [name] provided consent for CSW to continue in their presence.  MGM participated minimally, but [name] and FOB were easily engaged and receptive to the visit.  [name] and FOB shared that they had been anticipating the visit from the [place], and they asked appropriate questions related to CPS involvement if the baby's toxicology screens are positive.  [name] presented as attentive and engaged with her newborn, and smiled frequently as she reflected upon her feelings of being a new mother.  Per [name], she and the FOB live alone in their home.  She stated that they have been living together for 2 years and are excited to become first time parents.  [name] acknowledged feeling overwhelmed when she first learned that she was pregnant since she had been planning to be in nursing school, but that the feelings quickly changed to feeling excited. Per [name], she is "in love" with her daughter, the home is prepared, and they feel supported by their family and friends.  FOB and [name] discussed their plans to increase support during the first few weeks at home since he works 3rd shift.  [name] stated that she can stay with her grandmother (the baby's great grandmother) if  needed since she lives within short driving distance of their home.    CSW provided education on postpartum depression.  [name] verbalized understanding and denied any prior mental health history.  She originally stated, "that will not happen to me", but she acknowledged that postpartum depression can occur to anyone.  [name] and FOB verbalized understanding of importance of talking to their MD if [name] experiences symptoms.   [name] acknowledged THC use during her pregnancy.  She stated that she used THC to assist with nausea and physical pain.  [name]  denied any other substance use. CSW provided education on drug screen policy. [name] and FOB verbalized understanding that a CPS report will be made if the baby's drug screens are positive.  [name] and FOB inquired about what occurs if CPS becomes involved.  CSW explained normative CPS involvement, and they denied any additional questions or concerns.  CSW noted that [name] presented as anxious when CSW discussed CPS.  [name] disclosed history of CPS when she was a child since her mother is an alcoholic. [name] shared with the CSW the emotional experiences that occurred since she was placed with her grandmother and there were ongoing issues related to custody.  The MGM was in the room when the [name] shared this, and the MGM acknowledged that alcohol use has been a problem for her.  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  CSW validated [name]'s anxiety, and assisted [name] to self-regulate her emotions by focusing on the present moment and the experiences she is enjoying in the hospital with her daughter.    No barriers at this time; however, UDS is still pending.  VI SOCIAL WORK PLAN Social Work Therapist, art  No Further Intervention Required / No Barriers to Discharge   Type of pt/family education:   Postpartum depression  Hospital drug screen policy   If child protective services report - county:   If child protective services report - date:   Information/referral to community resources comment:   Other social work plan:   CSW to provide emotional support PRN.  CSW to monitor UDS and meconium drug screen and will make report if needed.

## 2014-05-09 LAB — RH IG WORKUP (INCLUDES ABO/RH)
ABO/RH(D): O NEG
Fetal Screen: NEGATIVE
Gestational Age(Wks): 40.3
UNIT DIVISION: 0

## 2014-05-10 NOTE — MAU Provider Note (Signed)
Attestation of Attending Supervision of Advanced Practitioner: Evaluation and management procedures were performed by the PA/NP/CNM/OB Fellow under my supervision/collaboration. Chart reviewed and agree with management and plan.  Kerria Sapien V 05/10/2014 9:07 PM

## 2014-05-11 ENCOUNTER — Emergency Department (HOSPITAL_COMMUNITY)
Admission: EM | Admit: 2014-05-11 | Discharge: 2014-05-11 | Discharge status: Against Medical Advice | Payer: Medicaid Other | Attending: Emergency Medicine | Admitting: Emergency Medicine

## 2014-05-11 ENCOUNTER — Encounter (HOSPITAL_COMMUNITY): Payer: Self-pay | Admitting: Emergency Medicine

## 2014-05-11 DIAGNOSIS — O909 Complication of the puerperium, unspecified: Principal | ICD-10-CM

## 2014-05-11 DIAGNOSIS — O26899 Other specified pregnancy related conditions, unspecified trimester: Secondary | ICD-10-CM | POA: Insufficient documentation

## 2014-05-11 DIAGNOSIS — Z87448 Personal history of other diseases of urinary system: Secondary | ICD-10-CM | POA: Diagnosis not present

## 2014-05-11 DIAGNOSIS — Z87891 Personal history of nicotine dependence: Secondary | ICD-10-CM | POA: Insufficient documentation

## 2014-05-11 DIAGNOSIS — R109 Unspecified abdominal pain: Secondary | ICD-10-CM

## 2014-05-11 DIAGNOSIS — Z79899 Other long term (current) drug therapy: Secondary | ICD-10-CM | POA: Insufficient documentation

## 2014-05-11 LAB — CBC WITH DIFFERENTIAL/PLATELET
Basophils Absolute: 0 10*3/uL (ref 0.0–0.1)
Basophils Relative: 0 % (ref 0–1)
EOS ABS: 0.4 10*3/uL (ref 0.0–0.7)
EOS PCT: 5 % (ref 0–5)
HCT: 31.2 % — ABNORMAL LOW (ref 36.0–46.0)
Hemoglobin: 10.8 g/dL — ABNORMAL LOW (ref 12.0–15.0)
LYMPHS ABS: 2.1 10*3/uL (ref 0.7–4.0)
Lymphocytes Relative: 24 % (ref 12–46)
MCH: 31.2 pg (ref 26.0–34.0)
MCHC: 34.6 g/dL (ref 30.0–36.0)
MCV: 90.2 fL (ref 78.0–100.0)
Monocytes Absolute: 0.7 10*3/uL (ref 0.1–1.0)
Monocytes Relative: 8 % (ref 3–12)
Neutro Abs: 5.4 10*3/uL (ref 1.7–7.7)
Neutrophils Relative %: 63 % (ref 43–77)
PLATELETS: 324 10*3/uL (ref 150–400)
RBC: 3.46 MIL/uL — AB (ref 3.87–5.11)
RDW: 13 % (ref 11.5–15.5)
WBC: 8.6 10*3/uL (ref 4.0–10.5)

## 2014-05-11 LAB — BASIC METABOLIC PANEL
Anion gap: 11 (ref 5–15)
BUN: 10 mg/dL (ref 6–23)
CO2: 25 mEq/L (ref 19–32)
Calcium: 8.7 mg/dL (ref 8.4–10.5)
Chloride: 103 mEq/L (ref 96–112)
Creatinine, Ser: 0.54 mg/dL (ref 0.50–1.10)
GFR calc Af Amer: >90 mL/min (ref >90)
GLUCOSE: 104 mg/dL — AB (ref 70–99)
Potassium: 3.6 mEq/L — ABNORMAL LOW (ref 3.7–5.3)
SODIUM: 139 meq/L (ref 137–147)

## 2014-05-11 NOTE — ED Notes (Signed)
Pt c/o anxiety due to leaving her baby for this long, pt states that she plans to see her doctor tomorrow morning, EDP made aware of pt wanting to leave AMA, pt signed AMA form and ambulated out with steady gait

## 2014-05-11 NOTE — ED Notes (Signed)
Pt with vaginal delivery without complications per pt on 9/17, pt states bleeding has slowed down today but abd cramping has increased today

## 2014-05-11 NOTE — ED Notes (Signed)
Pt had vaginal delivery on 05/07/14 stiches due to tearing. Pt c/o lower abd pain and constipation. Pt states she she is having to change her maxi-pad every one hour d/t heavy bleeding with clots. Pt states she feels "tense and weird". Pt slightly pale. nad noted.

## 2014-05-11 NOTE — ED Provider Notes (Signed)
CSN: [id number]     Arrival date & time 05/11/14  1414 History     This chart was scribed for Donnetta Hutching, MD by Freida Busman, ED Scribe. This patient was seen in room APA05/APA05 and the patient's care was started 3:15 PM.  Chief Complaint  Patient presents with  . Postpartum Complications     The history is provided by the patient. No language interpreter was used.   HPI Comments:  Denise Sandoval is a 18 y.o. female G1P1Ab0 who presents to the Emergency Department complaining of moderate lower abdominal cramping for 4 days. Pt had a NSVD 4 days ago and was discharged home 2 days later with   motrin which she has been taking with no relief. Pt also reports associated constipation.  She has not had a BM since being discharged home.Pt denies heavy vaginal bleeding or discharge. Pt called doctor's office and was advised to come to the ED to be evaluated.       Past Medical History  Diagnosis Date  . Supervision of normal first pregnancy in first trimester 09/23/2013  . Kidney infection    Past Surgical History  Procedure Laterality Date  . No past surgeries     Family History  Problem Relation Age of Onset  . Alcohol abuse Mother   . Alcohol abuse Father   . Hypertension Maternal Grandmother   . Alcohol abuse Maternal Grandfather    History  Substance Use Topics  . Smoking status: Former Games developer  . Smokeless tobacco: Never Used  . Alcohol Use: No   OB History   Grav Para Term Preterm Abortions TAB SAB Ect Mult Living   Review of Systems  At least 10pt or greater review of systems completed and are negative except where specified in the HPI.   Allergies  Review of patient's allergies indicates no known allergies.  Home Medications   Prior to Admission medications   Medication Sig Start Date End Date Taking? Authorizing Provider  ibuprofen (ADVIL,MOTRIN) 600 MG tablet Take 1 tablet (600 mg total) by mouth every 6 (six) hours. 05/08/14  Yes  Arabella Merles, CNM  loratadine (CLARITIN) 10 MG tablet Take 10 mg by mouth daily as needed for allergies.   Yes Historical Provider, MD  Prenatal Vit-Fe Fumarate-FA (PRENATAL MULTIVITAMIN) TABS tablet Take 1 tablet by mouth daily at 12 noon.   Yes Historical Provider, MD   BP 102/70  Pulse 66  Temp(Src) 97.5 F (36.4 C) (Oral)  Resp 18  Ht  (1.727 m)  Wt 160 lb (72.576 kg)  BMI 24.33 kg/m2  SpO2 100%  LMP 07/22/2013 Physical Exam  Nursing note and vitals reviewed. Constitutional: She is oriented to person, place, and time. She appears well-developed and well-nourished.  HENT:  Head: Normocephalic and atraumatic.  Eyes: Conjunctivae and EOM are normal. Pupils are equal, round, and reactive to light.  Neck: Normal range of motion. Neck supple.  Cardiovascular: Normal rate, regular rhythm and normal heart sounds.   Pulmonary/Chest: Effort normal and breath sounds normal.  Abdominal: There is tenderness.  Minimal suprapubic tenderness  Genitourinary:  Not examined.  Musculoskeletal: Normal range of motion.  Neurological: She is alert and oriented to person, place, and time.  Skin: Skin is warm and dry.  Psychiatric: She has a normal mood and affect. Her behavior is normal.    ED Course  Procedures (including critical care time) DIAGNOSTIC STUDIES:  Oxygen Saturation is 100% on RA, normal by my interpretation.    COORDINATION OF CARE:  3:18 PM Discussed treatment plan with pt at bedside and pt agreed to plan.   Labs Review Labs Reviewed  CBC WITH DIFFERENTIAL - Abnormal; Notable for the following:    RBC 3.46 (*)    Hemoglobin 10.8 (*)    HCT 31.2 (*)    All other components within normal limits  BASIC METABOLIC PANEL - Abnormal; Notable for the following:    Potassium 3.6 (*)    Glucose, Bld 104 (*)    All other components within normal limits    Imaging Review No results found.   EKG Interpretation None      MDM   Final diagnoses:  Abdominal  pain, unspecified abdominal location   No acute abdomen. Normal vital signs. Patient left AMA prior to pelvic exam.  I personally performed the services described in this documentation, which was scribed in my presence. The recorded information has been reviewed and is accurate.    Donnetta Hutching, MD 05/11/14 [phone]

## 2014-05-12 ENCOUNTER — Inpatient Hospital Stay (HOSPITAL_COMMUNITY): Admission: RE | Admit: 2014-05-12 | Payer: Medicaid Other | Source: Ambulatory Visit

## 2014-05-13 ENCOUNTER — Ambulatory Visit (INDEPENDENT_AMBULATORY_CARE_PROVIDER_SITE_OTHER): Payer: Medicaid Other | Admitting: Adult Health

## 2014-05-13 ENCOUNTER — Encounter: Payer: Self-pay | Admitting: Adult Health

## 2014-05-13 ENCOUNTER — Ambulatory Visit (INDEPENDENT_AMBULATORY_CARE_PROVIDER_SITE_OTHER): Payer: Medicaid Other

## 2014-05-13 VITALS — BP 112/68 | Temp 98.3°F | Ht 68.0 in | Wt 154.5 lb

## 2014-05-13 DIAGNOSIS — R1031 Right lower quadrant pain: Secondary | ICD-10-CM | POA: Insufficient documentation

## 2014-05-13 DIAGNOSIS — O8612 Endometritis following delivery: Secondary | ICD-10-CM

## 2014-05-13 HISTORY — DX: Endometritis following delivery: O86.12

## 2014-05-13 HISTORY — DX: Right lower quadrant pain: R10.31

## 2014-05-13 LAB — POCT URINALYSIS DIPSTICK
GLUCOSE UA: NEGATIVE
Leukocytes, UA: NEGATIVE
NITRITE UA: NEGATIVE

## 2014-05-13 LAB — CBC WITH DIFFERENTIAL/PLATELET
Basophils Absolute: 0.1 10*3/uL (ref 0.0–0.1)
Basophils Relative: 1 % (ref 0–1)
EOS PCT: 3 % (ref 0–5)
Eosinophils Absolute: 0.3 10*3/uL (ref 0.0–0.7)
HCT: 34.8 % — ABNORMAL LOW (ref 36.0–46.0)
HEMOGLOBIN: 12.1 g/dL (ref 12.0–15.0)
LYMPHS ABS: 2.3 10*3/uL (ref 0.7–4.0)
Lymphocytes Relative: 27 % (ref 12–46)
MCH: 31.1 pg (ref 26.0–34.0)
MCHC: 34.8 g/dL (ref 30.0–36.0)
MCV: 89.5 fL (ref 78.0–100.0)
MONOS PCT: 7 % (ref 3–12)
Monocytes Absolute: 0.6 10*3/uL (ref 0.1–1.0)
Neutro Abs: 5.2 10*3/uL (ref 1.7–7.7)
Neutrophils Relative %: 62 % (ref 43–77)
Platelets: 406 10*3/uL — ABNORMAL HIGH (ref 150–400)
RBC: 3.89 MIL/uL (ref 3.87–5.11)
RDW: 13.7 % (ref 11.5–15.5)
WBC: 8.4 10*3/uL (ref 4.0–10.5)

## 2014-05-13 MED ORDER — HYDROCODONE-ACETAMINOPHEN 5-325 MG PO TABS: 1.0000 | ORAL_TABLET | ORAL | Status: DC | PRN

## 2014-05-13 MED ORDER — AMOXICILLIN-POT CLAVULANATE 875-125 MG PO TABS: 1.0000 | ORAL_TABLET | Freq: Two times a day (BID) | ORAL | Status: DC

## 2014-05-13 NOTE — Progress Notes (Signed)
Subjective:     Patient ID: Denise Sandoval, female   DOB: September 02, 1995, 18 y.o.   MRN: [id number]  HPI Baneen is a 18 year old white female, worked in for RLQ pain that was a 10 about 30 minutes ago, has been about a 4 since delivery.Delivered 05/07/14 and had stitches.No fever or vomiting.No sex.Has history of +Chlamydia and pyleo.  Review of Systems See HPI Reviewed past medical,surgical, social and family history. Reviewed medications and allergies.     Objective:   Physical Exam BP 112/68  Temp(Src) 98.3 F (36.8 C)  Ht 5' 8"  (1.727 m)  Wt 154 lb 8 oz (70.081 kg)  BMI 23.50 kg/m2  LMP 07/22/2013  Breastfeeding? Nourine 3+blood 1+ protein, Skin warm and dry.Pelvic: external genitalia is mildly swollen with healing stitches in place, vagina: normal lochia,No odor cervix:smooth and bulbous +CMT, uterus: normal size, shape and contour, tender, no masses felt, adnexa: no masses, RLQ  tenderness noted. No CVAT.Dr Glo Herring in for co exam. GC/CHL obtained. US obtained: Uterus 11.4 x 10.7 x 7.2 cm, anteverted  Endometrium Unable to clearly identify endometrial cavity (s/p SVD 6 days)  Right ovary 3.5 x 2.5 x 1.8 cm,  Left ovary 3.6 x 2.9 x 1.8 cm,  No free fluid or adnexal masses noted within the pelvis  Technician Comments:  Anteverted uterus, bilateral adnexa/ovaries appear WNL no free fluid or adnexal masses noted within the pelvis        Assessment:     RLQ pain Postpartum endometritis     Plan:     Check CBC with diff, ESR, UA C&S and GC/CHL Rx Augmentin 875 mg #20 1 bid x 10 days Rx norco 5-325 mg #30 1 every 4 hours prn pain Return in 1 day for follow up Go to ER if fever over 100.6,vomiting or increase in pain

## 2014-05-13 NOTE — Progress Notes (Deleted)
Patient ID: Denise Sandoval, female   DOB: April 20, 1996, 18 y.o.   MRN: [id number]

## 2014-05-13 NOTE — Patient Instructions (Signed)
If fever over 100.6 or increase in pain go to ER Follow up tomorrow for [place] and see me Take antibiotic  Increase fluids

## 2014-05-14 ENCOUNTER — Ambulatory Visit: Payer: Medicaid Other | Admitting: Adult Health

## 2014-05-14 LAB — URINALYSIS
Bilirubin Urine: NEGATIVE
Glucose, UA: NEGATIVE mg/dL
KETONES UR: NEGATIVE mg/dL
LEUKOCYTES UA: NEGATIVE
NITRITE: NEGATIVE
PH: 8 (ref 5.0–8.0)
Protein, ur: NEGATIVE mg/dL
SPECIFIC GRAVITY, URINE: 1.02 (ref 1.005–1.030)
UROBILINOGEN UA: 0.2 mg/dL (ref 0.0–1.0)

## 2014-05-14 LAB — GC/CHLAMYDIA PROBE AMP
CT PROBE, AMP APTIMA: NEGATIVE
GC Probe RNA: NEGATIVE

## 2014-05-14 LAB — SEDIMENTATION RATE: Sed Rate: 30 mm/hr — ABNORMAL HIGH (ref 0–22)

## 2014-05-15 ENCOUNTER — Encounter: Payer: Self-pay | Admitting: *Deleted

## 2014-05-15 ENCOUNTER — Ambulatory Visit: Payer: Medicaid Other | Admitting: Adult Health

## 2014-05-15 ENCOUNTER — Telehealth: Payer: Self-pay | Admitting: Adult Health

## 2014-05-15 LAB — URINE CULTURE
COLONY COUNT: NO GROWTH
Organism ID, Bacteria: NO GROWTH

## 2014-05-15 NOTE — Telephone Encounter (Signed)
Pt missed appt today, she is feeling much better and is aware of labs will reschedule appt to Tuesday

## 2014-05-19 ENCOUNTER — Encounter: Payer: Self-pay | Admitting: Adult Health

## 2014-05-19 ENCOUNTER — Ambulatory Visit (INDEPENDENT_AMBULATORY_CARE_PROVIDER_SITE_OTHER): Payer: Medicaid Other | Admitting: Adult Health

## 2014-05-19 VITALS — BP 130/68 | Ht 68.0 in | Wt 152.0 lb

## 2014-05-19 DIAGNOSIS — O8612 Endometritis following delivery: Secondary | ICD-10-CM

## 2014-05-19 NOTE — Patient Instructions (Signed)
Follow up as scheduled Finish meds

## 2014-05-19 NOTE — Progress Notes (Signed)
Subjective:     Patient ID: Denise Sandoval, female   DOB: 02/12/1996, 18 y.o.   MRN: [id number]  HPI Denise Sandoval is a 18 year old white female back in follow up of abdominal pain with endometritis, is taking meds and feels much better.   Review of Systems See HPI Reviewed past medical,surgical, social and family history. Reviewed medications and allergies.     Objective:   Physical Exam BP 130/68  Ht 5\' 8"  (1.727 m)  Wt 152 lb (68.947 kg)  BMI 23.12 kg/m2  LMP 07/22/2013  Breastfeeding? NoSkin warm and dry, no tenderness on palpation of abdomen, reviewed labs with pt.    Pt is smiling and looks good today.  Assessment:     Resolved postpartum endometritis     Plan:     Finish meds Follow up for post partum visit as scheduled

## 2014-06-16 ENCOUNTER — Ambulatory Visit (INDEPENDENT_AMBULATORY_CARE_PROVIDER_SITE_OTHER): Payer: Medicaid Other | Admitting: Women's Health

## 2014-06-16 ENCOUNTER — Encounter: Payer: Self-pay | Admitting: Women's Health

## 2014-06-16 DIAGNOSIS — N390 Urinary tract infection, site not specified: Secondary | ICD-10-CM

## 2014-06-16 DIAGNOSIS — F53 Postpartum depression: Secondary | ICD-10-CM

## 2014-06-16 DIAGNOSIS — O98819 Other maternal infectious and parasitic diseases complicating pregnancy, unspecified trimester: Secondary | ICD-10-CM

## 2014-06-16 DIAGNOSIS — A749 Chlamydial infection, unspecified: Secondary | ICD-10-CM

## 2014-06-16 DIAGNOSIS — O99345 Other mental disorders complicating the puerperium: Secondary | ICD-10-CM

## 2014-06-16 DIAGNOSIS — R300 Dysuria: Secondary | ICD-10-CM

## 2014-06-16 LAB — POCT URINALYSIS DIPSTICK
Glucose, UA: NEGATIVE
Ketones, UA: NEGATIVE
Nitrite, UA: NEGATIVE

## 2014-06-16 MED ORDER — CEPHALEXIN 500 MG PO CAPS: 500.0000 mg | ORAL_CAPSULE | Freq: Four times a day (QID) | ORAL | Status: DC

## 2014-06-16 MED ORDER — ESCITALOPRAM OXALATE 10 MG PO TABS: 10.0000 mg | ORAL_TABLET | Freq: Every day | ORAL | Status: DC

## 2014-06-16 MED ORDER — CIPROFLOXACIN HCL 500 MG PO TABS: 500.0000 mg | ORAL_TABLET | Freq: Two times a day (BID) | ORAL | Status: DC

## 2014-06-16 NOTE — Patient Instructions (Signed)
Postpartum Depression and Baby Blues The postpartum period begins right after the birth of a baby. During this time, there is often a great amount of joy and excitement. It is also a time of many changes in the life of the parents. Regardless of how many times a mother gives birth, each child brings new challenges and dynamics to the family. It is not unusual to have feelings of excitement along with confusing shifts in moods, emotions, and thoughts. All mothers are at risk of developing postpartum depression or the "baby blues." These mood changes can occur right after giving birth, or they may occur many months after giving birth. The baby blues or postpartum depression can be mild or severe. Additionally, postpartum depression can go away rather quickly, or it can be a long-term condition.  CAUSES Raised hormone levels and the rapid drop in those levels are thought to be a main cause of postpartum depression and the baby blues. A number of hormones change during and after pregnancy. Estrogen and progesterone usually decrease right after the delivery of your baby. The levels of thyroid hormone and various cortisol steroids also rapidly drop. Other factors that play a role in these mood changes include major life events and genetics.  RISK FACTORS If you have any of the following risks for the baby blues or postpartum depression, know what symptoms to watch out for during the postpartum period. Risk factors that may increase the likelihood of getting the baby blues or postpartum depression include:  Having a personal or family history of depression.   Having depression while being pregnant.   Having premenstrual mood issues or mood issues related to oral contraceptives.  Having a lot of life stress.   Having marital conflict.   Lacking a social support network.   Having a baby with special needs.   Having health problems, such as diabetes.  SIGNS AND SYMPTOMS Symptoms of baby blues  include:  Brief changes in mood, such as going from extreme happiness to sadness.  Decreased concentration.   Difficulty sleeping.   Crying spells, tearfulness.   Irritability.   Anxiety.  Symptoms of postpartum depression typically begin within the first month after giving birth. These symptoms include:  Difficulty sleeping or excessive sleepiness.   Marked weight loss.   Agitation.   Feelings of worthlessness.   Lack of interest in activity or food.  Postpartum psychosis is a very serious condition and can be dangerous. Fortunately, it is rare. Displaying any of the following symptoms is cause for immediate medical attention. Symptoms of postpartum psychosis include:   Hallucinations and delusions.   Bizarre or disorganized behavior.   Confusion or disorientation.  DIAGNOSIS  A diagnosis is made by an evaluation of your symptoms. There are no medical or lab tests that lead to a diagnosis, but there are various questionnaires that a health care provider may use to identify those with the baby blues, postpartum depression, or psychosis. Often, a screening tool called the Edinburgh Postnatal Depression Scale is used to diagnose depression in the postpartum period.  TREATMENT The baby blues usually goes away on its own in 1-2 weeks. Social support is often all that is needed. You will be encouraged to get adequate sleep and rest. Occasionally, you may be given medicines to help you sleep.  Postpartum depression requires treatment because it can last several months or longer if it is not treated. Treatment may include individual or group therapy, medicine, or both to address any social, physiological, and psychological   factors that may play a role in the depression. Regular exercise, a healthy diet, rest, and social support may also be strongly recommended.  Postpartum psychosis is more serious and needs treatment right away. Hospitalization is often needed. HOME CARE  INSTRUCTIONS  Get as much rest as you can. Nap when the baby sleeps.   Exercise regularly. Some women find yoga and walking to be beneficial.   Eat a balanced and nourishing diet.   Do little things that you enjoy. Have a cup of tea, take a bubble bath, read your favorite magazine, or listen to your favorite music.  Avoid alcohol.   Ask for help with household chores, cooking, grocery shopping, or running errands as needed. Do not try to do everything.   Talk to people close to you about how you are feeling. Get support from your partner, family members, friends, or other new moms.  Try to stay positive in how you think. Think about the things you are grateful for.   Do not spend a lot of time alone.   Only take over-the-counter or prescription medicine as directed by your health care provider.  Keep all your postpartum appointments.   Let your health care provider know if you have any concerns.  SEEK MEDICAL CARE IF: You are having a reaction to or problems with your medicine. SEEK IMMEDIATE MEDICAL CARE IF:  You have suicidal feelings.   You think you may harm the baby or someone else. MAKE SURE YOU:  Understand these instructions.  Will watch your condition.  Will get help right away if you are not doing well or get worse. Document Released: 05/11/2004 Document Revised: 08/12/2013 Document Reviewed: 05/19/2013 ExitCare Patient Information 2015 [employer]. This information is not intended to replace advice given to you by your health care provider. Make sure you discuss any questions you have with your health care provider.  

## 2014-06-16 NOTE — Progress Notes (Signed)
Patient ID: Denise Sandoval Denise Sandoval, female   DOB: 09-26-1995, 18 y.o.   MRN: [id number] Subjective:    Denise Sandoval is a 18 y.o. 681P1001 Caucasian female who presents for a postpartum visit. She is 5 weeks postpartum following a spontaneous vaginal delivery at 40.3 gestational weeks. Anesthesia: epidural. I have fully reviewed the prenatal and intrapartum course. Postpartum course has been complicated by ppd. Baby's course has been uncomplicated. Baby is feeding by bottle. Bleeding no bleeding. Bowel function is normal. Bladder function is normal. Patient is sexually active. Last sexual activity: 06/13/14. Contraception method is desires nexplanon- understands can worsen depression. Postpartum depression screening: positive. Score 22. Not eating or sleeping well, no support, no joy in things she used to find joy in, does enjoy being w/ baby but feels overwhelmed b/c she has no help as fob works 3rd shift. Denies SI/HI/II.  Last pap <21yo. +dysuria and Rt flank pain, no fever/chills, had multiple utis w/ pregnancy.   The following portions of the patient's history were reviewed and updated as appropriate: allergies, current medications, past medical history, past surgical history and problem list.  Review of Systems Pertinent items are noted in HPI.   Filed Vitals:   06/16/14 1435  BP: 108/76  Height: 5\' 8"  (1.727 m)  Weight: 154 lb (69.854 kg)   No LMP recorded.  Objective:   General:  alert, cooperative and no distress   Breasts:  deferred, no complaints  Lungs: clear to auscultation bilaterally  Heart:  regular rate and rhythm  Abdomen: soft, nontender   Vulva: normal  Vagina: normal vagina  Cervix:  closed  Corpus: Well-involuted  Adnexa:  Non-palpable  Rectal Exam: No hemorrhoids        Assessment:   Postpartum exam 5 wks s/p SVD Bottlefeeding Depression screening PPD Contraception counseling  UTI  Plan:   Contraception: abstinence until nexplanon placed Rx keflex for UTI,  will send urine cx Rx lexapro 10mg  daily for PPD- knows can take 3-4wks to start working, referral to [facility] families sent, discussed ways to alleviate stress Follow up in: 11/9 for upt and nexplanon placement, or earlier if needed To call us for any worsening sx  Marge DuncansBooker, Honestie Kulik Randall CNM, [facility] IrvineWHNP-BC 06/16/2014 2:45 PM

## 2014-06-19 LAB — URINE CULTURE: Colony Count: >=100000

## 2014-06-22 ENCOUNTER — Encounter: Payer: Self-pay | Admitting: Women's Health

## 2014-06-30 ENCOUNTER — Encounter: Payer: Self-pay | Admitting: *Deleted

## 2014-06-30 ENCOUNTER — Encounter: Payer: Medicaid Other | Admitting: Women's Health

## 2014-07-21 ENCOUNTER — Emergency Department (HOSPITAL_COMMUNITY)
Admission: EM | Admit: 2014-07-21 | Discharge: 2014-07-22 | Disposition: A | Discharge status: Home / Self Care | Payer: MEDICAID | Attending: Emergency Medicine | Admitting: Emergency Medicine

## 2014-07-21 ENCOUNTER — Encounter (HOSPITAL_COMMUNITY): Payer: Self-pay | Admitting: Emergency Medicine

## 2014-07-21 DIAGNOSIS — F1510 Other stimulant abuse, uncomplicated: Secondary | ICD-10-CM | POA: Insufficient documentation

## 2014-07-21 DIAGNOSIS — F1110 Opioid abuse, uncomplicated: Secondary | ICD-10-CM | POA: Diagnosis not present

## 2014-07-21 DIAGNOSIS — Z79899 Other long term (current) drug therapy: Secondary | ICD-10-CM | POA: Insufficient documentation

## 2014-07-21 DIAGNOSIS — F1210 Cannabis abuse, uncomplicated: Secondary | ICD-10-CM | POA: Diagnosis not present

## 2014-07-21 DIAGNOSIS — Z720 Tobacco use: Secondary | ICD-10-CM | POA: Diagnosis not present

## 2014-07-21 DIAGNOSIS — F1410 Cocaine abuse, uncomplicated: Secondary | ICD-10-CM | POA: Diagnosis not present

## 2014-07-21 DIAGNOSIS — F1310 Sedative, hypnotic or anxiolytic abuse, uncomplicated: Secondary | ICD-10-CM | POA: Diagnosis not present

## 2014-07-21 DIAGNOSIS — F1910 Other psychoactive substance abuse, uncomplicated: Secondary | ICD-10-CM

## 2014-07-21 DIAGNOSIS — Z87448 Personal history of other diseases of urinary system: Secondary | ICD-10-CM | POA: Insufficient documentation

## 2014-07-21 DIAGNOSIS — Z008 Encounter for other general examination: Secondary | ICD-10-CM | POA: Diagnosis present

## 2014-07-21 DIAGNOSIS — Z792 Long term (current) use of antibiotics: Secondary | ICD-10-CM | POA: Diagnosis not present

## 2014-07-21 LAB — COMPREHENSIVE METABOLIC PANEL
ALK PHOS: 81 U/L (ref 39–117)
ALT: 8 U/L (ref 0–35)
AST: 13 U/L (ref 0–37)
Albumin: 4.4 g/dL (ref 3.5–5.2)
Anion gap: 12 (ref 5–15)
BUN: 13 mg/dL (ref 6–23)
CO2: 26 mEq/L (ref 19–32)
Calcium: 9.5 mg/dL (ref 8.4–10.5)
Chloride: 100 mEq/L (ref 96–112)
Creatinine, Ser: 0.92 mg/dL (ref 0.50–1.10)
GFR calc non Af Amer: 89 mL/min — ABNORMAL LOW (ref >90)
Glucose, Bld: 98 mg/dL (ref 70–99)
Potassium: 3.9 mEq/L (ref 3.7–5.3)
Sodium: 138 mEq/L (ref 137–147)
TOTAL PROTEIN: 7.7 g/dL (ref 6.0–8.3)
Total Bilirubin: 0.6 mg/dL (ref 0.3–1.2)

## 2014-07-21 LAB — CBC
HEMATOCRIT: 38.2 % (ref 36.0–46.0)
Hemoglobin: 12.7 g/dL (ref 12.0–15.0)
MCH: 29.5 pg (ref 26.0–34.0)
MCHC: 33.2 g/dL (ref 30.0–36.0)
MCV: 88.6 fL (ref 78.0–100.0)
Platelets: 294 10*3/uL (ref 150–400)
RBC: 4.31 MIL/uL (ref 3.87–5.11)
RDW: 13.1 % (ref 11.5–15.5)
WBC: 6.4 10*3/uL (ref 4.0–10.5)

## 2014-07-21 LAB — ACETAMINOPHEN LEVEL

## 2014-07-21 LAB — RAPID URINE DRUG SCREEN, HOSP PERFORMED
Amphetamines: POSITIVE — AB
BARBITURATES: NOT DETECTED
BENZODIAZEPINES: POSITIVE — AB
Cocaine: POSITIVE — AB
OPIATES: POSITIVE — AB
Tetrahydrocannabinol: POSITIVE — AB

## 2014-07-21 LAB — SALICYLATE LEVEL: Salicylate Lvl: <2 mg/dL — ABNORMAL LOW (ref 2.8–20.0)

## 2014-07-21 LAB — ETHANOL

## 2014-07-21 MED ORDER — ZOLPIDEM TARTRATE 5 MG PO TABS
5.0000 mg | ORAL_TABLET | Freq: Once | ORAL | Status: AC
  Administered 2014-07-21: 5 mg via ORAL
  Filled 2014-07-21: qty 1

## 2014-07-21 MED ORDER — ACETAMINOPHEN 500 MG PO TABS
1000.0000 mg | ORAL_TABLET | Freq: Once | ORAL | Status: AC
  Administered 2014-07-21: 1000 mg via ORAL
  Filled 2014-07-21: qty 2

## 2014-07-21 MED ORDER — DIPHENHYDRAMINE HCL 25 MG PO CAPS
25.0000 mg | ORAL_CAPSULE | Freq: Once | ORAL | Status: AC
  Administered 2014-07-21: 25 mg via ORAL
  Filled 2014-07-21: qty 1

## 2014-07-21 NOTE — ED Notes (Signed)
Pt. Updated on placement status. Pt. Became tearful and requesting something to help her sleep. EDP notified.

## 2014-07-21 NOTE — ED Notes (Signed)
Pt reports wants detox from benzo and opiates. Pt reports last use this am. Pt denies any SI/HI. Pt reports n/v/d. Pt tearful but cooperative in triage. nad noted.

## 2014-07-21 NOTE — ED Notes (Signed)
Pt. Requesting Tylenol.

## 2014-07-21 NOTE — ED Notes (Signed)
Pt states she wants help coming off of benzos and opiates. Pt states she has had a problem for a year. Per pt, she last took opiates oxycontin and xanax this morning and is starting to feel nauseous, skin tingling, she is having diarrhea, and a headache. Pt denies any AVH. Denies SI/HI.

## 2014-07-21 NOTE — ED Notes (Signed)
Pt requested medication to get rid of "tingly feeling all over." Dr. Judd Lienelo made aware of pt requests.

## 2014-07-21 NOTE — BH Assessment (Signed)
Spoke to Dr.Delo to obtain clinicals prior to assessing patient. Per EDP pt is presenting requesting SA treatment.  Glorious PeachNajah Kamare Caspers, MS, LCASA Assessment Counselor

## 2014-07-21 NOTE — BH Assessment (Signed)
Consulted with AC Berneice Heinrichina Tate and Dr.Akhtar whom is declining patient for detox due to pt's primary SOC being Opiates. [facility] does not provide Opiate detox service. Dr.Akhtar is recommending that pt be referred to [facility] or [facility] for inpatient detox treatment.   Glorious PeachNajah Arlicia Paquette, MS, LCASA Assessment Counselor

## 2014-07-21 NOTE — BH Assessment (Signed)
This Clinical research associatewriter spoke with [facility] at [facility] who reports that they have no female detox beds at this time and recommended that we try back tomorrow.   Glorious PeachNajah Lamyia Cdebaca, MS, LCASA Assessment Counselor

## 2014-07-21 NOTE — ED Provider Notes (Signed)
CSN: [id number]     Arrival date & time 07/21/14  1420 History  This chart was scribed for Geoffery Lyonsouglas Sawyer Kahan, MD by Annye AsaAnna Dorsett, ED Scribe. This patient was seen in room [facility] and the patient's care was started at 3:07 PM.    Chief Complaint  Patient presents with  . Medical Clearance   HPI   HPI Comments: Denise Sandoval is a 18 y.o. female who presents to the Emergency Department seeking detox for opiate and benzo addiction. Patient reports this has been an issue for her for the past year; she takes approx 100mg  a day of oxycontin, illegally obtained. She also takes xanax when available to her. She notes discomfort (tingling legs, diarrhea, headache, "crawling skin") when attempting to stop.  Last use: today.   Patient has never been in treatment before. She is here today with her mother, who also notes patient's problem with addiction; patient explains that she "spends all her money" on drugs.   Patient is prescribed Lexapro but she reports that she does not take it.   Past Medical History  Diagnosis Date  . Supervision of normal first pregnancy in first trimester 09/23/2013  . Kidney infection   . RLQ abdominal pain 05/13/2014  . Postpartum endometritis 05/13/2014   Past Surgical History  Procedure Laterality Date  . No past surgeries     Family History  Problem Relation Age of Onset  . Alcohol abuse Mother   . Alcohol abuse Father   . Hypertension Maternal Grandmother   . Alcohol abuse Maternal Grandfather   . Alcohol abuse Paternal Grandfather    History  Substance Use Topics  . Smoking status: Current Every Day Smoker -- 0.50 packs/day    Types: Cigarettes  . Smokeless tobacco: Never Used  . Alcohol Use: No   OB History    Gravida Para Term Preterm AB TAB SAB Ectopic Multiple Living   1 1 1       1      Review of Systems  A complete 10 system review of systems was obtained and all systems are negative except as noted in the HPI and PMH.   Allergies  Review of  patient's allergies indicates no known allergies.  Home Medications   Prior to Admission medications   Medication Sig Start Date End Date Taking? Authorizing Provider  amoxicillin-clavulanate (AUGMENTIN) 875-125 MG per tablet Take 1 tablet by mouth 2 (two) times daily. Patient not taking: Reported on 07/21/2014 05/13/14   Adline PotterJennifer A Griffin, NP  cephALEXin (KEFLEX) 500 MG capsule Take 1 capsule (500 mg total) by mouth 4 (four) times daily. X 7 days Patient not taking: Reported on 07/21/2014 06/16/14   Marge DuncansKimberly Randall Booker, CNM  escitalopram (LEXAPRO) 10 MG tablet Take 1 tablet (10 mg total) by mouth daily. Patient not taking: Reported on 07/21/2014 06/16/14   Marge DuncansKimberly Randall Booker, CNM  HYDROcodone-acetaminophen (NORCO/VICODIN) 5-325 MG per tablet Take 1 tablet by mouth every 4 (four) hours as needed. Patient not taking: Reported on 07/21/2014 05/13/14   Adline PotterJennifer A Griffin, NP   BP 133/81 mmHg  Pulse 92  Temp(Src) 99 F (37.2 C) (Oral)  Resp 18  Ht 5\' 8"  (1.727 m)  Wt 140 lb (63.504 kg)  BMI 21.29 kg/m2  SpO2 99%  LMP 07/07/2014  Breastfeeding? No Physical Exam  Constitutional: She is oriented to person, place, and time. She appears well-developed and well-nourished.  HENT:  Head: Normocephalic and atraumatic.  Eyes: EOM are normal. Pupils are equal, round, and reactive to  light.  Neck: Normal range of motion. Neck supple. No tracheal deviation present.  Cardiovascular: Normal rate, regular rhythm and normal heart sounds.   No murmur heard. Pulmonary/Chest: Effort normal and breath sounds normal.  Neurological: She is alert and oriented to person, place, and time. Coordination normal.  Skin: Skin is warm and dry.  Psychiatric: She has a normal mood and affect. Her behavior is normal.  Nursing note and vitals reviewed.   ED Course  Procedures   DIAGNOSTIC STUDIES: Oxygen Saturation is 99% on RA, normal by my interpretation.    COORDINATION OF CARE: 3:12 PM Discussed  treatment plan with pt at bedside and pt agreed to plan.   Labs Review Labs Reviewed  CBC  ACETAMINOPHEN LEVEL  COMPREHENSIVE METABOLIC PANEL  ETHANOL  SALICYLATE LEVEL  URINE RAPID DRUG SCREEN (HOSP PERFORMED)    Imaging Review No results found.   EKG Interpretation None      MDM   Final diagnoses:  None   Patient presents requesting help with with drug addiction. She is a daily user of narcotics and benzodiazepines. She was evaluated by [facility]TS and a bed search is in progress. She appears to be medically cleared. Care will be signed out to the oncoming provider at shift change.   I personally performed the services described in this documentation, which was scribed in my presence. The recorded information has been reviewed and is accurate.      Geoffery Lyonsouglas Lashaunda Schild, MD 07/21/14 2227

## 2014-07-21 NOTE — BH Assessment (Signed)
Denise SledgeSandra Sandoval from [facility], pt can come from home, labs and her discharge papers. If she is on any medication she needs a week supply of those. She has a bed reserved for her a 9 am. She needs to call [facility] if she decides not to come so they can release the bed.     [facility] Address: [address], [place], [place] [address]  [phone]  Informed Dr. Idell Picklesapos and [name] RN of acceptance to [facility] and instructions provided by Denise SledgeSandra Sandoval.   Clista BernhardtNancy Calhoun Reichardt, [facility] Triage Specialist 07/21/2014 11:54 PM

## 2014-07-21 NOTE — BH Assessment (Addendum)
Tele Assessment Note   Denise Sandoval is an 18 y.o. female. Pt presents to [facility] with C/O medical clearance and requesting inpatient detox services for her Opiate Addiction. Pt reports secondary Benzodiazepine and Cocaine Use. Pt reports increased Opiate use over the past 3 months. Pt reports that she feels like she maybe experiencing Post Partum Depression as she has a 173 months old baby. Pt reports that she discontinued her prescribed Lexapro and unknown time ago because she was high on Opiates and felt that her medication was not working. Pt reports daily Opiate and Benzo use. Pt reports that she uses Cocaine when she can't access any other substances. Pt reports withdraw symptoms to include feeling like her skin is crawling and needles are poking her skin. Pt denies SI, HI, and no AVH reported.  Consulted with AC Berneice Heinrichina Tate and Dr.Akhtar whom is declining patient for detox due to pt's primary SOC being Opiates. [facility] does not provide Opiate detox service. Dr.Akhtar is recommending that pt be referred to [facility] or [facility] for inpatient detox treatment.  Pt clinicals faxed to [facility] and [facility] has no female beds tonight. Notified EDP Dr.Delo and pt ED nurse [name] of recommendation for inpatient treatment.  Axis I: Opoioid Use Disorder, Benzodiazepine Abuse Axis II: Deferred Axis III:  Past Medical History  Diagnosis Date  . Supervision of normal first pregnancy in first trimester 09/23/2013  . Kidney infection   . RLQ abdominal pain 05/13/2014  . Postpartum endometritis 05/13/2014   Axis IV: economic problems, other psychosocial or environmental problems and problems related to social environment Axis V: 41-50 serious symptoms  Past Medical History:  Past Medical History  Diagnosis Date  . Supervision of normal first pregnancy in first trimester 09/23/2013  . Kidney infection   . RLQ abdominal pain 05/13/2014  . Postpartum endometritis 05/13/2014    Past Surgical History  Procedure Laterality Date  . No  past surgeries      Family History:  Family History  Problem Relation Age of Onset  . Alcohol abuse Mother   . Alcohol abuse Father   . Hypertension Maternal Grandmother   . Alcohol abuse Maternal Grandfather   . Alcohol abuse Paternal Grandfather     Social History:  reports that she has been smoking Cigarettes.  She has been smoking about 0.50 packs per day. She has never used smokeless tobacco. She reports that she uses illicit drugs (Marijuana and Cocaine). She reports that she does not drink alcohol.  Additional Social History:     CIWA: CIWA-Ar BP: 133/81 mmHg Pulse Rate: 92 COWS:    PATIENT STRENGTHS: (choose at least two) Ability for insight Average or above average intelligence Motivation for treatment/growth  Allergies: No Known Allergies  Home Medications:  (Not in a hospital admission)  OB/GYN Status:  Patient's last menstrual period was 07/07/2014.  General Assessment Data Location of Assessment: [facility] Is this a Tele or Face-to-Face Assessment?: Tele Assessment Is this an Initial Assessment or a Re-assessment for this encounter?: Initial Assessment Living Arrangements: Other relatives, Non-relatives/Friends (lives  with bf and 213 month old daughter.) Can pt return to current living arrangement?: Yes Admission Status: Voluntary Is patient capable of signing voluntary admission?: Yes Transfer from: Home [facility] Living Arrangements: Other relatives, Non-relatives/Friends (lives  with bf and 773 month old daughter.) Name of Psychiatrist: No Current Provider Name of Therapist: No Current Provider     Risk to self with the  past 6 months Suicidal Ideation: No Suicidal Intent: No Is patient at risk for suicide?: No Suicidal Plan?: No Access to Means: No What has been your use of drugs/alcohol within the last 12 months?:  (Opiates, Oxycontin, Xanax, and Roxycodone) Previous Attempts/Gestures: Yes (1  prior attempt at age 18 o/d on ibuprofen or Tylenol) How many times?: 1 Other Self Harm Risks: hx of cutting as a teenager Triggers for Past Attempts: Other personal contacts, Unpredictable Intentional Self Injurious Behavior: Cutting Comment - Self Injurious Behavior: hx of cutting  Family Suicide History: No Recent stressful life event(s): Other (Comment) (post-partum depression-prescribed Lexapro-non compliant) Persecutory voices/beliefs?: No Depression: Yes Depression Symptoms: Insomnia, Loss of interest in usual pleasures, Feeling worthless/self pity, Feeling angry/irritable Substance abuse history and/or treatment for substance abuse?: Yes Suicide prevention information given to non-admitted patients: Yes  Risk to Others within the past 6 months Homicidal Ideation: No Thoughts of Harm to Others: No Current Homicidal Intent: No Current Homicidal Plan: No Access to Homicidal Means: No Identified Victim: na History of harm to others?: No Assessment of Violence: None Noted Violent Behavior Description: None Noted Does patient have access to weapons?: No Criminal Charges Pending?: No Does patient have a court date: No  Psychosis Hallucinations: None noted Delusions: None noted  Mental Status Report Appear/Hygiene: In scrubs Eye Contact: Fair Motor Activity: Freedom of movement Speech: Logical/coherent Level of Consciousness: Alert Mood: Depressed, Anxious Affect: Anxious, Appropriate to circumstance, Depressed Anxiety Level: Minimal Thought Processes: Coherent, Relevant Judgement: Impaired Orientation: Person, Place, Time, Situation Obsessive Compulsive Thoughts/Behaviors: None  Cognitive Functioning Concentration: Normal Memory: Recent Intact, Remote Intact IQ: Average Insight: Fair Impulse Control: Fair Appetite: Poor Weight Loss: -40 (over past 3 months d/t pill abuse) Weight Gain: 0 Sleep: Decreased Total Hours of Sleep: 3 Vegetative Symptoms: Staying in  bed, Not bathing, Decreased grooming  ADLScreening [facility]) Patient's cognitive ability adequate to safely complete daily activities?: Yes Patient able to express need for assistance with ADLs?: Yes Independently performs ADLs?: Yes (appropriate for developmental age)  Prior Inpatient Therapy Prior Inpatient Therapy: No Prior Therapy Dates: na Prior Therapy Facilty/Provider(s): na Reason for Treatment: na  Prior Outpatient Therapy Prior Outpatient Therapy: No Prior Therapy Dates: na Prior Therapy Facilty/Provider(s): na Reason for Treatment: na  ADL Screening (condition at time of admission) Patient's cognitive ability adequate to safely complete daily activities?: Yes Patient able to express need for assistance with ADLs?: Yes Independently performs ADLs?: Yes (appropriate for developmental age)             Advance Directives (For Healthcare) Does patient have an advance directive?: No Would patient like information on creating an advanced directive?: No - patient declined information    Additional Information 1:1 In Past 12 Months?: No CIRT Risk: No Elopement Risk: No Does patient have medical clearance?: Yes     Disposition:  Disposition Initial Assessment Completed for this Encounter: Yes Disposition of Patient: Other dispositions (pending)   Denise Sandoval, Len BlalockNajah Sabreen Rune Mendez, MS, LCASA Assessment Counselor  07/21/2014 5:15 PM

## 2014-07-21 NOTE — ED Notes (Signed)
Pt have TTS consult now.

## 2014-07-21 NOTE — ED Provider Notes (Signed)
11:59 PM Pt has been accepted to [facility]. Will be ready to go in the morning, they will be expecting her at 9am.   Denise CoKevin M Makelle Marrone, MD 07/22/14 0000
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
## 2014-11-15 ENCOUNTER — Emergency Department (HOSPITAL_COMMUNITY): Payer: No Typology Code available for payment source

## 2014-11-15 ENCOUNTER — Encounter (HOSPITAL_COMMUNITY): Payer: Self-pay | Admitting: Emergency Medicine

## 2014-11-15 ENCOUNTER — Emergency Department (HOSPITAL_COMMUNITY)
Admission: EM | Admit: 2014-11-15 | Discharge: 2014-11-15 | Disposition: A | Discharge status: Home / Self Care | Payer: No Typology Code available for payment source | Attending: Emergency Medicine | Admitting: Emergency Medicine

## 2014-11-15 DIAGNOSIS — S161XXA Strain of muscle, fascia and tendon at neck level, initial encounter: Secondary | ICD-10-CM | POA: Insufficient documentation

## 2014-11-15 DIAGNOSIS — S0990XA Unspecified injury of head, initial encounter: Secondary | ICD-10-CM | POA: Diagnosis present

## 2014-11-15 DIAGNOSIS — S199XXA Unspecified injury of neck, initial encounter: Secondary | ICD-10-CM | POA: Diagnosis not present

## 2014-11-15 DIAGNOSIS — Y9389 Activity, other specified: Secondary | ICD-10-CM | POA: Diagnosis not present

## 2014-11-15 DIAGNOSIS — Z79899 Other long term (current) drug therapy: Secondary | ICD-10-CM | POA: Diagnosis not present

## 2014-11-15 DIAGNOSIS — S29001A Unspecified injury of muscle and tendon of front wall of thorax, initial encounter: Secondary | ICD-10-CM | POA: Diagnosis not present

## 2014-11-15 DIAGNOSIS — Z87448 Personal history of other diseases of urinary system: Secondary | ICD-10-CM | POA: Diagnosis not present

## 2014-11-15 DIAGNOSIS — Y998 Other external cause status: Secondary | ICD-10-CM | POA: Insufficient documentation

## 2014-11-15 DIAGNOSIS — Y92410 Unspecified street and highway as the place of occurrence of the external cause: Secondary | ICD-10-CM | POA: Insufficient documentation

## 2014-11-15 DIAGNOSIS — Z792 Long term (current) use of antibiotics: Secondary | ICD-10-CM | POA: Insufficient documentation

## 2014-11-15 DIAGNOSIS — S8992XA Unspecified injury of left lower leg, initial encounter: Secondary | ICD-10-CM | POA: Insufficient documentation

## 2014-11-15 MED ORDER — HYDROCODONE-ACETAMINOPHEN 5-325 MG PO TABS
1.0000 | ORAL_TABLET | Freq: Once | ORAL | Status: AC
  Administered 2014-11-15: 1 via ORAL
  Filled 2014-11-15: qty 1

## 2014-11-15 MED ORDER — NAPROXEN 500 MG PO TABS: 500.0000 mg | ORAL_TABLET | Freq: Two times a day (BID) | ORAL | Status: DC

## 2014-11-15 MED ORDER — HYDROCODONE-ACETAMINOPHEN 5-325 MG PO TABS: 1.0000 | ORAL_TABLET | Freq: Four times a day (QID) | ORAL | Status: DC | PRN
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
## 2014-11-20 ENCOUNTER — Encounter: Payer: Self-pay | Admitting: Physician Assistant

## 2014-11-20 ENCOUNTER — Ambulatory Visit (INDEPENDENT_AMBULATORY_CARE_PROVIDER_SITE_OTHER): Payer: Medicaid Other | Admitting: Physician Assistant

## 2014-11-20 ENCOUNTER — Ambulatory Visit (INDEPENDENT_AMBULATORY_CARE_PROVIDER_SITE_OTHER): Payer: Medicaid Other

## 2014-11-20 VITALS — BP 120/79 | HR 103 | Temp 99.0°F | Ht 68.0 in | Wt 148.0 lb

## 2014-11-20 DIAGNOSIS — K5909 Other constipation: Secondary | ICD-10-CM

## 2014-11-20 DIAGNOSIS — R109 Unspecified abdominal pain: Secondary | ICD-10-CM | POA: Diagnosis not present

## 2014-11-20 DIAGNOSIS — N760 Acute vaginitis: Secondary | ICD-10-CM

## 2014-11-20 DIAGNOSIS — N3090 Cystitis, unspecified without hematuria: Secondary | ICD-10-CM

## 2014-11-20 DIAGNOSIS — B9689 Other specified bacterial agents as the cause of diseases classified elsewhere: Secondary | ICD-10-CM

## 2014-11-20 DIAGNOSIS — A499 Bacterial infection, unspecified: Secondary | ICD-10-CM

## 2014-11-20 LAB — POCT URINALYSIS DIPSTICK
BILIRUBIN UA: NEGATIVE
Blood, UA: NEGATIVE
GLUCOSE UA: NEGATIVE
Ketones, UA: NEGATIVE
NITRITE UA: NEGATIVE
Protein, UA: NEGATIVE
Spec Grav, UA: <=1.005
Urobilinogen, UA: NEGATIVE
pH, UA: 6.5

## 2014-11-20 LAB — POCT URINE PREGNANCY: PREG TEST UR: NEGATIVE

## 2014-11-20 LAB — POCT UA - MICROSCOPIC ONLY
Casts, Ur, LPF, POC: NEGATIVE
Crystals, Ur, HPF, POC: NEGATIVE
Mucus, UA: NEGATIVE
RBC, urine, microscopic: NEGATIVE
YEAST UA: NEGATIVE

## 2014-11-20 MED ORDER — METRONIDAZOLE 500 MG PO TABS: 500.0000 mg | ORAL_TABLET | Freq: Two times a day (BID) | ORAL | Status: DC

## 2014-11-20 MED ORDER — POLYETHYLENE GLYCOL 3350 17 GM/SCOOP PO POWD
ORAL | Status: DC
Start: 2014-11-20 — End: 2015-01-16

## 2014-11-20 NOTE — Progress Notes (Signed)
   Subjective:    Patient ID: Denise CrazierKaitlyn Sandoval, female    DOB: 06/24/1996, 19 y.o.   MRN: [id number]  HPI 19 y/o female presents c/o right flank pain x 4 days. Went to ER at [facility] 2 days ago. Was treated for UTI and nephrolithiasis. Was given Cipro 500mg  BID x 7 days and Percocet for pain. Percocet was prescribed for every 8 hours but she has been taking it every 4 hours instead. Is [facility] to r/o nephrolithiasis. Denies any new sexual partners.     Review of Systems  Constitutional: Positive for chills and diaphoresis.  Gastrointestinal: Positive for nausea.  Genitourinary: Positive for frequency, flank pain (right ) and decreased urine volume. Negative for dysuria and difficulty urinating.       Objective:   Physical Exam  Abdominal: Soft. Bowel sounds are normal. She exhibits no distension and no mass. There is tenderness (RUQ, RLQ). There is CVA tenderness (right ). There is no rebound and no guarding.  Nursing note and vitals reviewed.         Assessment & Plan:  1. Flank pain  - POCT UA - Microscopic Only - POCT urinalysis dipstick - DG Abd 1 View; Future - POCT urine pregnancy - Urine culture - polyethylene glycol powder (GLYCOLAX/MIRALAX) powder; One capful daily  Dispense: 500 g; Refill: 1  2. Right flank pain  - DG Abd 1 View; Future - Urine culture - polyethylene glycol powder (GLYCOLAX/MIRALAX) powder; One capful daily  Dispense: 500 g; Refill: 1  3. BV (bacterial vaginosis)  - metroNIDAZOLE (FLAGYL) 500 MG tablet; Take 1 tablet (500 mg total) by mouth 2 (two) times daily. Do not drink alcohol while taking  Dispense: 14 tablet; Refill: 0  4. Other constipation  - polyethylene glycol powder (GLYCOLAX/MIRALAX) powder; One capful daily  Dispense: 500 g; Refill: 1

## 2014-11-20 NOTE — Patient Instructions (Signed)
Bacterial Vaginosis Bacterial vaginosis is an infection of the vagina. It happens when too many of certain germs (bacteria) grow in the vagina. HOME CARE  Take your medicine as told by your doctor.  Finish your medicine even if you start to feel better.  Do not have sex until you finish your medicine and are better.  Tell your sex partner that you have an infection. They should see their doctor for treatment.  Practice safe sex. Use condoms. Have only one sex partner. GET HELP IF:  You are not getting better after 3 days of treatment.  You have more grey fluid (discharge) coming from your vagina than before.  You have more pain than before.  You have a fever. MAKE SURE YOU:   Understand these instructions.  Will watch your condition.  Will get help right away if you are not doing well or get worse. Document Released: 05/16/2008 Document Revised: 05/28/2013 Document Reviewed: 03/19/2013 [facility] Patient Information 2015 [place]. This information is not intended to replace advice given to you by your health care provider. Make sure you discuss any questions you have with your health care provider. Constipation Constipation is when a person:  Poops (has a bowel movement) less than 3 times a week.  Has a hard time pooping.  Has poop that is dry, hard, or bigger than normal. HOME CARE   Eat foods with a lot of fiber in them. This includes fruits, vegetables, beans, and whole grains such as brown rice.  Avoid fatty foods and foods with a lot of sugar. This includes french fries, hamburgers, cookies, candy, and soda.  If you are not getting enough fiber from food, take products with added fiber in them (supplements).  Drink enough fluid to keep your pee (urine) clear or pale yellow.  Exercise on a regular basis, or as told by your doctor.  Go to the restroom when you feel like you need to poop. Do not hold it.  Only take medicine as told by your doctor. Do not  take medicines that help you poop (laxatives) without talking to your doctor first. GET HELP RIGHT AWAY IF:   You have bright red blood in your poop (stool).  Your constipation lasts more than 4 days or gets worse.  You have belly (abdominal) or butt (rectal) pain.  You have thin poop (as thin as a pencil).  You lose weight, and it cannot be explained. MAKE SURE YOU:   Understand these instructions.  Will watch your condition.  Will get help right away if you are not doing well or get worse. Document Released: 01/24/2008 Document Revised: 08/12/2013 Document Reviewed: 05/19/2013 [facility] Patient Information 2015 [place], [place]. This information is not intended to replace advice given to you by your health care provider. Make sure you discuss any questions you have with your health care provider.

## 2014-11-22 LAB — URINE CULTURE

## 2014-11-26 ENCOUNTER — Telehealth: Payer: Self-pay | Admitting: *Deleted

## 2014-11-26 MED ORDER — NITROFURANTOIN MONOHYD MACRO 100 MG PO CAPS: 100.0000 mg | ORAL_CAPSULE | Freq: Two times a day (BID) | ORAL | Status: DC

## 2014-11-26 NOTE — Telephone Encounter (Signed)
-----   Message from Chelsea Primusiffany A Gann, PA-C sent at 11/26/2014 10:27 AM EDT ----- Patient was given Cipro from Urgent Care. Enterococcus is resistant. Call patient and change antibiotic to Macrobid 100mg  BID x 10 days. F/U in 2 weeks for recheck

## 2014-11-26 NOTE — Telephone Encounter (Signed)
Pt notified of results RX for Macrobid sent into pharmacy

## 2014-12-15 ENCOUNTER — Emergency Department
Admit: 2014-12-15 | Disposition: A | Discharge status: Home / Self Care | Payer: Self-pay | Admitting: Emergency Medicine

## 2014-12-15 LAB — DRUG SCREEN, URINE
Amphetamines, Ur Screen: NEGATIVE
Barbiturates, Ur Screen: NEGATIVE
Benzodiazepine, Ur Scrn: NEGATIVE
Cannabinoid 50 Ng, Ur ~~LOC~~: POSITIVE
Cocaine Metabolite,Ur ~~LOC~~: NEGATIVE
MDMA (Ecstasy)Ur Screen: NEGATIVE
Methadone, Ur Screen: NEGATIVE
Opiate, Ur Screen: POSITIVE
Phencyclidine (PCP) Ur S: NEGATIVE
Tricyclic, Ur Screen: NEGATIVE

## 2014-12-15 LAB — COMPREHENSIVE METABOLIC PANEL
ALBUMIN: 4.5 g/dL
AST: 18 U/L
Alkaline Phosphatase: 60 U/L
Anion Gap: 7 (ref 7–16)
BUN: 6 mg/dL
Bilirubin,Total: 0.2 mg/dL — ABNORMAL LOW
Calcium, Total: 9.1 mg/dL
Chloride: 104 mmol/L
Co2: 28 mmol/L
Creatinine: 0.74 mg/dL
EGFR (Non-African Amer.): >60
GLUCOSE: 85 mg/dL
Potassium: 3.6 mmol/L
SGPT (ALT): 13 U/L — ABNORMAL LOW
Sodium: 139 mmol/L
Total Protein: 7.7 g/dL

## 2014-12-15 LAB — URINALYSIS, COMPLETE
Bilirubin,UR: NEGATIVE
Blood: NEGATIVE
Glucose,UR: NEGATIVE mg/dL (ref 0–75)
Ketone: NEGATIVE
Leukocyte Esterase: NEGATIVE
Nitrite: NEGATIVE
Ph: 9 (ref 4.5–8.0)
Protein: NEGATIVE
Specific Gravity: 1.011 (ref 1.003–1.030)

## 2014-12-15 LAB — CBC
HCT: 37.8 % (ref 35.0–47.0)
HGB: 12.2 g/dL (ref 12.0–16.0)
MCH: 28.8 pg (ref 26.0–34.0)
MCHC: 32.2 g/dL (ref 32.0–36.0)
MCV: 89 fL (ref 80–100)
PLATELETS: 296 10*3/uL (ref 150–440)
RBC: 4.24 10*6/uL (ref 3.80–5.20)
RDW: 13.7 % (ref 11.5–14.5)
WBC: 8 10*3/uL (ref 3.6–11.0)

## 2014-12-15 LAB — ACETAMINOPHEN LEVEL: Acetaminophen: <10 ug/mL

## 2014-12-15 LAB — ETHANOL: Ethanol: <5 mg/dL

## 2014-12-15 LAB — SALICYLATE LEVEL: Salicylates, Serum: <4 mg/dL

## 2015-01-15 ENCOUNTER — Emergency Department (HOSPITAL_COMMUNITY)
Admission: EM | Admit: 2015-01-15 | Discharge: 2015-01-15 | Disposition: A | Discharge status: Other Acute Inpatient Hospital | Payer: MEDICAID | Attending: Emergency Medicine | Admitting: Emergency Medicine

## 2015-01-15 ENCOUNTER — Encounter (HOSPITAL_COMMUNITY): Payer: Self-pay

## 2015-01-15 DIAGNOSIS — Z792 Long term (current) use of antibiotics: Secondary | ICD-10-CM | POA: Diagnosis not present

## 2015-01-15 DIAGNOSIS — Z8742 Personal history of other diseases of the female genital tract: Secondary | ICD-10-CM | POA: Insufficient documentation

## 2015-01-15 DIAGNOSIS — Z3202 Encounter for pregnancy test, result negative: Secondary | ICD-10-CM | POA: Diagnosis not present

## 2015-01-15 DIAGNOSIS — F329 Major depressive disorder, single episode, unspecified: Secondary | ICD-10-CM | POA: Insufficient documentation

## 2015-01-15 DIAGNOSIS — Z720 Tobacco use: Secondary | ICD-10-CM | POA: Diagnosis not present

## 2015-01-15 DIAGNOSIS — Z79899 Other long term (current) drug therapy: Secondary | ICD-10-CM | POA: Diagnosis not present

## 2015-01-15 DIAGNOSIS — F1210 Cannabis abuse, uncomplicated: Secondary | ICD-10-CM | POA: Diagnosis not present

## 2015-01-15 DIAGNOSIS — F32A Depression, unspecified: Secondary | ICD-10-CM

## 2015-01-15 DIAGNOSIS — Z008 Encounter for other general examination: Secondary | ICD-10-CM | POA: Diagnosis present

## 2015-01-15 DIAGNOSIS — R45851 Suicidal ideations: Secondary | ICD-10-CM

## 2015-01-15 HISTORY — DX: Major depressive disorder, single episode, unspecified: F32.9

## 2015-01-15 HISTORY — DX: Depression, unspecified: F32.A

## 2015-01-15 HISTORY — DX: Anxiety disorder, unspecified: F41.9

## 2015-01-15 LAB — ETHANOL: Alcohol, Ethyl (B): <5 mg/dL (ref <5)

## 2015-01-15 LAB — CBC WITH DIFFERENTIAL/PLATELET
BASOS PCT: 1 % (ref 0–1)
Basophils Absolute: 0.1 10*3/uL (ref 0.0–0.1)
Eosinophils Absolute: 0.4 10*3/uL (ref 0.0–0.7)
Eosinophils Relative: 5 % (ref 0–5)
HCT: 37.7 % (ref 36.0–46.0)
HEMOGLOBIN: 12.4 g/dL (ref 12.0–15.0)
LYMPHS ABS: 2.8 10*3/uL (ref 0.7–4.0)
Lymphocytes Relative: 35 % (ref 12–46)
MCH: 29.7 pg (ref 26.0–34.0)
MCHC: 32.9 g/dL (ref 30.0–36.0)
MCV: 90.2 fL (ref 78.0–100.0)
Monocytes Absolute: 0.6 10*3/uL (ref 0.1–1.0)
Monocytes Relative: 8 % (ref 3–12)
Neutro Abs: 4.1 10*3/uL (ref 1.7–7.7)
Neutrophils Relative %: 51 % (ref 43–77)
PLATELETS: 315 10*3/uL (ref 150–400)
RBC: 4.18 MIL/uL (ref 3.87–5.11)
RDW: 13.5 % (ref 11.5–15.5)
WBC: 7.9 10*3/uL (ref 4.0–10.5)

## 2015-01-15 LAB — COMPREHENSIVE METABOLIC PANEL
ALK PHOS: 63 U/L (ref 38–126)
ALT: 12 U/L — ABNORMAL LOW (ref 14–54)
AST: 18 U/L (ref 15–41)
Albumin: 4.4 g/dL (ref 3.5–5.0)
Anion gap: 10 (ref 5–15)
BUN: 10 mg/dL (ref 6–20)
CALCIUM: 8.8 mg/dL — AB (ref 8.9–10.3)
CO2: 27 mmol/L (ref 22–32)
Chloride: 100 mmol/L — ABNORMAL LOW (ref 101–111)
Creatinine, Ser: 0.74 mg/dL (ref 0.44–1.00)
GFR calc Af Amer: >60 mL/min (ref >60)
GLUCOSE: 118 mg/dL — AB (ref 65–99)
Potassium: 3.6 mmol/L (ref 3.5–5.1)
Sodium: 137 mmol/L (ref 135–145)
TOTAL PROTEIN: 7.4 g/dL (ref 6.5–8.1)
Total Bilirubin: 0.2 mg/dL — ABNORMAL LOW (ref 0.3–1.2)

## 2015-01-15 LAB — URINE MICROSCOPIC-ADD ON

## 2015-01-15 LAB — URINALYSIS, ROUTINE W REFLEX MICROSCOPIC
BILIRUBIN URINE: NEGATIVE
GLUCOSE, UA: NEGATIVE mg/dL
Hgb urine dipstick: NEGATIVE
KETONES UR: NEGATIVE mg/dL
NITRITE: NEGATIVE
Protein, ur: NEGATIVE mg/dL
SPECIFIC GRAVITY, URINE: 1.025 (ref 1.005–1.030)
UROBILINOGEN UA: 0.2 mg/dL (ref 0.0–1.0)
pH: 6.5 (ref 5.0–8.0)

## 2015-01-15 LAB — RAPID URINE DRUG SCREEN, HOSP PERFORMED
AMPHETAMINES: NOT DETECTED
BARBITURATES: NOT DETECTED
BENZODIAZEPINES: NOT DETECTED
COCAINE: NOT DETECTED
Opiates: NOT DETECTED
Tetrahydrocannabinol: POSITIVE — AB

## 2015-01-15 LAB — ACETAMINOPHEN LEVEL

## 2015-01-15 LAB — SALICYLATE LEVEL: Salicylate Lvl: <4 mg/dL (ref 2.8–30.0)

## 2015-01-15 LAB — PREGNANCY, URINE: Preg Test, Ur: NEGATIVE

## 2015-01-15 NOTE — ED Provider Notes (Signed)
CSN: [id number]     Arrival date & time 01/15/15  1853 History   First MD Initiated Contact with Patient 01/15/15 1907     No chief complaint on file.    (Consider location/radiation/quality/duration/timing/severity/associated sxs/prior Treatment) HPI..... Patient presents with a chief complaint of depression and suicidal ideation. She takes excessive amounts of Roxicodone and Xanax. She has been admitted to a psychiatric facility 2 times in the past or similar concerns. She is not psychotic. She is requesting help.  Past Medical History  Diagnosis Date  . Supervision of normal first pregnancy in first trimester 09/23/2013  . Kidney infection   . RLQ abdominal pain 05/13/2014  . Postpartum endometritis 05/13/2014  . Depression   . Anxiety    Past Surgical History  Procedure Laterality Date  . No past surgeries     Family History  Problem Relation Age of Onset  . Alcohol abuse Mother   . Alcohol abuse Father   . Hypertension Maternal Grandmother   . Alcohol abuse Maternal Grandfather   . Alcohol abuse Paternal Grandfather    History  Substance Use Topics  . Smoking status: Current Every Day Smoker -- 0.50 packs/day    Types: Cigarettes  . Smokeless tobacco: Never Used  . Alcohol Use: No   OB History    Gravida Para Term Preterm AB TAB SAB Ectopic Multiple Living   1 1 1       1      Review of Systems  All other systems reviewed and are negative.     Allergies  Review of patient's allergies indicates no known allergies.  Home Medications   Prior to Admission medications   Medication Sig Start Date End Date Taking? Authorizing Provider  ciprofloxacin (CIPRO) 500 MG tablet Take 500 mg by mouth 2 (two) times daily.    Historical Provider, MD  metroNIDAZOLE (FLAGYL) 500 MG tablet Take 1 tablet (500 mg total) by mouth 2 (two) times daily. Do not drink alcohol while taking 11/20/14   Tiffany A Gann, PA-C  nitrofurantoin, macrocrystal-monohydrate, (MACROBID) 100 MG capsule  Take 1 capsule (100 mg total) by mouth 2 (two) times daily. 11/26/14   Tiffany A Gann, PA-C  oxyCODONE-acetaminophen (PERCOCET/ROXICET) 5-325 MG per tablet Take 1 tablet by mouth every 8 (eight) hours as needed for severe pain.    Historical Provider, MD  polyethylene glycol powder (GLYCOLAX/MIRALAX) powder One capful daily 11/20/14   Tiffany A Gann, PA-C   BP 133/79 mmHg  Pulse 91  Temp(Src) 98.7 F (37.1 C) (Oral)  Resp 18  Ht 5\' 8"  (1.727 m)  Wt 135 lb (61.236 kg)  BMI 20.53 kg/m2  SpO2 100%  LMP 01/01/2015 (Approximate)  Breastfeeding? No Physical Exam  Constitutional: She is oriented to person, place, and time. She appears well-developed and well-nourished.  HENT:  Head: Normocephalic and atraumatic.  Eyes: Conjunctivae and EOM are normal. Pupils are equal, round, and reactive to light.  Neck: Normal range of motion. Neck supple.  Cardiovascular: Normal rate and regular rhythm.   Pulmonary/Chest: Effort normal and breath sounds normal.  Abdominal: Soft. Bowel sounds are normal.  Musculoskeletal: Normal range of motion.  Neurological: She is alert and oriented to person, place, and time.  Skin: Skin is warm and dry.  Psychiatric:  Flat affect, depressed  Nursing note and vitals reviewed.   ED Course  Procedures (including critical care time) Labs Review Labs Reviewed  URINE RAPID DRUG SCREEN (HOSP PERFORMED) NOT AT [facility] - Abnormal; Notable for the following:  Tetrahydrocannabinol POSITIVE (*)    All other components within normal limits  CBC WITH DIFFERENTIAL/PLATELET  COMPREHENSIVE METABOLIC PANEL  ETHANOL  URINALYSIS, ROUTINE W REFLEX MICROSCOPIC (NOT AT [facility])  PREGNANCY, URINE  ACETAMINOPHEN LEVEL  SALICYLATE LEVEL    Imaging Review No results found.   EKG Interpretation None      MDM   Final diagnoses:  Depression  Suicidal ideation    Patient has a history of opiate and benzodiazepine abuse. She is suicidal. Will obtain behavioral health  consult    Donnetta Hutching, MD 01/15/15 2013

## 2015-01-15 NOTE — ED Notes (Signed)
TTS completed. 

## 2015-01-15 NOTE — BH Assessment (Signed)
Received notification of TTS consult request. Spoke to Dr. Donnetta HutchingBrian Cook who said Pt is abusing opiates and benzodiazepines and reports suicidal ideation. Tele-assessment will be initiated.  Denise RainFord Ellis Patsy BaltimoreWarrick Sandoval, LPC, [facility], [facility] Triage Specialist [phone]

## 2015-01-15 NOTE — ED Notes (Signed)
I had benzos today and opiates yesterday. I have been on them for about 2 years.

## 2015-01-15 NOTE — ED Notes (Signed)
I am trying to come off of drugs and I am feeling suicidal per pt. I am on opiates and benzos per pt.
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
## 2015-01-16 ENCOUNTER — Encounter (HOSPITAL_COMMUNITY): Payer: Self-pay | Admitting: *Deleted

## 2015-01-16 ENCOUNTER — Inpatient Hospital Stay (HOSPITAL_COMMUNITY)
Admission: AD | Admit: 2015-01-16 | Discharge: 2015-01-20 | DRG: 897 | Disposition: A | Discharge status: Home / Self Care | Payer: MEDICAID | Source: Intra-hospital | Attending: Psychiatry | Admitting: Psychiatry

## 2015-01-16 DIAGNOSIS — F411 Generalized anxiety disorder: Secondary | ICD-10-CM | POA: Diagnosis present

## 2015-01-16 DIAGNOSIS — Z8249 Family history of ischemic heart disease and other diseases of the circulatory system: Secondary | ICD-10-CM | POA: Diagnosis not present

## 2015-01-16 DIAGNOSIS — F17210 Nicotine dependence, cigarettes, uncomplicated: Secondary | ICD-10-CM | POA: Diagnosis present

## 2015-01-16 DIAGNOSIS — R45851 Suicidal ideations: Secondary | ICD-10-CM | POA: Diagnosis present

## 2015-01-16 DIAGNOSIS — G4700 Insomnia, unspecified: Secondary | ICD-10-CM | POA: Diagnosis present

## 2015-01-16 DIAGNOSIS — F4310 Post-traumatic stress disorder, unspecified: Secondary | ICD-10-CM | POA: Diagnosis present

## 2015-01-16 DIAGNOSIS — F1220 Cannabis dependence, uncomplicated: Secondary | ICD-10-CM | POA: Diagnosis present

## 2015-01-16 DIAGNOSIS — F1920 Other psychoactive substance dependence, uncomplicated: Secondary | ICD-10-CM | POA: Diagnosis present

## 2015-01-16 DIAGNOSIS — F410 Panic disorder [episodic paroxysmal anxiety] without agoraphobia: Secondary | ICD-10-CM | POA: Diagnosis present

## 2015-01-16 DIAGNOSIS — F1320 Sedative, hypnotic or anxiolytic dependence, uncomplicated: Secondary | ICD-10-CM | POA: Diagnosis present

## 2015-01-16 DIAGNOSIS — F339 Major depressive disorder, recurrent, unspecified: Secondary | ICD-10-CM | POA: Diagnosis present

## 2015-01-16 DIAGNOSIS — F1120 Opioid dependence, uncomplicated: Principal | ICD-10-CM | POA: Diagnosis present

## 2015-01-16 DIAGNOSIS — Z62810 Personal history of physical and sexual abuse in childhood: Secondary | ICD-10-CM | POA: Diagnosis present

## 2015-01-16 DIAGNOSIS — Z811 Family history of alcohol abuse and dependence: Secondary | ICD-10-CM | POA: Diagnosis not present

## 2015-01-16 MED ORDER — CHLORDIAZEPOXIDE HCL 25 MG PO CAPS
25.0000 mg | ORAL_CAPSULE | Freq: Three times a day (TID) | ORAL | Status: AC
  Administered 2015-01-17 (×3): 25 mg via ORAL
  Filled 2015-01-16 (×3): qty 1

## 2015-01-16 MED ORDER — CHLORDIAZEPOXIDE HCL 25 MG PO CAPS
25.0000 mg | ORAL_CAPSULE | ORAL | Status: AC
  Administered 2015-01-18 (×2): 25 mg via ORAL
  Filled 2015-01-16 (×2): qty 1

## 2015-01-16 MED ORDER — HYDROXYZINE HCL 25 MG PO TABS
25.0000 mg | ORAL_TABLET | Freq: Four times a day (QID) | ORAL | Status: AC | PRN
  Administered 2015-01-17 – 2015-01-18 (×2): 25 mg via ORAL
  Filled 2015-01-16 (×3): qty 1

## 2015-01-16 MED ORDER — NICOTINE 21 MG/24HR TD PT24
21.0000 mg | MEDICATED_PATCH | Freq: Every day | TRANSDERMAL | Status: DC
  Administered 2015-01-16 – 2015-01-19 (×4): 21 mg via TRANSDERMAL
  Filled 2015-01-16 (×7): qty 1
  Filled 2015-01-16: qty 14

## 2015-01-16 MED ORDER — DIPHENHYDRAMINE HCL 25 MG PO CAPS
25.0000 mg | ORAL_CAPSULE | Freq: Every evening | ORAL | Status: DC | PRN
  Filled 2015-01-16: qty 1

## 2015-01-16 MED ORDER — SERTRALINE HCL 50 MG PO TABS
50.0000 mg | ORAL_TABLET | Freq: Every day | ORAL | Status: DC
  Administered 2015-01-16 – 2015-01-18 (×3): 50 mg via ORAL
  Filled 2015-01-16 (×5): qty 1

## 2015-01-16 MED ORDER — ZOLPIDEM TARTRATE 10 MG PO TABS
10.0000 mg | ORAL_TABLET | Freq: Every evening | ORAL | Status: DC | PRN
  Administered 2015-01-16 – 2015-01-19 (×4): 10 mg via ORAL
  Filled 2015-01-16 (×5): qty 1

## 2015-01-16 MED ORDER — CHLORDIAZEPOXIDE HCL 25 MG PO CAPS
25.0000 mg | ORAL_CAPSULE | Freq: Every day | ORAL | Status: AC
  Administered 2015-01-19: 25 mg via ORAL
  Filled 2015-01-16: qty 1

## 2015-01-16 MED ORDER — CHLORDIAZEPOXIDE HCL 25 MG PO CAPS: 25.0000 mg | ORAL_CAPSULE | Freq: Four times a day (QID) | ORAL | Status: AC | PRN

## 2015-01-16 MED ORDER — ENSURE ENLIVE PO LIQD
237.0000 mL | Freq: Two times a day (BID) | ORAL | Status: DC
  Administered 2015-01-16: 237 mL via ORAL

## 2015-01-16 MED ORDER — ACETAMINOPHEN 325 MG PO TABS
650.0000 mg | ORAL_TABLET | Freq: Four times a day (QID) | ORAL | Status: DC | PRN
Start: 2015-01-16 — End: 2015-01-20
  Administered 2015-01-18: 650 mg via ORAL
  Filled 2015-01-16: qty 2

## 2015-01-16 MED ORDER — ALUM & MAG HYDROXIDE-SIMETH 200-200-20 MG/5ML PO SUSP: 30.0000 mL | ORAL | Status: DC | PRN

## 2015-01-16 MED ORDER — LOPERAMIDE HCL 2 MG PO CAPS
2.0000 mg | ORAL_CAPSULE | ORAL | Status: AC | PRN
Start: 2015-01-16 — End: 2015-01-19
  Administered 2015-01-16: 4 mg via ORAL
  Filled 2015-01-16: qty 2

## 2015-01-16 MED ORDER — ONDANSETRON 4 MG PO TBDP
4.0000 mg | ORAL_TABLET | Freq: Four times a day (QID) | ORAL | Status: AC | PRN
  Administered 2015-01-16 – 2015-01-18 (×2): 4 mg via ORAL
  Filled 2015-01-16 (×2): qty 1

## 2015-01-16 MED ORDER — CHLORDIAZEPOXIDE HCL 25 MG PO CAPS
25.0000 mg | ORAL_CAPSULE | Freq: Four times a day (QID) | ORAL | Status: AC
  Administered 2015-01-16 (×3): 25 mg via ORAL
  Filled 2015-01-16 (×3): qty 1

## 2015-01-16 MED ORDER — MAGNESIUM HYDROXIDE 400 MG/5ML PO SUSP: 30.0000 mL | Freq: Every day | ORAL | Status: DC | PRN

## 2015-01-16 MED ORDER — GABAPENTIN 300 MG PO CAPS
300.0000 mg | ORAL_CAPSULE | Freq: Three times a day (TID) | ORAL | Status: DC
  Administered 2015-01-16 – 2015-01-17 (×3): 300 mg via ORAL
  Filled 2015-01-16 (×7): qty 1

## 2015-01-16 NOTE — B[facility] Group Notes (Signed)
[facility] Group Notes:  (Nursing/MHT/Case Management/Adjunct)  Date:  01/16/2015  Time:  1315pm  Type of Therapy:  Nurse Education  Participation Level:  Active  Participation Quality:  Appropriate and Attentive  Affect:  Appropriate  Cognitive:  Alert and Appropriate  Insight:  Appropriate and Good  Engagement in Group:  Engaged  Modes of Intervention:  Discussion, Education and Support  Summary of Progress/Problems: Patient attended group, remained engaged, and responded appropriately when prompted.  Laurine BlazerGuthrie, [place]any A 01/16/2015, 2:12 PM

## 2015-01-16 NOTE — B[facility] Group Notes (Signed)
[facility] Group Notes:  (Clinical Social Work)  01/16/2015     10-11AM  Summary of Progress/Problems:   The main focus of today's process group was to learn how to use a decisional balance exercise to move forward in the Stages of Change, which were described and discussed.  Motivational Interviewing and a worksheet were utilized to help patients explore in depth the perceived benefits and costs of unhealthy coping techniques, as well as the  benefits and costs of replacing that with a healthy coping skills.   The patient expressed that her unhealthy coping involves the use of drugs.  She was late to group because of being with physician, but interacted with others and participated fully, demonstrated insight.  With decisional balance exercise, was able to state that her reasons for changing are much stronger than her reasons for continuing to use.  She also stated insightfully that any costs of changing are "just part of life" anyway.  Type of Therapy:  Group Therapy - Process   Participation Level:  Active  Participation Quality:  Attentive, Sharing and Supportive  Affect:  Blunted  Cognitive:  Alert  Insight:  Developing/Improving  Engagement in Therapy:  Engaged  Modes of Intervention:  Education, Motivational Interviewing  Ambrose MantleMareida Grossman-Orr, LCSW 01/16/2015, 12:13 PM

## 2015-01-16 NOTE — Progress Notes (Signed)
D: Patient is alert and oriented. Pt's mood and affect is depressed and anxious. Pt denies HI and AVH. Pt reports passive suicidal thoughts this morning. Pt rates depression 7/10, hopelessness and anxiety 8/10. Pt complains of withdrawal symptoms including "diarrhea, cold and hot, and shaky." Pt reports she abuses "roxicodone" and is requesting medication for her withdrawal symptoms. Pt reports her goal for the day is "getting my symptoms manageable." RN spoke with pt's mother, Selena BattenKim on the phone at [phone] with pt consent, Selena BattenKim expresses concern that her daughter needs to be on medication for withdrawal, Selena BattenKim confirms that pt is using "roxicodone" at home. Pt complains of nausea and diarrhea this evening with relief from PRN medication. A: MD AfghanistanLugo made aware of pt's requests. Active listening by RN. Encouragement/Support provided to pt. Medication education reviewed with pt. PRN medication administered for nausea and diarrhea per providers orders (See MAR). Scheduled medications administered per providers orders (See MAR). 15 minute checks continued per protocol for patient safety.  R: Pt verbally contracts for safety, agrees not to harm self and agrees to come to staff with increased intensity of suicidal thoughts. Patient cooperative and receptive to nursing interventions. Pt remains safe.

## 2015-01-16 NOTE — Progress Notes (Signed)
D.  Pt pleasant on approach, denies complaints at this time.  Positive for evening AA group, interacting appropriately with peers on the unit.  Denies SI/HI/hallucinations at this time.  A.  Support and encouragement offered, medication given as ordered  R.  Pt remains safe on the unit, will continue to monitor.  
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
## 2015-01-16 NOTE — Progress Notes (Signed)
   Pt was pleasant and cooperative during the adm process. When asked the circumstances surrounding her adm pt stated, "been trying to get off drugs and I can't do it. I'm feeling hopeless, like I wanna die. Pt stated, "I got up with the wrong guy". However, pt states she is no longer with him, but that he's the father of her child.  Pt stated she's been abusing drugs (opiates, and benzo's) for apprx 2 yrs.  Until recently pt was living with her grandmother along with her 648 month old baby. Pt states she now lives with her mother who has also been a pt at [facility] several times. Pt has a hx of kidney infections but has finished all antibiotics. Pt's last use of opiates was Thurs the 26th.  Pt still suicidal but contracts for safety. Pt denies HI, A/V

## 2015-01-16 NOTE — Plan of Care (Signed)
Problem: Ineffective individual coping Goal: STG: Patient will remain free from self harm Outcome: Progressing Patient remains free from self harm. 15 minute checks continued per protocol for patient safety.   Problem: Diagnosis: Increased Risk For Suicide Attempt Goal: STG-Patient Will Attend All Groups On The Unit Outcome: Progressing Patient is attending unit groups today. Goal: STG-Patient Will Comply With Medication Regime Outcome: Progressing Patient has adhered to medication today with ease.  Problem: Alteration in mood & ability to function due to Goal: LTG-Pt reports reduction in suicidal thoughts (Patient reports reduction in suicidal thoughts and is able to verbalize a safety plan for whenever patient is feeling suicidal)  Outcome: Not Progressing Patient continues to endorse having suicidal thoughts today. Pt verbally contracts for safety.
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
## 2015-01-17 LAB — GLUCOSE, CAPILLARY
Glucose-Capillary: 73 mg/dL (ref 65–99)
Glucose-Capillary: 148 mg/dL — ABNORMAL HIGH (ref 65–99)

## 2015-01-17 MED ORDER — GABAPENTIN 300 MG PO CAPS
600.0000 mg | ORAL_CAPSULE | Freq: Three times a day (TID) | ORAL | Status: DC
  Administered 2015-01-17 – 2015-01-18 (×3): 600 mg via ORAL
  Filled 2015-01-17 (×6): qty 2

## 2015-01-17 NOTE — Progress Notes (Signed)
[facility]  01/17/2015 7:33 PM Denise CrazierKaitlyn Sandoval  MRN:  [id number] Subjective:  Denise AlanisKaitlyn continues to be detox. She is also endorsing anxiety, mood instability. She would like to increase the Neurontin further. States she was taking the Neurontin 800 mg TID. She is still endorses wanting to go to a residential treatment program. [facility]. States she is really serious about her recovery. She wants to be able to have her child back Principal Problem: Polysubstance dependence including opioid drug with daily use Diagnosis:   Patient Active Problem List   Diagnosis Date Noted  . Major depressive disorder, recurrent [F33.9] 01/16/2015  . Polysubstance dependence including opioid drug with daily use [F19.20] 01/16/2015  . Post traumatic stress disorder (PTSD) [F43.10] 01/16/2015  . Postpartum depression [F53] 06/16/2014  . RLQ abdominal pain [R10.31] 05/13/2014  . Postpartum endometritis [O86.12] 05/13/2014  . Active labor at term [O80] 05/06/2014  . Chlamydia infection during pregnancy, antepartum [O98.319, A74.9] 04/22/2014  . Pyelonephritis affecting pregnancy in third trimester, antepartum [O23.03, N12] 02/28/2014  . Marijuana use [F12.10] 01/14/2014  . Rubella non-immune status, antepartum [O99.89, Z28.3] 01/14/2014  . Rh negative state in antepartum period [O36.0190] 01/14/2014  . TSS (toxic shock syndrome) [A48.3] 03/28/2012  . ARF (acute renal failure) [N17.9] 03/26/2012  . Fever of unknown origin [R50.9] 03/24/2012  . Tachycardia [R00.0] 03/24/2012  . Leukocytosis [D72.829] 03/24/2012  . Hypokalemia [E87.6] 03/24/2012  . Abnormal LFTs [R79.89] 03/24/2012   Total Time spent with patient: 30 minutes   Past Medical History:  Past Medical History  Diagnosis Date  . Supervision of normal first pregnancy in first trimester 09/23/2013  . Kidney infection   . RLQ abdominal pain 05/13/2014  . Postpartum endometritis 05/13/2014  . Depression   .  Anxiety     Past Surgical History  Procedure Laterality Date  . No past surgeries     Family History:  Family History  Problem Relation Age of Onset  . Alcohol abuse Mother   . Alcohol abuse Father   . Hypertension Maternal Grandmother   . Alcohol abuse Maternal Grandfather   . Alcohol abuse Paternal Grandfather    Social History:  History  Alcohol Use No     History  Drug Use  . 1.00 per week  . Special: Marijuana, Cocaine, Benzodiazepines, Barbituates    Comment: benzos, barbituates    History   Social History  . Marital Status: Single    Spouse Name: N/A  . Number of Children: N/A  . Years of Education: N/A   Social History Main Topics  . Smoking status: Current Every Day Smoker -- 0.50 packs/day for 3 years    Types: Cigarettes  . Smokeless tobacco: Never Used  . Alcohol Use: No  . Drug Use: 1.00 per week    Special: Marijuana, Cocaine, Benzodiazepines, Barbituates     Comment: benzos, barbituates  . Sexual Activity: Yes    Birth Control/ Protection: None   Other Topics Concern  . None   Social History Narrative   Additional History:    Sleep: Fair  Appetite:  Fair   Assessment:   Musculoskeletal: Strength & Muscle Tone: within normal limits Gait & Station: normal Patient leans: N/A   Psychiatric Specialty Exam: Physical Exam  Review of Systems  Constitutional: Positive for malaise/fatigue.  HENT: Negative.   Eyes: Negative.   Respiratory: Negative.   Cardiovascular: Negative.   Gastrointestinal: Negative.   Genitourinary: Negative.   Musculoskeletal: Negative.  Skin: Negative.   Neurological: Negative.   Endo/Heme/Allergies: Negative.   Psychiatric/Behavioral: Positive for depression and substance abuse. The patient is nervous/anxious and has insomnia.     Blood pressure 97/62, pulse 90, temperature 98.3 F (36.8 C), temperature source Oral, resp. rate 18, height  (1.702 m), weight 57.607 kg (127 lb), last menstrual period  01/01/2015, not currently breastfeeding.Body mass index is 19.89 kg/(m^2).  General Appearance: Fairly Groomed  Patent attorney::  Fair  Speech:  Clear and Coherent  Volume:  Decreased  Mood:  Anxious and worried  Affect:  anxious worried  Thought Process:  Coherent and Goal Directed  Orientation:  Full (Time, Place, and Person)  Thought Content:  symtpoms events worries concerns  Suicidal Thoughts:  No  Homicidal Thoughts:  No  Memory:  Immediate;   Fair Recent;   Fair Remote;   Fair  Judgement:  Fair  Insight:  Present  Psychomotor Activity:  Restlessness  Concentration:  Fair  Recall:  [employer] of Knowledge:Fair  Language: Fair  Akathisia:  No  Handed:  Right  AIMS (if indicated):     Assets:  Desire for Improvement  ADL's:  Intact  Cognition: WNL  Sleep:  Number of Hours: 6     Current Medications: Current Facility-Administered Medications  Medication Dose Route Frequency Provider Last Rate Last Dose  . acetaminophen (TYLENOL) tablet 650 mg  650 mg Oral Q6H PRN Worthy Flank, NP      . alum & mag hydroxide-simeth (MAALOX/MYLANTA) 200-200-20 MG/5ML suspension 30 mL  30 mL Oral Q4H PRN Worthy Flank, NP      . chlordiazePOXIDE (LIBRIUM) capsule 25 mg  25 mg Oral Q6H PRN Rachael Fee, MD      . Melene Muller ON 01/18/2015] chlordiazePOXIDE (LIBRIUM) capsule 25 mg  25 mg Oral BH-qamhs Rachael Fee, MD       Followed by  . [START ON 01/19/2015] chlordiazePOXIDE (LIBRIUM) capsule 25 mg  25 mg Oral Daily Rachael Fee, MD      . diphenhydrAMINE (BENADRYL) capsule 25 mg  25 mg Oral QHS PRN Worthy Flank, NP      . feeding supplement (ENSURE ENLIVE) (ENSURE ENLIVE) liquid 237 mL  237 mL Oral BID BM Rachael Fee, MD   237 mL at 01/16/15 1035  . gabapentin (NEURONTIN) capsule 600 mg  600 mg Oral TID Rachael Fee, MD   600 mg at 01/17/15 1655  . hydrOXYzine (ATARAX/VISTARIL) tablet 25 mg  25 mg Oral Q6H PRN Rachael Fee, MD   25 mg at 01/17/15 [phone]  . loperamide (IMODIUM) capsule  2-4 mg  2-4 mg Oral PRN Rachael Fee, MD   4 mg at 01/16/15 1815  . magnesium hydroxide (MILK OF MAGNESIA) suspension 30 mL  30 mL Oral Daily PRN Worthy Flank, NP      . nicotine (NICODERM CQ - dosed in mg/24 hours) patch 21 mg  21 mg Transdermal Daily Rachael Fee, MD   21 mg at 01/17/15 0835  . ondansetron (ZOFRAN-ODT) disintegrating tablet 4 mg  4 mg Oral Q6H PRN Rachael Fee, MD   4 mg at 01/16/15 1815  . sertraline (ZOLOFT) tablet 50 mg  50 mg Oral Daily Rachael Fee, MD   50 mg at 01/17/15 0835  . zolpidem (AMBIEN) tablet 10 mg  10 mg Oral QHS PRN Rachael Fee, MD   10 mg at 01/16/15 2131    Lab Results:  Results for orders placed or performed during the hospital encounter of 01/16/15 (from the past 48 hour(s))  Glucose, capillary     Status: None   Collection Time: 01/17/15 10:56 AM  Result Value Ref Range   Glucose-Capillary 73 65 - 99 mg/dL  Glucose, capillary     Status: Abnormal   Collection Time: 01/17/15 11:32 AM  Result Value Ref Range   Glucose-Capillary 148 (H) 65 - 99 mg/dL    Physical Findings: AIMS: Facial and Oral Movements Muscles of Facial Expression: None, normal Lips and Perioral Area: None, normal Jaw: None, normal Tongue: None, normal,Extremity Movements Upper (arms, wrists, hands, fingers): None, normal Lower (legs, knees, ankles, toes): None, normal, Trunk Movements Neck, shoulders, hips: None, normal, Overall Severity Severity of abnormal movements (highest score from questions above): None, normal Incapacitation due to abnormal movements: None, normal Patient's awareness of abnormal movements (rate only patient's report): No Awareness, Dental Status Current problems with teeth and/or dentures?: No Does patient usually wear dentures?: No  CIWA:  CIWA-Ar Total: 4 COWS:  COWS Total Score: 7  Treatment Plan Summary: Daily contact with patient to assess and evaluate symptoms and progress in treatment and Medication management  Supportive  approach/coping skills Polysubstance Dependence; continue the detox/work a relapse prevention plan Major Depression; pursue the Zoloft as well as CBT/mindfulness PTSD; help process the trauma and the role it is playing in his life today, optimize response to the Zoloft Mood instability' increase the Neurontin to 600 mg TID with plans to increase to 800 mg TID in the next couple of days Medical Decision Making:  Review of Psycho-Social Stressors (1), Review or order clinical lab tests (1), Review of Medication Regimen & Side Effects (2) and Review of New Medication or Change in Dosage (2)     Digna Countess A 01/17/2015, 7:33 PM

## 2015-01-17 NOTE — Progress Notes (Signed)
Pt attended group this evening. 

## 2015-01-17 NOTE — Progress Notes (Signed)
Patient ID: Denise CrazierKaitlyn Sandoval, female   DOB: [birth year], 19 y.o.   MRN: [id number]  DAR: Pt. Denies SI/HI and A/V Hallucinations. Patient reports sleep was good last night, appetite is fair, energy level is normal, and concentration level is low. Patient rates depression 7/10, hopelessness 8/10, and anxiety 9/10. Patient's pulse was elevated when night shift took vitals. On reassessment patient's pulse had decreased to 100. Patient reports she believes her anxiety is directly related to her withdrawal. Patient reports generalized body aches but reports Librium and Neurontin is helping. Support and encouragement provided to the patient. Scheduled medications administered to patient per physician's orders. Patient is receptive and cooperative. Patient is seen in the milieu and is attending some groups. Q15 minute checks are maintained for safety.

## 2015-01-17 NOTE — Plan of Care (Signed)
Problem: Ineffective individual coping Goal: STG: Patient will remain free from self harm Outcome: Progressing Pt has remained safe on unit, visible in dayroom interacting with milieu and attending groups

## 2015-01-17 NOTE — B[facility] Group Notes (Signed)
[facility] Group Notes:  (Nursing/MHT/Case Management/Adjunct)  Date:  01/17/2015  Time:  11:13 AM  Type of Therapy:  Nurse Education  Participation Level:  Minimal  Participation Quality:  Inattentive  Affect:  Appropriate  Cognitive:  Alert  Insight:  Lacking  Engagement in Group:  Poor  Modes of Intervention:  Discussion and Education  Summary of Progress/Problems: The purpose of this group is to discuss Healthy support systems. Patient attended group however she was passing notes with another patient during group. Patient did not appear focused or attentive.   Deasha Clendenin E 01/17/2015, 11:13 AM

## 2015-01-17 NOTE — B[facility] Group Notes (Signed)
[facility] Group Notes:  (Clinical Social Work)  01/17/2015  10:00-11:00AM  Summary of Progress/Problems:   The main focus of today's process group was to   1)  discuss the importance of adding supports  2)  define health supports versus unhealthy supports  3)  identify the patient's current unhealthy supports and plan how to handle them  4)  Identify the patient's current healthy supports and plan what to add.  An emphasis was placed on using counselor, doctor, therapy groups, 12-step groups, and problem-specific support groups to expand supports.    The patient expressed full comprehension of the concepts presented, and agreed that there is a need to add more supports.  She had to leave prior to the end of group due to low blood sugar.  Type of Therapy:  Process Group with Motivational Interviewing  Participation Level:  Active  Participation Quality:  Attentive, Sharing and Supportive  Affect:  Depressed  Cognitive:  Alert and Appropriate  Insight:  Engaged  Engagement in Therapy:  Engaged  Modes of Intervention:   Education, Support and Processing, Activity  Ambrose MantleMareida Grossman-Orr, LCSW 01/17/2015

## 2015-01-17 NOTE — Progress Notes (Signed)
Patient ID: Denise Sandoval, female   DOB: 01-11-1996, 19 y.o.   MRN: [id number]  Patient came to the nurses station during her social worker group and stated she felt shaky and that her "blood sugar" may be low. Patient said she needed to sit down. Writer took patient into the dayroom as MHT Johnisha retrieved her CBG. Patient's CBG was 73. Writer gave patient a juice, graham crackers, and peanut butter. Patient came to writer shortly after and stated she is feeling better.

## 2015-01-18 MED ORDER — BUSPIRONE HCL 5 MG PO TABS
5.0000 mg | ORAL_TABLET | Freq: Three times a day (TID) | ORAL | Status: DC
  Administered 2015-01-18 – 2015-01-19 (×3): 5 mg via ORAL
  Filled 2015-01-18 (×7): qty 1

## 2015-01-18 MED ORDER — GABAPENTIN 400 MG PO CAPS
800.0000 mg | ORAL_CAPSULE | Freq: Three times a day (TID) | ORAL | Status: DC
  Administered 2015-01-18 – 2015-01-20 (×6): 800 mg via ORAL
  Filled 2015-01-18 (×10): qty 2

## 2015-01-18 MED ORDER — SERTRALINE HCL 50 MG PO TABS
75.0000 mg | ORAL_TABLET | Freq: Every day | ORAL | Status: DC
  Administered 2015-01-19: 75 mg via ORAL
  Filled 2015-01-18 (×2): qty 1

## 2015-01-18 NOTE — Plan of Care (Signed)
Problem: Alteration in mood & ability to function due to Goal: STG-Patient will report withdrawal symptoms Outcome: Progressing Pt reports feeling anxious and having generalized pain. Pt medication administrated. Pt report relief

## 2015-01-18 NOTE — Progress Notes (Signed)
[facility]  01/18/2015 3:59 PM Denise CrazierKaitlyn Sandoval  MRN:  [id number] Subjective:  Denise AlanisKaitlyn endorses that she is not feeling well right now. Endorses feeling very down, hopeless. She can identify that she had a conversation with her grandmother who is taking care of her daughter and she felt guilty and ashamed as her grandmother kept putting her down because she cant take care of her baby. Made her feel like she is a bad  awful mother.  Principal Problem: Polysubstance dependence including opioid drug with daily use Diagnosis:   Patient Active Problem List   Diagnosis Date Noted  . Major depressive disorder, recurrent [F33.9] 01/16/2015  . Polysubstance dependence including opioid drug with daily use [F19.20] 01/16/2015  . Post traumatic stress disorder (PTSD) [F43.10] 01/16/2015  . Postpartum depression [F53] 06/16/2014  . RLQ abdominal pain [R10.31] 05/13/2014  . Postpartum endometritis [O86.12] 05/13/2014  . Active labor at term [O80] 05/06/2014  . Chlamydia infection during pregnancy, antepartum [O98.319, A74.9] 04/22/2014  . Pyelonephritis affecting pregnancy in third trimester, antepartum [O23.03, N12] 02/28/2014  . Marijuana use [F12.10] 01/14/2014  . Rubella non-immune status, antepartum [O99.89, Z28.3] 01/14/2014  . Rh negative state in antepartum period [O36.0190] 01/14/2014  . TSS (toxic shock syndrome) [A48.3] 03/28/2012  . ARF (acute renal failure) [N17.9] 03/26/2012  . Fever of unknown origin [R50.9] 03/24/2012  . Tachycardia [R00.0] 03/24/2012  . Leukocytosis [D72.829] 03/24/2012  . Hypokalemia [E87.6] 03/24/2012  . Abnormal LFTs [R79.89] 03/24/2012   Total Time spent with patient: 30 minutes   Past Medical History:  Past Medical History  Diagnosis Date  . Supervision of normal first pregnancy in first trimester 09/23/2013  . Kidney infection   . RLQ abdominal pain 05/13/2014  . Postpartum endometritis 05/13/2014  . Depression   . Anxiety     Past Surgical  History  Procedure Laterality Date  . No past surgeries     Family History:  Family History  Problem Relation Age of Onset  . Alcohol abuse Mother   . Alcohol abuse Father   . Hypertension Maternal Grandmother   . Alcohol abuse Maternal Grandfather   . Alcohol abuse Paternal Grandfather    Social History:  History  Alcohol Use No     History  Drug Use  . 1.00 per week  . Special: Marijuana, Cocaine, Benzodiazepines, Barbituates    Comment: benzos, barbituates    History   Social History  . Marital Status: Single    Spouse Name: N/A  . Number of Children: N/A  . Years of Education: N/A   Social History Main Topics  . Smoking status: Current Every Day Smoker -- 0.50 packs/day for 3 years    Types: Cigarettes  . Smokeless tobacco: Never Used  . Alcohol Use: No  . Drug Use: 1.00 per week    Special: Marijuana, Cocaine, Benzodiazepines, Barbituates     Comment: benzos, barbituates  . Sexual Activity: Yes    Birth Control/ Protection: None   Other Topics Concern  . None   Social History Narrative   Additional History:    Sleep: Fair  Appetite:  Fair   Assessment:   Musculoskeletal: Strength & Muscle Tone: within normal limits Gait & Station: normal Patient leans: N/A   Psychiatric Specialty Exam: Physical Exam  Review of Systems  Constitutional: Positive for malaise/fatigue.  HENT: Negative.   Eyes: Negative.   Respiratory: Negative.   Cardiovascular: Negative.   Gastrointestinal: Negative.   Genitourinary: Negative.   Musculoskeletal: Negative.   Skin:  Negative.   Neurological: Positive for weakness.  Endo/Heme/Allergies: Negative.   Psychiatric/Behavioral: Positive for depression and substance abuse. The patient is nervous/anxious.     Blood pressure 110/85, pulse 94, temperature 98.7 F (37.1 C), temperature source Oral, resp. rate 16, height  (1.702 m), weight 57.607 kg (127 lb), last menstrual period 01/01/2015, SpO2 100 %, not  currently breastfeeding.Body mass index is 19.89 kg/(m^2).  General Appearance: Fairly Groomed  Patent attorney::  Fair  Speech:  Clear and Coherent  Volume:  Decreased  Mood:  Anxious, Depressed, Dysphoric, Hopeless and Worthless  Affect:  Depressed and anxious worried  Thought Process:  Coherent and Goal Directed  Orientation:  Full (Time, Place, and Person)  Thought Content:  symptoms events worries concerns  Suicidal Thoughts:  No  Homicidal Thoughts:  No  Memory:  Immediate;   Fair Recent;   Fair Remote;   Fair  Judgement:  Fair  Insight:  Present  Psychomotor Activity:  Restlessness  Concentration:  Fair  Recall:  [employer] of Knowledge:Fair  Language: Fair  Akathisia:  No  Handed:  Right  AIMS (if indicated):     Assets:  Desire for Improvement  ADL's:  Intact  Cognition: WNL  Sleep:  Number of Hours: 5.5     Current Medications: Current Facility-Administered Medications  Medication Dose Route Frequency Provider Last Rate Last Dose  . acetaminophen (TYLENOL) tablet 650 mg  650 mg Oral Q6H PRN Worthy Flank, NP      . alum & mag hydroxide-simeth (MAALOX/MYLANTA) 200-200-20 MG/5ML suspension 30 mL  30 mL Oral Q4H PRN Worthy Flank, NP      . busPIRone (BUSPAR) tablet 5 mg  5 mg Oral TID Rachael Fee, MD   5 mg at 01/18/15 1200  . chlordiazePOXIDE (LIBRIUM) capsule 25 mg  25 mg Oral Q6H PRN Rachael Fee, MD      . chlordiazePOXIDE (LIBRIUM) capsule 25 mg  25 mg Oral BH-qamhs Rachael Fee, MD   25 mg at 01/18/15 0751   Followed by  . [START ON 01/19/2015] chlordiazePOXIDE (LIBRIUM) capsule 25 mg  25 mg Oral Daily Rachael Fee, MD      . diphenhydrAMINE (BENADRYL) capsule 25 mg  25 mg Oral QHS PRN Worthy Flank, NP      . feeding supplement (ENSURE ENLIVE) (ENSURE ENLIVE) liquid 237 mL  237 mL Oral BID BM Rachael Fee, MD   237 mL at 01/16/15 1035  . gabapentin (NEURONTIN) capsule 800 mg  800 mg Oral TID Rachael Fee, MD   800 mg at 01/18/15 1159  . hydrOXYzine  (ATARAX/VISTARIL) tablet 25 mg  25 mg Oral Q6H PRN Rachael Fee, MD   25 mg at 01/17/15 [phone]  . loperamide (IMODIUM) capsule 2-4 mg  2-4 mg Oral PRN Rachael Fee, MD   4 mg at 01/16/15 1815  . magnesium hydroxide (MILK OF MAGNESIA) suspension 30 mL  30 mL Oral Daily PRN Worthy Flank, NP      . nicotine (NICODERM CQ - dosed in mg/24 hours) patch 21 mg  21 mg Transdermal Daily Rachael Fee, MD   21 mg at 01/18/15 1478  . ondansetron (ZOFRAN-ODT) disintegrating tablet 4 mg  4 mg Oral Q6H PRN Rachael Fee, MD   4 mg at 01/18/15 1200  . [START ON 01/19/2015] sertraline (ZOLOFT) tablet 75 mg  75 mg Oral Daily Rachael Fee, MD      .  zolpidem (AMBIEN) tablet 10 mg  10 mg Oral QHS PRN Rachael Fee, MD   10 mg at 01/17/15 2124    Lab Results:  Results for orders placed or performed during the hospital encounter of 01/16/15 (from the past 48 hour(s))  Glucose, capillary     Status: None   Collection Time: 01/17/15 10:56 AM  Result Value Ref Range   Glucose-Capillary 73 65 - 99 mg/dL  Glucose, capillary     Status: Abnormal   Collection Time: 01/17/15 11:32 AM  Result Value Ref Range   Glucose-Capillary 148 (H) 65 - 99 mg/dL    Physical Findings: AIMS: Facial and Oral Movements Muscles of Facial Expression: None, normal Lips and Perioral Area: None, normal Jaw: None, normal Tongue: None, normal,Extremity Movements Upper (arms, wrists, hands, fingers): None, normal Lower (legs, knees, ankles, toes): None, normal, Trunk Movements Neck, shoulders, hips: None, normal, Overall Severity Severity of abnormal movements (highest score from questions above): None, normal Incapacitation due to abnormal movements: None, normal Patient's awareness of abnormal movements (rate only patient's report): No Awareness, Dental Status Current problems with teeth and/or dentures?: No Does patient usually wear dentures?: No  CIWA:  CIWA-Ar Total: 3 COWS:  COWS Total Score: 7  Treatment Plan  Summary: Daily contact with patient to assess and evaluate symptoms and progress in treatment and Medication management Supportive approach/coping skills Mood instability; will increase the Neurontin to the 800 mg TID that she used to take before and found effective Anxiety; will start a trial with Buspar 5 mg TID ( has used it before successfully before) Polysubstance abuse; will continue the detox and work a relapse prevention plan Use CBT/mindfulness to help change her response to her grandmother's comments Will work to identify a residential treatment program she can go to  Medical Decision Making:  Review of Psycho-Social Stressors (1), Review of Medication Regimen & Side Effects (2) and Review of New Medication or Change in Dosage (2)     Voncille Simm A 01/18/2015, 3:59 PM

## 2015-01-18 NOTE — Progress Notes (Addendum)
D: Pt presents with a CIWA of (2) for anxiety and tremors. Pt is visible and active within the milieu. Pt denies any SI/HI/AVH.  A: Writer administered Ambien as a sleep aid for pt.  Continued support and availability as needed was extended to this pt. Staff continue to monitor pt with q8615min checks.  R: No adverse drug reactions noted. Pt receptive to treatment. Pt remains safe at this time.

## 2015-01-18 NOTE — Progress Notes (Signed)
Patient ID: Denise Sandoval, female   DOB: 1996/02/24, 19 y.o.   MRN: [id number]  DAR: Pt. Denies SI/HI and A/V Hallucinations during morning assessment however patient's self inventory sheet reports that she does still continue to have SI sometimes. Patient reports she is able to contract for safety. Patient reports depression, anxiety, and hopelessness at 8/10 for the day. She reports that she sleep was fair last night, appetite is fair, energy level is low, and ability to concentrate is poor. Patient continues to report generalized pain related to her withdrawals. Patient reports that she is feeling a little better overall however she is still not at her best. Support and encouragement provided to the patient to continue plan of care. Scheduled medications administered to patient per physician's orders. Patient is receptive and cooperative. Patient is seen in the milieu interacting with peers and is attending groups. Q15 minute checks are maintained for safety.

## 2015-01-18 NOTE — Plan of Care (Signed)
Problem: Alteration in mood & ability to function due to Goal: STG-Patient will report withdrawal symptoms Outcome: Progressing Patient is able to identify and report withdrawal symptoms.

## 2015-01-18 NOTE — [facility] Group Notes (Signed)
[facility]  01/18/2015 8:45 AM  Participation Quality: Alert, Appropriate and Oriented  Mood/Affect: Appropriate   Depression Rating: 8  Anxiety Rating: 8  Thoughts of Suicide: Pt denies SI/HI  Will you contract for safety? Yes  Current AVH: Pt denies  Plan for Discharge/Comments: Pt attended discharge planning group and actively participated in group. CSW provided pt with today's workbook. Pt shared with the group that she feels her high rating of depression and anxiety is related to her life circumstances. She describes her life as chaotic and difficult at the time. Pt reported that prior to admission here, she had [facility] the [facility] Pt also reports that [facility].   Transportation Means: Pt reports access to transportation  Supports: No supports mentioned at this time  Chad CordialLauren Carter, LCSWA 01/18/2015 11:57 AM h

## 2015-01-19 MED ORDER — ZOLPIDEM TARTRATE 10 MG PO TABS: 10.0000 mg | ORAL_TABLET | Freq: Every evening | ORAL | Status: DC | PRN

## 2015-01-19 MED ORDER — GABAPENTIN 400 MG PO CAPS: 800.0000 mg | ORAL_CAPSULE | Freq: Three times a day (TID) | ORAL | Status: DC

## 2015-01-19 MED ORDER — NICOTINE 21 MG/24HR TD PT24: 21.0000 mg | MEDICATED_PATCH | Freq: Every day | TRANSDERMAL | Status: DC

## 2015-01-19 MED ORDER — HYDROXYZINE HCL 25 MG PO TABS: 25.0000 mg | ORAL_TABLET | Freq: Four times a day (QID) | ORAL | Status: DC | PRN

## 2015-01-19 MED ORDER — TUBERCULIN PPD 5 UNIT/0.1ML ID SOLN
5.0000 [IU] | Freq: Once | INTRADERMAL | Status: DC
  Administered 2015-01-19: 5 [IU] via INTRADERMAL

## 2015-01-19 MED ORDER — HYDROXYZINE HCL 25 MG PO TABS
25.0000 mg | ORAL_TABLET | Freq: Four times a day (QID) | ORAL | Status: DC | PRN
  Administered 2015-01-19: 25 mg via ORAL
  Filled 2015-01-19: qty 1
  Filled 2015-01-19: qty 30

## 2015-01-19 MED ORDER — BUSPIRONE HCL 10 MG PO TABS: 10.0000 mg | ORAL_TABLET | Freq: Three times a day (TID) | ORAL | Status: DC

## 2015-01-19 MED ORDER — SERTRALINE HCL 100 MG PO TABS: 100.0000 mg | ORAL_TABLET | Freq: Every day | ORAL | Status: DC

## 2015-01-19 MED ORDER — SERTRALINE HCL 100 MG PO TABS
100.0000 mg | ORAL_TABLET | Freq: Every day | ORAL | Status: DC
  Administered 2015-01-20: 100 mg via ORAL
  Filled 2015-01-19: qty 1
  Filled 2015-01-19: qty 14
  Filled 2015-01-19: qty 1

## 2015-01-19 MED ORDER — BUSPIRONE HCL 10 MG PO TABS
10.0000 mg | ORAL_TABLET | Freq: Three times a day (TID) | ORAL | Status: DC
  Administered 2015-01-19 – 2015-01-20 (×3): 10 mg via ORAL
  Filled 2015-01-19 (×3): qty 1
  Filled 2015-01-19: qty 42
  Filled 2015-01-19 (×3): qty 1
  Filled 2015-01-19 (×2): qty 42
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
## 2015-01-19 NOTE — Progress Notes (Signed)
NUTRITION ASSESSMENT  Pt identified as at risk on the Malnutrition Screen Tool  INTERVENTION: 1. Educated patient on the importance of nutrition and encouraged intake of food and beverages. 2. Discussed weight goals. 3. Supplements: continue Ensure Enlive order  NUTRITION DIAGNOSIS: Unintentional weight loss related to sub-optimal intake as evidenced by pt report.   Goal: Pt to meet >/= 90% of their estimated nutrition needs.  Monitor:  PO intake  Assessment:  Pt seen for MST. She reports that PTA she had a poor appetite x6 months and that she lost 20 lbs during that time. Weight loss hx review indicates 13 lb weight loss (9% body weight) in 6 months which is significant. She states that she ate breakfast this AM and that since admission her appetite has been great. She even states "I will probably gain a lot of weight here because my appetite is back." Pt states she is drinking the Ensure provided.  Expect pt to meet needs during admission.  19 y.o. female  Height: Ht Readings from Last 1 Encounters:  01/16/15 5\' 7"  (1.702 m)    Weight: Wt Readings from Last 1 Encounters:  01/16/15 127 lb (57.607 kg)    Weight Hx: Wt Readings from Last 10 Encounters:  01/16/15 127 lb (57.607 kg)  01/15/15 135 lb (61.236 kg)  11/20/14 148 lb (67.132 kg)  11/15/14 135 lb (61.236 kg)  07/21/14 140 lb (63.504 kg)  06/16/14 154 lb (69.854 kg)  05/19/14 152 lb (68.947 kg)  05/13/14 154 lb 8 oz (70.081 kg)  05/11/14 160 lb (72.576 kg)  05/05/14 176 lb (79.833 kg)    BMI:  Body mass index is 19.89 kg/(m^2). Pt meets criteria for normal weight based on current BMI.  Estimated Nutritional Needs: Kcal: 25-30 kcal/kg Protein: > 1 gram protein/kg Fluid: 1 ml/kcal  Diet Order: Diet regular Room service appropriate?: Yes; Fluid consistency:: Thin Pt is also offered choice of unit snacks mid-morning and mid-afternoon.  Pt is eating as desired.   Lab results and medications reviewed.     Trenton GammonJessica Maribella Kuna, RD, LDN Inpatient Clinical Dietitian Pager # [phone] After hours/weekend pager # [phone]

## 2015-01-19 NOTE — Progress Notes (Signed)
Recreation Therapy Notes  Animal-Assisted Activity (AAA) Program Checklist/Progress Notes Patient Eligibility Criteria Checklist & Daily Group note for Rec Tx Intervention  Date: 05.31.16 Time: 2:30pm [facility] [address]   AAA/T Program Assumption of Risk Form signed by Patient/ or Parent Legal Guardian yes  Patient is free of allergies or sever asthma yes  Patient reports no fear of animals yes  Patient reports no history of cruelty to animalsyes  Patient understands his/her participation is voluntary yes  Patient washes hands before animal contact yes  Patient washes hands after animal contact yes  Behavioral Response: Engaged  Education: Charity fundraiserHand Washing, Appropriate Animal Interaction   Education Outcome: Acknowledges understanding/In group clarification offered/Needs additional education.   Clinical Observations/Feedback: Patient sat on the floor and pet the dog. Patient also asked questions.   Caroll RancherMarjette Anjeli Casad, LRT/CTRS         Caroll RancherLindsay, Cailynn Bodnar A 01/19/2015 4:40 PM

## 2015-01-19 NOTE — Plan of Care (Signed)
Problem: Diagnosis: Increased Risk For Suicide Attempt Goal: STG-Patient Will Report Suicidal Feelings to Staff Outcome: Progressing Patient reports passive SI but is able to contract for safety. Q15 minute safety checks maintained

## 2015-01-19 NOTE — [facility] Group Notes (Signed)
[facility] 01/19/2015 1:15pm  Type of Therapy: Mental Health Association Presentation  Participation Level: Active  Participation Quality: Attentive  Affect: Appropriate  Cognitive: Oriented  Insight: Developing/Improving  Engagement in Therapy: Engaged  Modes of Intervention: Discussion, Education and Socialization  Summary of Progress/Problems: Mental Health Association (MHA) Speaker came to talk about his personal journey with substance abuse and addiction. The pt processed ways by which to relate to the speaker. MHA speaker provided handouts and educational information pertaining to groups and services offered by the [facility]. Pt participated appropriately, was attentive and engaged during discussion. Pt was receptive to resources provided by speaker.     Chad CordialLauren Carter, LCSWA 01/19/2015 1:28 PM

## 2015-01-19 NOTE — Progress Notes (Signed)
Patient ID: Denise Sandoval, female   DOB: 1996-04-11, 19 y.o.   MRN: [id number] D: Patient in room sleeping on approach. Pt reports feeling anxious about discharge tomorrow. Pt reports she knows she need to do this for herself and daughter. Pt reports her daughter is young and will not remember her absence while while she is in treatment. Pt mood and affect appeared anxious. Pt denies SI/HI/AVH and pain. Cooperative with assessment.   A: Met with pt 1:1. Medications administered as prescribed. Support and encouragement provided to discuss feelings.    R: Patient remains safe. She is complaint with medications and denies any adverse reaction. Patient  provided with discharge instructions and verbalized understanding. AVS signed by pt.

## 2015-01-19 NOTE — [facility] Group Notes (Signed)
[facility] Group Notes:  (Nursing/MHT/Case Management/Adjunct)  Date:  01/19/2015  Time:  10:10 AM  Type of Therapy:  Nurse Education  Participation Level:  Minimal  Participation Quality:  Drowsy  Affect:  Anxious  Cognitive:  Appropriate  Insight:  Lacking  Engagement in Group:  Lacking  Modes of Intervention:  Discussion and Education  Summary of Progress/Problems: The topic of discussion today was Recovery. Patient's were encouraged to focus on sleep hygiene, gaining and utilizing coping skills. Patient attended group but appeared lethargic, yawning throughout group. Patient requested for a journal and received one. Patient reported she likes to write poetry. Patient also received a daily workbook. Patient left group early but returned near the end.   Jeraldin Fesler E 01/19/2015, 10:10 AM

## 2015-01-19 NOTE — Progress Notes (Signed)
[facility]  01/19/2015 3:47 PM Denise CrazierKaitlyn Sandoval  MRN:  [id number] Subjective:  Denise AlanisKaitlyn continues to work on her detox/relapse prevention plan. She is still dealing with the anxiety, the depression, the worry but is more hopeful as [facility]. Will like to pursue that plan. She is still upset with the perception of the way her grandmother treats her but states she can understand based on her past behavior and the fact that her grandmother is [id number] taking care of a baby Principal Problem: Polysubstance dependence including opioid drug with daily use Diagnosis:   Patient Active Problem List   Diagnosis Date Noted  . Major depressive disorder, recurrent [F33.9] 01/16/2015  . Polysubstance dependence including opioid drug with daily use [F19.20] 01/16/2015  . Post traumatic stress disorder (PTSD) [F43.10] 01/16/2015  . Postpartum depression [F53] 06/16/2014  . RLQ abdominal pain [R10.31] 05/13/2014  . Postpartum endometritis [O86.12] 05/13/2014  . Active labor at term [O80] 05/06/2014  . Chlamydia infection during pregnancy, antepartum [O98.319, A74.9] 04/22/2014  . Pyelonephritis affecting pregnancy in third trimester, antepartum [O23.03, N12] 02/28/2014  . Marijuana use [F12.10] 01/14/2014  . Rubella non-immune status, antepartum [O99.89, Z28.3] 01/14/2014  . Rh negative state in antepartum period [O36.0190] 01/14/2014  . TSS (toxic shock syndrome) [A48.3] 03/28/2012  . ARF (acute renal failure) [N17.9] 03/26/2012  . Fever of unknown origin [R50.9] 03/24/2012  . Tachycardia [R00.0] 03/24/2012  . Leukocytosis [D72.829] 03/24/2012  . Hypokalemia [E87.6] 03/24/2012  . Abnormal LFTs [R79.89] 03/24/2012   Total Time spent with patient: 30 minutes   Past Medical History:  Past Medical History  Diagnosis Date  . Supervision of normal first pregnancy in first trimester 09/23/2013  . Kidney infection   . RLQ abdominal pain 05/13/2014  . Postpartum  endometritis 05/13/2014  . Depression   . Anxiety     Past Surgical History  Procedure Laterality Date  . No past surgeries     Family History:  Family History  Problem Relation Age of Onset  . Alcohol abuse Mother   . Alcohol abuse Father   . Hypertension Maternal Grandmother   . Alcohol abuse Maternal Grandfather   . Alcohol abuse Paternal Grandfather    Social History:  History  Alcohol Use No     History  Drug Use  . 1.00 per week  . Special: Marijuana, Cocaine, Benzodiazepines, Barbituates    Comment: benzos, barbituates    History   Social History  . Marital Status: Single    Spouse Name: N/A  . Number of Children: N/A  . Years of Education: N/A   Social History Main Topics  . Smoking status: Current Every Day Smoker -- 0.50 packs/day for 3 years    Types: Cigarettes  . Smokeless tobacco: Never Used  . Alcohol Use: No  . Drug Use: 1.00 per week    Special: Marijuana, Cocaine, Benzodiazepines, Barbituates     Comment: benzos, barbituates  . Sexual Activity: Yes    Birth Control/ Protection: None   Other Topics Concern  . None   Social History Narrative   Additional History:    Sleep: Fair  Appetite:  Fair   Assessment:   Musculoskeletal: Strength & Muscle Tone: within normal limits Gait & Station: normal Patient leans: N/A   Psychiatric Specialty Exam: Physical Exam  Review of Systems  Constitutional: Negative.   HENT: Negative.   Eyes: Negative.   Respiratory: Negative.   Cardiovascular: Negative.   Gastrointestinal: Negative.  Genitourinary: Negative.   Musculoskeletal: Negative.   Skin: Negative.   Neurological: Negative.   Endo/Heme/Allergies: Negative.   Psychiatric/Behavioral: Positive for depression and substance abuse. The patient is nervous/anxious.     Blood pressure 107/72, pulse 94, temperature 98.8 F (37.1 C), temperature source Oral, resp. rate 16, height  (1.702 m), weight 57.607 kg (127 lb), last menstrual  period 01/01/2015, SpO2 100 %, not currently breastfeeding.Body mass index is 19.89 kg/(m^2).  General Appearance: Fairly Groomed  Patent attorney::  Fair  Speech:  Clear and Coherent  Volume:  Normal  Mood:  Anxious and worried  Affect:  anxious worried  Thought Process:  Coherent and Goal Directed  Orientation:  Full (Time, Place, and Person)  Thought Content:  plans as she moves on, symptoms worries concerns  Suicidal Thoughts:  No  Homicidal Thoughts:  No  Memory:  Immediate;   Fair Recent;   Fair Remote;   Fair  Judgement:  Fair  Insight:  Present  Psychomotor Activity:  Restlessness  Concentration:  Fair  Recall:  [employer] of Knowledge:Fair  Language: Fair  Akathisia:  No  Handed:  Right  AIMS (if indicated):     Assets:  Desire for Improvement Intimacy Social Support  ADL's:  Intact  Cognition: WNL  Sleep:  Number of Hours: 6     Current Medications: Current Facility-Administered Medications  Medication Dose Route Frequency Provider Last Rate Last Dose  . acetaminophen (TYLENOL) tablet 650 mg  650 mg Oral Q6H PRN Worthy Flank, NP   650 mg at 01/18/15 2017  . alum & mag hydroxide-simeth (MAALOX/MYLANTA) 200-200-20 MG/5ML suspension 30 mL  30 mL Oral Q4H PRN Worthy Flank, NP      . busPIRone (BUSPAR) tablet 10 mg  10 mg Oral TID Rachael Fee, MD   10 mg at 01/19/15 1159  . diphenhydrAMINE (BENADRYL) capsule 25 mg  25 mg Oral QHS PRN Worthy Flank, NP      . feeding supplement (ENSURE ENLIVE) (ENSURE ENLIVE) liquid 237 mL  237 mL Oral BID BM Rachael Fee, MD   237 mL at 01/16/15 1035  . gabapentin (NEURONTIN) capsule 800 mg  800 mg Oral TID Rachael Fee, MD   800 mg at 01/19/15 1159  . magnesium hydroxide (MILK OF MAGNESIA) suspension 30 mL  30 mL Oral Daily PRN Worthy Flank, NP      . nicotine (NICODERM CQ - dosed in mg/24 hours) patch 21 mg  21 mg Transdermal Daily Rachael Fee, MD   21 mg at 01/19/15 0747  . [START ON 01/20/2015] sertraline (ZOLOFT)  tablet 100 mg  100 mg Oral Daily Rachael Fee, MD      . zolpidem [facility]) tablet 10 mg  10 mg Oral QHS PRN Rachael Fee, MD   10 mg at 01/18/15 2143    Lab Results: No results found for this or any previous visit (from the past 48 hour(s)).  Physical Findings: AIMS: Facial and Oral Movements Muscles of Facial Expression: None, normal Lips and Perioral Area: None, normal Jaw: None, normal Tongue: None, normal,Extremity Movements Upper (arms, wrists, hands, fingers): None, normal Lower (legs, knees, ankles, toes): None, normal, Trunk Movements Neck, shoulders, hips: None, normal, Overall Severity Severity of abnormal movements (highest score from questions above): None, normal Incapacitation due to abnormal movements: None, normal Patient's awareness of abnormal movements (rate only patient's report): No Awareness, Dental Status Current problems with teeth and/or dentures?: No Does  patient usually wear dentures?: No  CIWA:  CIWA-Ar Total: 0 COWS:  COWS Total Score: 7  Treatment Plan Summary: Daily contact with patient to assess and evaluate symptoms and progress in treatment and Medication management Supportive approach/coping skills Polysubstance dependence; continue to work a relapse prevention plan Depression; will increase the Zoloft to 100 mg daily  Anxiety; will continue to work with the Buspar at 10 mg TID/Neurontin 800 mg TID Facilitate [facility] house tommorrow   Medical Decision Making:  Review of Psycho-Social Stressors (1), Review of Medication Regimen & Side Effects (2) and Review of New Medication or Change in Dosage (2)     Mariette Cowley A 01/19/2015, 3:47 PM

## 2015-01-19 NOTE — Progress Notes (Signed)
Patient ID: Denise Sandoval, female   DOB: April 03, 1996, 19 y.o.   MRN: [id number]  DAR: Pt. Denies HI and A/V Hallucinations. Patient reports passive SI with no current plan and thoughts are only at times. Patient is able to contract for safety. Patient does not report any pain or discomfort at this time. Support and encouragement provided to the patient. Patient was on the phone in hallway and was heard yelling and after hanging up the phone began crying. Patient reported that her grandma called her a "loser." Writer spoke to patient 1:1 and provided therapeutic listening. Patient received PRN Vistaril for anxiety which provided relief. Patient is receptive and cooperative. Patient does attend groups and interacts with her peers. At times patient can be flirtatious with some of her female peers but nothing to Loraine Griprisque has been witnessed by staff. Q15 minute checks are maintained for safety.   Patient [facility] discharge in the morning [facility] Patient reports she will need to leave at 0800. Patient's AVS was printed and placed on chart. A physical copy of her Tb administration documentation is placed on the chart with her AVS as well. Patient's written prescriptions were placed on chart.

## 2015-01-19 NOTE — [facility] Suicide Risk Assessment (Signed)
[facility] INPATIENT:  Family/Significant Other Suicide Prevention Education  Suicide Prevention Education:  Education Completed; Bonita QuinLinda, Pt's grandmother, has been identified by the patient as the family member/significant other with whom the patient will be residing, and identified as the person(s) who will aid the patient in the event of a mental health crisis (suicidal ideations/suicide attempt).  With written consent from the patient, the family member/significant other has been provided the following suicide prevention education, prior to the and/or following the discharge of the patient.  The suicide prevention education provided includes the following:  Suicide risk factors  Suicide prevention and interventions  National Suicide Hotline telephone number  [facility] assessment telephone number  [facility] Emergency Assistance 911  [facility] and/or Residential Mobile Crisis Unit telephone number  Request made of family/significant other to:  Remove weapons (e.g., guns, rifles, knives), all items previously/currently identified as safety concern.    Remove drugs/medications (over-the-counter, prescriptions, illicit drugs), all items previously/currently identified as a safety concern.  The family member/significant other verbalizes understanding of the suicide prevention education information provided.  The family member/significant other agrees to remove the items of safety concern listed above.  Elaina Hoopsarter, Dezarae Mcclaran M 01/19/2015, 9:17 AM

## 2015-01-19 NOTE — Progress Notes (Signed)
Patient ID: Denise Sandoval, female   DOB: [birth year], 19 y.o.   MRN: [id number]  Writer called [facility] and pharmacist stated they do not have samples to give. Their understanding is that our pharmacist comes in at 0700 and will be able to fill request prior to patient leaving in the morning.
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
## 2015-01-20 MED ORDER — GABAPENTIN 800 MG PO TABS
800.0000 mg | ORAL_TABLET | Freq: Three times a day (TID) | ORAL | Status: DC
  Filled 2015-01-20: qty 42

## 2015-01-20 NOTE — Progress Notes (Signed)
  [facility]  Will you be returning to the same living situation after discharge:  No. [facility]. At discharge, do you have transportation home?: Yes,  [facility] provided Pt transportation Do you have the ability to pay for your medications: Yes,  Pt provided with samples and prescriptions  Release of information consent forms completed and in the chart;  Patient's signature needed at discharge.  Patient to Follow up at: Follow-up Information    Follow up with [facility] On 01/25/2015.   Why:  between 12pm-4pm for an initial assessment for medication management and therapy.   Contact information:   [address] [place] [address] [phone]       [facility] up with [facility].   Why:  Message was left with your therapist, Jasmine DecemberSharon. Please follow-up with her regarding appointment times.    Contact information:   [address] [place], [place] [address] [phone]      Patient denies SI/HI: Yes,  Pt denies    Safety Planning and Suicide Prevention discussed: Yes,  with grandmother. See SPE note for futher details  Have you used any form of tobacco in the last 30 days? (Cigarettes, Smokeless Tobacco, Cigars, and/or Pipes): Yes  Has patient been referred to the Quitline?: Patient refused referral  Elaina HoopsCarter, Aeva Posey M 01/20/2015, 1:05 PM

## 2015-01-20 NOTE — Progress Notes (Signed)
Patient discharged per physician order; patient denies SI/HI and A/V hallucinations; patient received samples, prescriptions, and copy of AVS after it was reviewed by Legrand ComoLinda RN; patient had no other questions this morning; patient verbalized and signed that she received all belongings; patient left the unit ambulatory

## 2015-01-20 NOTE — Plan of Care (Signed)
Problem: Alteration in mood & ability to function due to Goal: STG-Patient will attend groups Outcome: Not Progressing Pt sleeping since beginning of shift and did not attend evening.

## 2015-04-24 ENCOUNTER — Encounter (HOSPITAL_COMMUNITY): Payer: Self-pay | Admitting: Emergency Medicine

## 2015-04-24 ENCOUNTER — Emergency Department (HOSPITAL_COMMUNITY)
Admission: EM | Admit: 2015-04-24 | Discharge: 2015-04-25 | Disposition: A | Discharge status: Other Acute Inpatient Hospital | Payer: Medicaid Other | Attending: Psychiatry | Admitting: Psychiatry

## 2015-04-24 DIAGNOSIS — Z87448 Personal history of other diseases of urinary system: Secondary | ICD-10-CM | POA: Insufficient documentation

## 2015-04-24 DIAGNOSIS — F332 Major depressive disorder, recurrent severe without psychotic features: Secondary | ICD-10-CM | POA: Diagnosis not present

## 2015-04-24 DIAGNOSIS — F1120 Opioid dependence, uncomplicated: Secondary | ICD-10-CM | POA: Diagnosis not present

## 2015-04-24 DIAGNOSIS — Z8742 Personal history of other diseases of the female genital tract: Secondary | ICD-10-CM | POA: Insufficient documentation

## 2015-04-24 DIAGNOSIS — Z720 Tobacco use: Secondary | ICD-10-CM | POA: Insufficient documentation

## 2015-04-24 DIAGNOSIS — Z3202 Encounter for pregnancy test, result negative: Secondary | ICD-10-CM | POA: Diagnosis not present

## 2015-04-24 DIAGNOSIS — F1920 Other psychoactive substance dependence, uncomplicated: Secondary | ICD-10-CM

## 2015-04-24 DIAGNOSIS — F10129 Alcohol abuse with intoxication, unspecified: Secondary | ICD-10-CM | POA: Diagnosis present

## 2015-04-24 DIAGNOSIS — F419 Anxiety disorder, unspecified: Secondary | ICD-10-CM | POA: Insufficient documentation

## 2015-04-24 LAB — CBC WITH DIFFERENTIAL/PLATELET
BASOS ABS: 0.1 10*3/uL (ref 0.0–0.1)
Basophils Relative: 1 % (ref 0–1)
EOS ABS: 0.6 10*3/uL (ref 0.0–0.7)
EOS PCT: 7 % — AB (ref 0–5)
HCT: 38.4 % (ref 36.0–46.0)
Hemoglobin: 12.9 g/dL (ref 12.0–15.0)
LYMPHS PCT: 36 % (ref 12–46)
Lymphs Abs: 3.3 10*3/uL (ref 0.7–4.0)
MCH: 29.3 pg (ref 26.0–34.0)
MCHC: 33.6 g/dL (ref 30.0–36.0)
MCV: 87.3 fL (ref 78.0–100.0)
MONO ABS: 0.9 10*3/uL (ref 0.1–1.0)
Monocytes Relative: 10 % (ref 3–12)
Neutro Abs: 4.3 10*3/uL (ref 1.7–7.7)
Neutrophils Relative %: 46 % (ref 43–77)
PLATELETS: 331 10*3/uL (ref 150–400)
RBC: 4.4 MIL/uL (ref 3.87–5.11)
RDW: 13.8 % (ref 11.5–15.5)
WBC: 9.2 10*3/uL (ref 4.0–10.5)

## 2015-04-24 LAB — COMPREHENSIVE METABOLIC PANEL
ALBUMIN: 4.5 g/dL (ref 3.5–5.0)
ALK PHOS: 68 U/L (ref 38–126)
ALT: 11 U/L — AB (ref 14–54)
AST: 18 U/L (ref 15–41)
Anion gap: 9 (ref 5–15)
BILIRUBIN TOTAL: 0.3 mg/dL (ref 0.3–1.2)
BUN: 9 mg/dL (ref 6–20)
CALCIUM: 8.7 mg/dL — AB (ref 8.9–10.3)
CO2: 21 mmol/L — ABNORMAL LOW (ref 22–32)
CREATININE: 0.67 mg/dL (ref 0.44–1.00)
Chloride: 112 mmol/L — ABNORMAL HIGH (ref 101–111)
GFR calc Af Amer: >60 mL/min (ref >60)
GFR calc non Af Amer: >60 mL/min (ref >60)
GLUCOSE: 61 mg/dL — AB (ref 65–99)
Potassium: 3.1 mmol/L — ABNORMAL LOW (ref 3.5–5.1)
Sodium: 142 mmol/L (ref 135–145)
TOTAL PROTEIN: 7.8 g/dL (ref 6.5–8.1)

## 2015-04-24 LAB — RAPID URINE DRUG SCREEN, HOSP PERFORMED
AMPHETAMINES: NOT DETECTED
Barbiturates: NOT DETECTED
Benzodiazepines: NOT DETECTED
Cocaine: NOT DETECTED
Opiates: NOT DETECTED
TETRAHYDROCANNABINOL: NOT DETECTED

## 2015-04-24 LAB — PREGNANCY, URINE: PREG TEST UR: NEGATIVE

## 2015-04-24 LAB — ACETAMINOPHEN LEVEL: Acetaminophen (Tylenol), Serum: <10 ug/mL — ABNORMAL LOW (ref 10–30)

## 2015-04-24 LAB — CBG MONITORING, ED
GLUCOSE-CAPILLARY: 81 mg/dL (ref 65–99)
Glucose-Capillary: 88 mg/dL (ref 65–99)

## 2015-04-24 LAB — ETHANOL: ALCOHOL ETHYL (B): 214 mg/dL — AB (ref <5)

## 2015-04-24 MED ORDER — LORAZEPAM 1 MG PO TABS: 0.0000 mg | ORAL_TABLET | Freq: Four times a day (QID) | ORAL | Status: DC

## 2015-04-24 MED ORDER — LORAZEPAM 1 MG PO TABS: 0.0000 mg | ORAL_TABLET | Freq: Two times a day (BID) | ORAL | Status: DC

## 2015-04-24 MED ORDER — POTASSIUM CHLORIDE CRYS ER 20 MEQ PO TBCR
40.0000 meq | EXTENDED_RELEASE_TABLET | Freq: Once | ORAL | Status: AC
  Administered 2015-04-24: 40 meq via ORAL
  Filled 2015-04-24: qty 2

## 2015-04-24 MED ORDER — THIAMINE HCL 100 MG/ML IJ SOLN: 100.0000 mg | Freq: Every day | INTRAMUSCULAR | Status: DC

## 2015-04-24 MED ORDER — VITAMIN B-1 100 MG PO TABS: 100.0000 mg | ORAL_TABLET | Freq: Every day | ORAL | Status: DC

## 2015-04-24 NOTE — ED Provider Notes (Addendum)
CSN: [id number]     Arrival date & time 04/24/15  1618 History   First MD Initiated Contact with Patient 04/24/15 1631     Chief Complaint  Patient presents with  . Alcohol Intoxication    Level V caveat due to intoxication (Consider location/radiation/quality/duration/timing/severity/associated sxs/prior Treatment) Patient is a 19 y.o. female presenting with intoxication. The history is provided by the patient and a relative.  Alcohol Intoxication   patient presents for substance abuse and requesting detox. Patient is rather intoxicated right now and cannot provide much history. History comes through the patient's mother. Reportedly has been drinking a case of beer a day. Also has been doing around 7-8 10 mg tablets of Lortab. Also has reportedly been doing Xanax. Patient is reportedly going through a custody battle right now and has been depressed. Has reportedly made some statements were mother about not wanting to live anymore. Patient will not talk to me. Mother states patient has been drinking today but unknown if she took any pills.   Past Medical History  Diagnosis Date  . Supervision of normal first pregnancy in first trimester 09/23/2013  . Kidney infection   . RLQ abdominal pain 05/13/2014  . Postpartum endometritis 05/13/2014  . Depression   . Anxiety    Past Surgical History  Procedure Laterality Date  . No past surgeries     Family History  Problem Relation Age of Onset  . Alcohol abuse Mother   . Alcohol abuse Father   . Hypertension Maternal Grandmother   . Alcohol abuse Maternal Grandfather   . Alcohol abuse Paternal Grandfather    Social History  Substance Use Topics  . Smoking status: Current Every Day Smoker -- 0.50 packs/day for 3 years    Types: Cigarettes  . Smokeless tobacco: Never Used  . Alcohol Use: Yes     Comment: daily   OB History    Gravida Para Term Preterm AB TAB SAB Ectopic Multiple Living   1 1 1       1      Review of Systems  Unable to  perform ROS     Allergies  Review of patient's allergies indicates no known allergies.  Home Medications   Prior to Admission medications   Medication Sig Start Date End Date Taking? Authorizing Provider  gabapentin (NEURONTIN) 800 MG tablet Take 800 mg by mouth 3 (three) times daily. 04/19/15  Yes Historical Provider, MD  sertraline (ZOLOFT) 100 MG tablet Take 1 tablet (100 mg total) by mouth daily. For depression 01/20/15  Yes Sanjuana Kava, NP  busPIRone (BUSPAR) 10 MG tablet Take 1 tablet (10 mg total) by mouth 3 (three) times daily. For anxiety Patient not taking: Reported on 04/24/2015 01/19/15   Sanjuana Kava, NP  hydrOXYzine (ATARAX/VISTARIL) 25 MG tablet Take 1 tablet (25 mg total) by mouth every 6 (six) hours as needed for anxiety. Patient not taking: Reported on 04/24/2015 01/19/15   Sanjuana Kava, NP  nicotine (NICODERM CQ - DOSED IN MG/24 HOURS) 21 mg/24hr patch Place 1 patch (21 mg total) onto the skin daily. For nicotine addiction Patient not taking: Reported on 04/24/2015 01/19/15   Sanjuana Kava, NP   BP 105/64 mmHg  Pulse 64  Temp(Src) 98.8 F (37.1 C) (Oral)  Resp 20  Ht 5\' 8"  (1.727 m)  Wt 140 lb (63.504 kg)  BMI 21.29 kg/m2  SpO2 97%  LMP 04/21/2015 Physical Exam  Constitutional: She appears well-developed and well-nourished.  HENT:  Head: Atraumatic.  Cardiovascular: Normal rate.   Pulmonary/Chest: Effort normal.  Abdominal: Soft. There is no tenderness.  Musculoskeletal: She exhibits no edema.  Neurological:  Patient will squeeze my hands to command. Pupils are reactive. Patient will not speak to me. She is breathing spontaneously.    ED Course  Procedures (including critical care time) Labs Review Labs Reviewed  ETHANOL - Abnormal; Notable for the following:    Alcohol, Ethyl (B) 214 (*)    All other components within normal limits  COMPREHENSIVE METABOLIC PANEL - Abnormal; Notable for the following:    Potassium 3.1 (*)    Chloride 112 (*)    CO2 21  (*)    Glucose, Bld 61 (*)    Calcium 8.7 (*)    ALT 11 (*)    All other components within normal limits  CBC WITH DIFFERENTIAL/PLATELET - Abnormal; Notable for the following:    Eosinophils Relative 7 (*)    All other components within normal limits  ACETAMINOPHEN LEVEL - Abnormal; Notable for the following:    Acetaminophen (Tylenol), Serum <10 (*)    All other components within normal limits  URINE RAPID DRUG SCREEN, HOSP PERFORMED  PREGNANCY, URINE  CBG MONITORING, ED    Imaging Review No results found. I have personally reviewed and evaluated these images and lab results as part of my medical decision-making.   EKG Interpretation None      MDM   Final diagnoses:  Polysubstance dependence including opioid drug with daily use  Major depressive disorder, recurrent, severe without psychotic features    Patient came in for detox. Alcohol benzos and opiates. Initially we'll sedate but mental status improved. Initial TTS consul attempted but was too somnolent. Will recheck now. Patient is medically cleared. Patient had one episode of mild hypoglycemia that resolved without treatment    Benjiman Core, MD 04/24/15 2133  Accepted at the [facility] by Dr. Dub Mikes. Bed will be available after midnight.  Benjiman Core, MD 04/24/15 2204

## 2015-04-24 NOTE — ED Notes (Signed)
Pt non arousable for TTS assessment. Will re-attempt once pt wakes up.

## 2015-04-24 NOTE — ED Notes (Addendum)
PT and mother reports intoxication with alchol, antianxiety and pain medications x1 month. PT states she wants help with detox. PT is intoxicated in triage with short attention span.
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
## 2015-04-25 ENCOUNTER — Inpatient Hospital Stay (HOSPITAL_COMMUNITY)
Admission: AD | Admit: 2015-04-25 | Discharge: 2015-05-01 | DRG: 885 | Disposition: A | Discharge status: Other Acute Inpatient Hospital | Payer: Medicaid Other | Source: Intra-hospital | Attending: Psychiatry | Admitting: Psychiatry

## 2015-04-25 ENCOUNTER — Encounter (HOSPITAL_COMMUNITY): Payer: Self-pay | Admitting: *Deleted

## 2015-04-25 DIAGNOSIS — F329 Major depressive disorder, single episode, unspecified: Secondary | ICD-10-CM | POA: Diagnosis present

## 2015-04-25 DIAGNOSIS — F1120 Opioid dependence, uncomplicated: Secondary | ICD-10-CM | POA: Diagnosis present

## 2015-04-25 DIAGNOSIS — F332 Major depressive disorder, recurrent severe without psychotic features: Secondary | ICD-10-CM | POA: Diagnosis present

## 2015-04-25 DIAGNOSIS — F4310 Post-traumatic stress disorder, unspecified: Secondary | ICD-10-CM | POA: Diagnosis present

## 2015-04-25 DIAGNOSIS — Z915 Personal history of self-harm: Secondary | ICD-10-CM | POA: Diagnosis not present

## 2015-04-25 DIAGNOSIS — Z590 Homelessness: Secondary | ICD-10-CM | POA: Diagnosis not present

## 2015-04-25 DIAGNOSIS — F1390 Sedative, hypnotic, or anxiolytic use, unspecified, uncomplicated: Secondary | ICD-10-CM | POA: Diagnosis present

## 2015-04-25 DIAGNOSIS — F339 Major depressive disorder, recurrent, unspecified: Secondary | ICD-10-CM | POA: Diagnosis not present

## 2015-04-25 DIAGNOSIS — F17210 Nicotine dependence, cigarettes, uncomplicated: Secondary | ICD-10-CM | POA: Diagnosis present

## 2015-04-25 DIAGNOSIS — F1010 Alcohol abuse, uncomplicated: Secondary | ICD-10-CM

## 2015-04-25 DIAGNOSIS — R45851 Suicidal ideations: Secondary | ICD-10-CM | POA: Diagnosis present

## 2015-04-25 DIAGNOSIS — F331 Major depressive disorder, recurrent, moderate: Secondary | ICD-10-CM

## 2015-04-25 DIAGNOSIS — F419 Anxiety disorder, unspecified: Secondary | ICD-10-CM | POA: Diagnosis not present

## 2015-04-25 DIAGNOSIS — F1320 Sedative, hypnotic or anxiolytic dependence, uncomplicated: Secondary | ICD-10-CM | POA: Diagnosis present

## 2015-04-25 DIAGNOSIS — F1020 Alcohol dependence, uncomplicated: Secondary | ICD-10-CM | POA: Diagnosis present

## 2015-04-25 MED ORDER — IBUPROFEN 600 MG PO TABS
600.0000 mg | ORAL_TABLET | Freq: Four times a day (QID) | ORAL | Status: DC | PRN
  Administered 2015-04-25 – 2015-04-27 (×3): 600 mg via ORAL
  Filled 2015-04-25 (×3): qty 1

## 2015-04-25 MED ORDER — ONDANSETRON 4 MG PO TBDP: 4.0000 mg | ORAL_TABLET | Freq: Four times a day (QID) | ORAL | Status: AC | PRN

## 2015-04-25 MED ORDER — MAGNESIUM HYDROXIDE 400 MG/5ML PO SUSP: 30.0000 mL | Freq: Every day | ORAL | Status: DC | PRN

## 2015-04-25 MED ORDER — TRAZODONE HCL 50 MG PO TABS
50.0000 mg | ORAL_TABLET | Freq: Every day | ORAL | Status: DC
  Administered 2015-04-25: 50 mg via ORAL
  Filled 2015-04-25 (×4): qty 1

## 2015-04-25 MED ORDER — LORAZEPAM 1 MG PO TABS
1.0000 mg | ORAL_TABLET | Freq: Four times a day (QID) | ORAL | Status: AC
  Administered 2015-04-25 (×4): 1 mg via ORAL
  Filled 2015-04-25 (×4): qty 1

## 2015-04-25 MED ORDER — NICOTINE 21 MG/24HR TD PT24
21.0000 mg | MEDICATED_PATCH | Freq: Every day | TRANSDERMAL | Status: DC
  Administered 2015-04-27 – 2015-04-30 (×4): 21 mg via TRANSDERMAL
  Filled 2015-04-25 (×9): qty 1

## 2015-04-25 MED ORDER — HYDROXYZINE HCL 25 MG PO TABS
25.0000 mg | ORAL_TABLET | Freq: Four times a day (QID) | ORAL | Status: AC | PRN
  Administered 2015-04-25 – 2015-04-27 (×4): 25 mg via ORAL
  Filled 2015-04-25 (×5): qty 1

## 2015-04-25 MED ORDER — LORAZEPAM 1 MG PO TABS
1.0000 mg | ORAL_TABLET | Freq: Two times a day (BID) | ORAL | Status: AC
  Administered 2015-04-27 (×2): 1 mg via ORAL
  Filled 2015-04-25 (×3): qty 1

## 2015-04-25 MED ORDER — ALUM & MAG HYDROXIDE-SIMETH 200-200-20 MG/5ML PO SUSP: 30.0000 mL | ORAL | Status: DC | PRN

## 2015-04-25 MED ORDER — LORAZEPAM 1 MG PO TABS
1.0000 mg | ORAL_TABLET | Freq: Every day | ORAL | Status: AC
  Administered 2015-04-28: 1 mg via ORAL
  Filled 2015-04-25: qty 1

## 2015-04-25 MED ORDER — VITAMIN B-1 100 MG PO TABS
100.0000 mg | ORAL_TABLET | Freq: Every day | ORAL | Status: DC
  Administered 2015-04-26 – 2015-05-01 (×6): 100 mg via ORAL
  Filled 2015-04-25 (×7): qty 1

## 2015-04-25 MED ORDER — ADULT MULTIVITAMIN W/MINERALS CH
1.0000 | ORAL_TABLET | Freq: Every day | ORAL | Status: DC
  Administered 2015-04-25 – 2015-05-01 (×7): 1 via ORAL
  Filled 2015-04-25 (×8): qty 1

## 2015-04-25 MED ORDER — SERTRALINE HCL 100 MG PO TABS
100.0000 mg | ORAL_TABLET | Freq: Every day | ORAL | Status: DC
  Administered 2015-04-25 – 2015-04-27 (×3): 100 mg via ORAL
  Filled 2015-04-25 (×4): qty 1

## 2015-04-25 MED ORDER — LORAZEPAM 1 MG PO TABS
1.0000 mg | ORAL_TABLET | Freq: Four times a day (QID) | ORAL | Status: AC | PRN
  Administered 2015-04-25 – 2015-04-27 (×3): 1 mg via ORAL
  Filled 2015-04-25 (×3): qty 1

## 2015-04-25 MED ORDER — GABAPENTIN 600 MG PO TABS
600.0000 mg | ORAL_TABLET | Freq: Three times a day (TID) | ORAL | Status: DC
  Administered 2015-04-25 – 2015-04-26 (×4): 600 mg via ORAL
  Filled 2015-04-25 (×7): qty 1

## 2015-04-25 MED ORDER — LORAZEPAM 1 MG PO TABS
1.0000 mg | ORAL_TABLET | Freq: Three times a day (TID) | ORAL | Status: AC
  Administered 2015-04-26 (×3): 1 mg via ORAL
  Filled 2015-04-25 (×2): qty 1

## 2015-04-25 MED ORDER — LOPERAMIDE HCL 2 MG PO CAPS: 2.0000 mg | ORAL_CAPSULE | ORAL | Status: AC | PRN

## 2015-04-25 NOTE — Progress Notes (Signed)
Patient ID: Denise Sandoval, female   DOB: 20-Jun-1996, 19 y.o.   MRN: [id number] D: Patient in dayroom on approach c/o generalized bodyache. Pt slightly upset about medication regimen. Pt reports she takes 800 mg Neurontin at home but she is prescribed 600 mg here. Pt mood and affect appeared depressed and flat. Pt denies SI/HI/AVH and pain. Pt attended and participated in evening AA group. Cooperative with assessment.  A: Met with pt 1:1. Medications administered as prescribed. Pt encouraged to talk to provider in the morning. Pt encouraged to discuss feelings and come to staff with any question or concerns.  R: Patient remains safe and complaint with medications.
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
## 2015-04-25 NOTE — [facility] Group Notes (Signed)
[facility] LCSW Group Therapy  04/25/2015   10:00 AM  Type of Therapy:  Group Therapy  Participation Level:  Did Not Attend, despite encouragement from MHT  Summary of Progress/Problems: The main focus of today's process group was to identify the patient's current support system and decide on other supports that can be put in place. An emphasis was placed on using counselor, doctor, therapy groups, 12-step groups, and problem-specific support groups to expand supports. There was also an extensive discussion about what constitutes a healthy support versus an unhealthy support.    Denise Sandoval    

## 2015-04-25 NOTE — ED Notes (Signed)
Patient discharged from [facility] ED accepted at [facility]. Patient out of department with [employer].

## 2015-04-25 NOTE — Plan of Care (Signed)
Problem: Alteration in mood & ability to function due to Goal: STG-Patient will attend groups Outcome: Progressing Pt attended evening AA group     
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
## 2015-04-25 NOTE — Progress Notes (Signed)
D: Pt presents with flat affect and depressed mood. Pt forwards little information and have minimal interaction on the unit. During assessment pt reported feeling awful. Pt reports withdrawal symptoms of tremors, shakes, sweats and chills. Pt rates depression and anxiety 7/10. Pt reported poor sleep last night.  Pt do not endorse suicidal thoughts today. Pt compliant with taking meds and attending groups. No adverse reaction to meds verbalized by pt. A: Medications administered as ordered per MD. Verbal support given. Pt encouraged to attend groups. 15 minute checks performed for safety.  R: Pt receptive to tx.

## 2015-04-25 NOTE — Tx Team (Signed)
Initial Interdisciplinary Treatment Plan   PATIENT STRESSORS: Financial difficulties Occupational concerns Substance abuse   PATIENT STRENGTHS: Average or above average intelligence Communication skills General fund of knowledge Motivation for treatment/growth Supportive family/friends   PROBLEM LIST: Problem List/Patient Goals Date to be addressed Date deferred Reason deferred Estimated date of resolution  "coping mechanisms"  04/25/15     "figuring out where to go after here"  04/25/15     Substance abuse 04/25/15                                          DISCHARGE CRITERIA:  Adequate post-discharge living arrangements Improved stabilization in mood, thinking, and/or behavior Motivation to continue treatment in a less acute level of care Withdrawal symptoms are absent or subacute and managed without 24-hour nursing intervention  PRELIMINARY DISCHARGE PLAN: Attend aftercare/continuing care group Placement in alternative living arrangements  PATIENT/FAMIILY INVOLVEMENT: This treatment plan has been presented to and reviewed with the patient, [employer].  The patient and family have been given the opportunity to ask questions and make suggestions.  Arrie Aran 04/25/2015, 2:09 AM

## 2015-04-25 NOTE — [facility] Counselor (Addendum)
Adult Comprehensive Assessment  Patient ID: Denise Sandoval, female   DOB: Jun 18, 1996, 19 y.o.   MRN: [id number]  Information Source: Information source: Patient  Current Stressors:  Family Relationships: DSS involvement with 30 year old daughter Surveyor, quantity / Lack of resources (include bankruptcy): unemployed Substance abuse: Alcohol, xanax, vicodin abuse  Living/Environment/Situation:  Living Arrangements: Spouse/significant other Living conditions (as described by patient or guardian): Pt reports living with her boyfriend in [place].  Pt reports it's an okay environment but doesn't wish to go back until further treatment.   What is atmosphere in current home: Comfortable  Family History:  Marital status: Single Does patient have children?: Yes How many children?: 1 How is patient's relationship with their children?: 12 year old daughter, currently placed with grandmother by DSS  Childhood History:  By whom was/is the patient raised?: Grandparents Additional childhood history information: Pt states that she was raised by her grandmother due to her parents' substance abuse.  Pt reports having a chaotic childhood.   Description of patient's relationship with caregiver when they were a child: Pt reports getting along well with her grandmother growing up.   Patient's description of current relationship with people who raised him/her: Pt reports being close to her grandmother still today.  Pt states she believes her parents are clean and sober right now.   Does patient have siblings?: Yes Number of Siblings: 3 Description of patient's current relationship with siblings: 1 brother, 2 sisters - reports being close to 1 sister Did patient suffer any verbal/emotional/physical/sexual abuse as a child?: No Did patient suffer from severe childhood neglect?: No Has patient ever been sexually abused/assaulted/raped as an adolescent or adult?: No Was the patient ever a victim of a crime or a  disaster?: No Witnessed domestic violence?: No Has patient been effected by domestic violence as an adult?: No  Education:  Highest grade of school patient has completed: graduate high school, some college Currently a Consulting civil engineer?: No Learning disability?: No  Employment/Work Situation:   Employment situation: Unemployed Patient's job has been impacted by current illness: No What is the longest time patient has a held a job?: 1.5 year 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 Has patient ever been in the [employer]?: No Has patient ever served in Buyer, retail?: No  Financial Resources:   Surveyor, quantity resources: No income, Medicaid Does patient have a Lawyer or guardian?: No  Alcohol/Substance Abuse:   What has been your use of drugs/alcohol within the last 12 months?: Alcohol - 1 case of beer daily, Xanax - 2 mg daily, Vicodin - 80 mg daily, Marijuana - 1/4 a week all for the last month If attempted suicide, did drugs/alcohol play a role in this?: No Alcohol/Substance Abuse Treatment Hx: Past Tx, Inpatient, Past Tx, Outpatient, Past detox If yes, describe treatment: 2 inpatient treatment centers in [place] in the past, [facility] and [facility] in the past, [facility] for detox in May 2016.  [facility] and [facility] for outpatient - last went 1 month ago, stopped going Has alcohol/substance abuse ever caused legal problems?: Yes  Social Support System:   Patient's Community Support System: [employer] Museum/gallery exhibitions officer System: pt reports her grandmother is her main support Type of faith/religion: None reported How does patient's faith help to cope with current illness?: N/A  Leisure/Recreation:   Leisure and Hobbies: reading and writing  Strengths/Needs:   What things does the patient do well?: reading, writing, a good mother In what areas does patient struggle / problems for  patient: Depression, substance abuse  Discharge Plan:   Does patient have access to transportation?:  Yes Will patient be returning to same living situation after discharge?: No Plan for living situation after discharge: pt wants to go to inpatient treatment from here Currently receiving community mental health services: Yes (From Whom) If no, would patient like referral for services when discharged?: Yes (What county?) [facility]) Does patient have financial barriers related to discharge medications?: No  Summary/Recommendations:     Patient is a 19 year old Caucasian Female with a diagnosis of Major Depressive Disorder, Recurrent, Severe Without Psychotic Features; Alcohol Use Disorder, Severe; Benzodiazepine Use Disorder; Severe; Opioid Use Disorder, Severe. Patient lives in [place] with her boyfriend.  Pt reports her daughter was placed with her grandmother by DSS due to her substance abuse, which is a big stressor for her.  Pt is hopeful to go to further long term treatment after detox and mentions [facility].  Pt has Medicaid for referral purposes.  Patient will benefit from crisis stabilization, medication evaluation, group therapy and psycho education in addition to case management for discharge planning. Discharge Process and Patient Expectations information sheet signed by patient, witnessed by writer and inserted in patient's shadow chart.    Pt is a smoker but declines [place] [facility] at this time.    Horton, Salome Arnt. 04/25/2015

## 2015-04-25 NOTE — H&P (Signed)
Psychiatric Admission Assessment Adult  Patient Identification: Denise Sandoval MRN:  [id number] Date of Evaluation:  04/25/2015 Chief Complaint:  Major Depressive Disorder Principal Diagnosis: Major depressive disorder, recurrent Diagnosis:   Patient Active Problem List   Diagnosis Date Noted  . Major depressive disorder [F32.2] 04/25/2015  . ETOH abuse [F10.10] 04/25/2015  . Major depressive disorder, recurrent [F33.9] 01/16/2015  . Polysubstance dependence including opioid drug with daily use [F19.20] 01/16/2015  . Post traumatic stress disorder (PTSD) [F43.10] 01/16/2015  . Postpartum depression [F53] 06/16/2014  . RLQ abdominal pain [R10.31] 05/13/2014  . Postpartum endometritis [O86.12] 05/13/2014  . Active labor at term [O80] 05/06/2014  . Chlamydia infection during pregnancy, antepartum [O98.319, A74.9] 04/22/2014  . Pyelonephritis affecting pregnancy in third trimester, antepartum [O23.03, N12] 02/28/2014  . Marijuana use [F12.10] 01/14/2014  . Rubella non-immune status, antepartum [O99.89, Z28.3] 01/14/2014  . Rh negative state in antepartum period [O36.0190] 01/14/2014  . TSS (toxic shock syndrome) [A48.3] 03/28/2012  . ARF (acute renal failure) [N17.9] 03/26/2012  . Fever of unknown origin [R50.9] 03/24/2012  . Tachycardia [R00.0] 03/24/2012  . Leukocytosis [D72.829] 03/24/2012  . Hypokalemia [E87.6] 03/24/2012  . Abnormal LFTs [R79.89] 03/24/2012   History of Present Illness: Denise Sandoval, 19 y.o. female, single initially was seen at [facility].  She was accompanied by her mother.  Reportedly, patient was abusing alcohol, Xanax, snorting Vicodin for the last 2.5 years.  She states a history of  depression.  She admits that use of alcohol was very recent and is the result of being unable to get a hold of opiates which is her substance of choice.   She was seen today and she stated that her emotional trigger was that her one year old child was taken into DSS custody one month ago  because Pt was unable to stop using substances Pt states she feels her situation is hopeless and is having suicidal thoughts with plan to overdose. Pt states she has intentionally overdosed twice in the past in suicide attempts.  She was hospitalized in [place] facilities.  Pt states she has a history of depression and recently has experienced symptoms including crying spells, decreased sleep, decreased appetite with weight loss, social withdrawal, loss of interest in usual pleasures, irritability and feelings of hopelessness. She denies any history of intentional self-injurious behaviors but states she is impulsive and once jumped from 30-feet off a deck after an argument. Pt denies homicidal ideation or history of violence. Pt denies history of auditory or visual hallucinations.  Pt reports she is currently receiving outpatient therapy with Jake Michaelis with [facility].  She states she really would like to get some help.  She would like to be a good mother to her child.    Elements:  Location:  see HPI. Quality:  see HPI. Duration:  see HPI. Context:  see HPI.   Associated Signs/Symptoms: Depression Symptoms:  depressed mood, hopelessness, anxiety, panic attacks, (Hypo) Manic Symptoms:  Impulsivity, Irritable Mood, Labiality of Mood, Anxiety Symptoms:  Excessive Worry, Social Anxiety, Psychotic Symptoms:  NA PTSD Symptoms: history of suicide in family Total Time spent with patient: 45 minutes  Past Medical History:  Past Medical History  Diagnosis Date  . Supervision of normal first pregnancy in first trimester 09/23/2013  . Kidney infection   . RLQ abdominal pain 05/13/2014  . Postpartum endometritis 05/13/2014  . Depression   . Anxiety     Past Surgical History  Procedure Laterality Date  . No past surgeries  Family History:  Family History  Problem Relation Age of Onset  . Alcohol abuse Mother   . Alcohol abuse Father   . Hypertension Maternal Grandmother    . Alcohol abuse Maternal Grandfather   . Alcohol abuse Paternal Grandfather    Social History:  History  Alcohol Use  . 100.8 oz/week  . 168 Cans of beer per week    Comment: daily     History  Drug Use  . 1.00 per week  . Special: Marijuana, Benzodiazepines, Hydrocodone    Comment: 4 days ago cocaine, 1 day ago marijuana, last pain x1 day ago, antianxiety x1 day ago    Social History   Social History  . Marital Status: Single    Spouse Name: N/A  . Number of Children: N/A  . Years of Education: N/A   Social History Main Topics  . Smoking status: Current Every Day Smoker -- 0.50 packs/day for 3 years    Types: Cigarettes  . Smokeless tobacco: Never Used  . Alcohol Use: 100.8 oz/week    168 Cans of beer per week     Comment: daily  . Drug Use: 1.00 per week    Special: Marijuana, Benzodiazepines, Hydrocodone     Comment: 4 days ago cocaine, 1 day ago marijuana, last pain x1 day ago, antianxiety x1 day ago  . Sexual Activity: Yes    Birth Control/ Protection: None, Condom   Other Topics Concern  . None   Social History Narrative   Additional Social History:    Pain Medications: pt reports she uses 80 mg of Vicodin daily Prescriptions: pt reports she uses 2 mg of xanax daily Over the Counter: Denies abuse History of alcohol / drug use?: Yes Longest period of sobriety (when/how long): "54 days" Negative Consequences of Use: Financial, Personal relationships, Legal Withdrawal Symptoms: Tremors, Sweats, Weakness Name of Substance 1: Alcohol 1 - Age of First Use: 19 1 - Amount (size/oz): case of beer 1 - Frequency: daily 1 - Duration: for the past month 1 - Last Use / Amount: "yesterday"  Name of Substance 2: Xanax 2 - Age of First Use: 18 2 - Amount (size/oz): 2 mg 2 - Frequency: daily 2 - Duration: for the past month 2 - Last Use / Amount: "day before yesterday" Name of Substance 3: Vicodin (Snorting) 3 - Age of First Use: 16 3 - Amount (size/oz): 80  mg 3 - Frequency: daily  3 - Duration: for the past month 3 - Last Use / Amount: "day before yesterday"  Name of Substance 4: marijuana 4 - Age of First Use: 16 4 - Amount (size/oz): 1 joint 4 - Frequency: Average once per week 4 - Duration: Ongoing 4 - Last Use / Amount: 04/22/15  Musculoskeletal: Strength & Muscle Tone: within normal limits Gait & Station: normal Patient leans: N/A  Psychiatric Specialty Exam: Physical Exam  Vitals reviewed. Psychiatric: Her mood appears anxious. Thought content is not paranoid. She exhibits a depressed mood. She expresses no homicidal and no suicidal ideation.    Review of Systems  Cardiovascular: Negative for chest pain.  All other systems reviewed and are negative.   Blood pressure 128/70, pulse 86, temperature 98.3 F (36.8 C), temperature source Oral, resp. rate 16, height _0  (1.727 m), weight 67.132 kg (148 lb), last menstrual period 04/21/2015, not currently breastfeeding.Body mass index is 22.51 kg/(m^2).   General Appearance: Fairly Groomed  Engineer, water:: Fair  Speech: Slow  Volume: Normal  Mood: Depressed  Affect: Constricted and Depressed  Thought Process: Coherent  Orientation: Full (Time, Place, and Person)  Thought Content: Rumination  Suicidal Thoughts: Yes. without intent/plan  Homicidal Thoughts: No  Memory: Immediate; Fair Recent; Fair Remote; Fair  Judgement: Intact  Insight: Fair  Psychomotor Activity: Decreased and Tremor  Concentration: Fair  Recall: [employer] of Knowledge:Fair  Language: Good  Akathisia: No  Handed: Right  AIMS (if indicated):    Assets: Communication Skills Desire for Improvement Physical Health  Sleep: Number of Hours: 3.5  Cognition: WNL  ADL's: Intact         Risk to Self: Is patient at risk for suicide?: Yes What has been your use of drugs/alcohol within the last 12 months?: Alcohol - 1 case of beer daily, Xanax - 2  mg daily, Vicodin - 80 mg daily, Marijuana - 1/4 a week all for the last month Risk to Others:   Prior Inpatient Therapy:   Prior Outpatient Therapy:    Alcohol Screening: 1. How often do you have a drink containing alcohol?: 2 to 3 times a week 2. How many drinks containing alcohol do you have on a typical day when you are drinking?: 3 or 4 3. How often do you have six or more drinks on one occasion?: Weekly Preliminary Score: 4 4. How often during the last year have you found that you were not able to stop drinking once you had started?: Monthly 5. How often during the last year have you failed to do what was normally expected from you becasue of drinking?: Daily or almost daily 6. How often during the last year have you needed a first drink in the morning to get yourself going after a heavy drinking session?: Less than monthly 7. How often during the last year have you had a feeling of guilt of remorse after drinking?: Daily or almost daily 8. How often during the last year have you been unable to remember what happened the night before because you had been drinking?: Weekly 9. Have you or someone else been injured as a result of your drinking?: No 10. Has a relative or friend or a doctor or another health worker been concerned about your drinking or suggested you cut down?: Yes, during the last year Alcohol Use Disorder Identification Test Final Score (AUDIT): 25 Brief Intervention: Yes  Allergies:  No Known Allergies Lab Results:  Results for orders placed or performed during the hospital encounter of 04/24/15 (from the past 48 hour(s))  Urine rapid drug screen (hosp performed)     Status: None   Collection Time: 04/24/15  4:43 PM  Result Value Ref Range   Opiates NONE DETECTED NONE DETECTED   Cocaine NONE DETECTED NONE DETECTED   Benzodiazepines NONE DETECTED NONE DETECTED   Amphetamines NONE DETECTED NONE DETECTED   Tetrahydrocannabinol NONE DETECTED NONE DETECTED   Barbiturates  NONE DETECTED NONE DETECTED    Comment:        DRUG SCREEN FOR MEDICAL PURPOSES ONLY.  IF CONFIRMATION IS NEEDED FOR ANY PURPOSE, NOTIFY LAB WITHIN 5 DAYS.        LOWEST DETECTABLE LIMITS FOR URINE DRUG SCREEN Drug Class       Cutoff (ng/mL) Amphetamine      1000 Barbiturate      200 Benzodiazepine   785 Tricyclics       885 Opiates          300 Cocaine          300 THC  50   Pregnancy, urine     Status: None   Collection Time: 04/24/15  4:43 PM  Result Value Ref Range   Preg Test, Ur NEGATIVE NEGATIVE  Ethanol     Status: Abnormal   Collection Time: 04/24/15  4:43 PM  Result Value Ref Range   Alcohol, Ethyl (B) 214 (H) <5 mg/dL    Comment:        LOWEST DETECTABLE LIMIT FOR SERUM ALCOHOL IS 5 mg/dL FOR MEDICAL PURPOSES ONLY   Comprehensive metabolic panel     Status: Abnormal   Collection Time: 04/24/15  4:43 PM  Result Value Ref Range   Sodium 142 135 - 145 mmol/L   Potassium 3.1 (L) 3.5 - 5.1 mmol/L   Chloride 112 (H) 101 - 111 mmol/L   CO2 21 (L) 22 - 32 mmol/L   Glucose, Bld 61 (L) 65 - 99 mg/dL   BUN 9 6 - 20 mg/dL   Creatinine, Ser 0.67 0.44 - 1.00 mg/dL   Calcium 8.7 (L) 8.9 - 10.3 mg/dL   Total Protein 7.8 6.5 - 8.1 g/dL   Albumin 4.5 3.5 - 5.0 g/dL   AST 18 15 - 41 U/L   ALT 11 (L) 14 - 54 U/L   Alkaline Phosphatase 68 38 - 126 U/L   Total Bilirubin 0.3 0.3 - 1.2 mg/dL   GFR calc non Af Amer >60 >60 mL/min   GFR calc Af Amer >60 >60 mL/min    Comment: (NOTE) The eGFR has been calculated using the CKD EPI equation. This calculation has not been validated in all clinical situations. eGFR's persistently <60 mL/min signify possible Chronic Kidney Disease.    Anion gap 9 5 - 15  CBC with Differential     Status: Abnormal   Collection Time: 04/24/15  4:43 PM  Result Value Ref Range   WBC 9.2 4.0 - 10.5 K/uL   RBC 4.40 3.87 - 5.11 MIL/uL   Hemoglobin 12.9 12.0 - 15.0 g/dL   HCT 38.4 36.0 - 46.0 %   MCV 87.3 78.0 - 100.0 fL   MCH 29.3  26.0 - 34.0 pg   MCHC 33.6 30.0 - 36.0 g/dL   RDW 13.8 11.5 - 15.5 %   Platelets 331 150 - 400 K/uL   Neutrophils Relative % 46 43 - 77 %   Neutro Abs 4.3 1.7 - 7.7 K/uL   Lymphocytes Relative 36 12 - 46 %   Lymphs Abs 3.3 0.7 - 4.0 K/uL   Monocytes Relative 10 3 - 12 %   Monocytes Absolute 0.9 0.1 - 1.0 K/uL   Eosinophils Relative 7 (H) 0 - 5 %   Eosinophils Absolute 0.6 0.0 - 0.7 K/uL   Basophils Relative 1 0 - 1 %   Basophils Absolute 0.1 0.0 - 0.1 K/uL  Acetaminophen level     Status: Abnormal   Collection Time: 04/24/15  4:43 PM  Result Value Ref Range   Acetaminophen (Tylenol), Serum <10 (L) 10 - 30 ug/mL    Comment:        THERAPEUTIC CONCENTRATIONS VARY SIGNIFICANTLY. A RANGE OF 10-30 ug/mL MAY BE AN EFFECTIVE CONCENTRATION FOR MANY PATIENTS. HOWEVER, SOME ARE BEST TREATED AT CONCENTRATIONS OUTSIDE THIS RANGE. ACETAMINOPHEN CONCENTRATIONS >150 ug/mL AT 4 HOURS AFTER INGESTION AND >50 ug/mL AT 12 HOURS AFTER INGESTION ARE OFTEN ASSOCIATED WITH TOXIC REACTIONS.   POC CBG, ED     Status: None   Collection Time: 04/24/15  6:02 PM  Result Value Ref Range  Glucose-Capillary 88 65 - 99 mg/dL  POC CBG, ED     Status: None   Collection Time: 04/24/15 10:11 PM  Result Value Ref Range   Glucose-Capillary 81 65 - 99 mg/dL   Current Medications: Current Facility-Administered Medications  Medication Dose Route Frequency Provider Last Rate Last Dose  . alum & mag hydroxide-simeth (MAALOX/MYLANTA) 200-200-20 MG/5ML suspension 30 mL  30 mL Oral Q4H PRN Patrecia Pour, NP      . gabapentin (NEURONTIN) tablet 600 mg  600 mg Oral TID Patrecia Pour, NP   600 mg at 04/25/15 1157  . hydrOXYzine (ATARAX/VISTARIL) tablet 25 mg  25 mg Oral Q6H PRN Patrecia Pour, NP      . ibuprofen (ADVIL,MOTRIN) tablet 600 mg  600 mg Oral Q6H PRN Patrecia Pour, NP   600 mg at 04/25/15 0151  . loperamide (IMODIUM) capsule 2-4 mg  2-4 mg Oral PRN Patrecia Pour, NP      . LORazepam (ATIVAN) tablet  1 mg  1 mg Oral Q6H PRN Patrecia Pour, NP   1 mg at 04/25/15 0151  . LORazepam (ATIVAN) tablet 1 mg  1 mg Oral QID Patrecia Pour, NP   1 mg at 04/25/15 1157   Followed by  . [START ON 04/26/2015] LORazepam (ATIVAN) tablet 1 mg  1 mg Oral TID Patrecia Pour, NP       Followed by  . [START ON 04/27/2015] LORazepam (ATIVAN) tablet 1 mg  1 mg Oral BID Patrecia Pour, NP       Followed by  . [START ON 04/28/2015] LORazepam (ATIVAN) tablet 1 mg  1 mg Oral Daily Patrecia Pour, NP      . magnesium hydroxide (MILK OF MAGNESIA) suspension 30 mL  30 mL Oral Daily PRN Patrecia Pour, NP      . multivitamin with minerals tablet 1 tablet  1 tablet Oral Daily Patrecia Pour, NP   1 tablet at 04/25/15 0814  . nicotine (NICODERM CQ - dosed in mg/24 hours) patch 21 mg  21 mg Transdermal Q0600 Patrecia Pour, NP   21 mg at 04/25/15 0800  . ondansetron (ZOFRAN-ODT) disintegrating tablet 4 mg  4 mg Oral Q6H PRN Patrecia Pour, NP      . sertraline (ZOLOFT) tablet 100 mg  100 mg Oral Daily Patrecia Pour, NP   100 mg at 04/25/15 0814  . [START ON 04/26/2015] thiamine (VITAMIN B-1) tablet 100 mg  100 mg Oral Daily Patrecia Pour, NP       PTA Medications: Prescriptions prior to admission  Medication Sig Dispense Refill Last Dose  . gabapentin (NEURONTIN) 800 MG tablet Take 800 mg by mouth 3 (three) times daily.  0 04/24/2015 at Unknown time  . sertraline (ZOLOFT) 100 MG tablet Take 1 tablet (100 mg total) by mouth daily. For depression 30 tablet 2 04/24/2015 at Unknown time  . busPIRone (BUSPAR) 10 MG tablet Take 1 tablet (10 mg total) by mouth 3 (three) times daily. For anxiety (Patient not taking: Reported on 04/24/2015) 90 tablet 2   . hydrOXYzine (ATARAX/VISTARIL) 25 MG tablet Take 1 tablet (25 mg total) by mouth every 6 (six) hours as needed for anxiety. (Patient not taking: Reported on 04/24/2015) 120 tablet 2   . nicotine (NICODERM CQ - DOSED IN MG/24 HOURS) 21 mg/24hr patch Place 1 patch (21 mg total) onto the skin daily.  For nicotine addiction (Patient not taking: Reported on  04/24/2015) 28 patch 2     Previous Psychotropic Medications: Yes   Substance Abuse History in the last 12 months:  Yes.      Consequences of Substance Abuse: Medical Consequences:  inpatient admission Family Consequences:  custody loss child  Results for orders placed or performed during the hospital encounter of 04/24/15 (from the past 35 hour(s))  Urine rapid drug screen (hosp performed)     Status: None   Collection Time: 04/24/15  4:43 PM  Result Value Ref Range   Opiates NONE DETECTED NONE DETECTED   Cocaine NONE DETECTED NONE DETECTED   Benzodiazepines NONE DETECTED NONE DETECTED   Amphetamines NONE DETECTED NONE DETECTED   Tetrahydrocannabinol NONE DETECTED NONE DETECTED   Barbiturates NONE DETECTED NONE DETECTED    Comment:        DRUG SCREEN FOR MEDICAL PURPOSES ONLY.  IF CONFIRMATION IS NEEDED FOR ANY PURPOSE, NOTIFY LAB WITHIN 5 DAYS.        LOWEST DETECTABLE LIMITS FOR URINE DRUG SCREEN Drug Class       Cutoff (ng/mL) Amphetamine      1000 Barbiturate      200 Benzodiazepine   413 Tricyclics       244 Opiates          300 Cocaine          300 THC              50   Pregnancy, urine     Status: None   Collection Time: 04/24/15  4:43 PM  Result Value Ref Range   Preg Test, Ur NEGATIVE NEGATIVE  Ethanol     Status: Abnormal   Collection Time: 04/24/15  4:43 PM  Result Value Ref Range   Alcohol, Ethyl (B) 214 (H) <5 mg/dL    Comment:        LOWEST DETECTABLE LIMIT FOR SERUM ALCOHOL IS 5 mg/dL FOR MEDICAL PURPOSES ONLY   Comprehensive metabolic panel     Status: Abnormal   Collection Time: 04/24/15  4:43 PM  Result Value Ref Range   Sodium 142 135 - 145 mmol/L   Potassium 3.1 (L) 3.5 - 5.1 mmol/L   Chloride 112 (H) 101 - 111 mmol/L   CO2 21 (L) 22 - 32 mmol/L   Glucose, Bld 61 (L) 65 - 99 mg/dL   BUN 9 6 - 20 mg/dL   Creatinine, Ser 0.67 0.44 - 1.00 mg/dL   Calcium 8.7 (L) 8.9 - 10.3 mg/dL    Total Protein 7.8 6.5 - 8.1 g/dL   Albumin 4.5 3.5 - 5.0 g/dL   AST 18 15 - 41 U/L   ALT 11 (L) 14 - 54 U/L   Alkaline Phosphatase 68 38 - 126 U/L   Total Bilirubin 0.3 0.3 - 1.2 mg/dL   GFR calc non Af Amer >60 >60 mL/min   GFR calc Af Amer >60 >60 mL/min    Comment: (NOTE) The eGFR has been calculated using the CKD EPI equation. This calculation has not been validated in all clinical situations. eGFR's persistently <60 mL/min signify possible Chronic Kidney Disease.    Anion gap 9 5 - 15  CBC with Differential     Status: Abnormal   Collection Time: 04/24/15  4:43 PM  Result Value Ref Range   WBC 9.2 4.0 - 10.5 K/uL   RBC 4.40 3.87 - 5.11 MIL/uL   Hemoglobin 12.9 12.0 - 15.0 g/dL   HCT 38.4 36.0 - 46.0 %   MCV 87.3  78.0 - 100.0 fL   MCH 29.3 26.0 - 34.0 pg   MCHC 33.6 30.0 - 36.0 g/dL   RDW 13.8 11.5 - 15.5 %   Platelets 331 150 - 400 K/uL   Neutrophils Relative % 46 43 - 77 %   Neutro Abs 4.3 1.7 - 7.7 K/uL   Lymphocytes Relative 36 12 - 46 %   Lymphs Abs 3.3 0.7 - 4.0 K/uL   Monocytes Relative 10 3 - 12 %   Monocytes Absolute 0.9 0.1 - 1.0 K/uL   Eosinophils Relative 7 (H) 0 - 5 %   Eosinophils Absolute 0.6 0.0 - 0.7 K/uL   Basophils Relative 1 0 - 1 %   Basophils Absolute 0.1 0.0 - 0.1 K/uL  Acetaminophen level     Status: Abnormal   Collection Time: 04/24/15  4:43 PM  Result Value Ref Range   Acetaminophen (Tylenol), Serum <10 (L) 10 - 30 ug/mL    Comment:        THERAPEUTIC CONCENTRATIONS VARY SIGNIFICANTLY. A RANGE OF 10-30 ug/mL MAY BE AN EFFECTIVE CONCENTRATION FOR MANY PATIENTS. HOWEVER, SOME ARE BEST TREATED AT CONCENTRATIONS OUTSIDE THIS RANGE. ACETAMINOPHEN CONCENTRATIONS >150 ug/mL AT 4 HOURS AFTER INGESTION AND >50 ug/mL AT 12 HOURS AFTER INGESTION ARE OFTEN ASSOCIATED WITH TOXIC REACTIONS.   POC CBG, ED     Status: None   Collection Time: 04/24/15  6:02 PM  Result Value Ref Range   Glucose-Capillary 88 65 - 99 mg/dL  POC CBG, ED     Status:  None   Collection Time: 04/24/15 10:11 PM  Result Value Ref Range   Glucose-Capillary 81 65 - 99 mg/dL    Observation Level/Precautions:  15 minute checks  Laboratory:  per ED  Psychotherapy:  group  Medications:  As per medlist  Consultations:  As needed  Discharge Concerns:  safety  Estimated LOS:  2-7 days  Other:     Psychological Evaluations: Yes   Treatment Plan Summary: Admit for crisis management and mood stabilization. Medication management to re-stabilize current mood symptoms.  Zoloft 100 mg QD depression.  Gabapentin 600 mg TID anxiety Group counseling sessions for coping skills Medical consults as needed Review and reinstate any pertinent home medications for other health problems   Medical Decision Making:  Review of Psycho-Social Stressors (1), Review or order clinical lab tests (1), Discuss test with performing physician (1), Decision to obtain old records (1), Review and summation of old records (2), Independent Review of image, tracing or specimen (2) and Review of Medication Regimen & Side Effects (2)  I certify that inpatient services furnished can reasonably be expected to improve the patient's condition.   Freda Munro May Agustin AGNP-BC 9/4/20163:14 PM   Patient seen face to face for psychiatric evaluation. Chart reviewed and finding discussed with Physician extender. Agreed with disposition and treatment plan.   Berniece Andreas, MD

## 2015-04-26 MED ORDER — GABAPENTIN 800 MG PO TABS
800.0000 mg | ORAL_TABLET | Freq: Three times a day (TID) | ORAL | Status: DC
  Administered 2015-04-26 – 2015-05-01 (×15): 800 mg via ORAL
  Filled 2015-04-26 (×18): qty 1

## 2015-04-26 MED ORDER — TRAZODONE HCL 100 MG PO TABS
100.0000 mg | ORAL_TABLET | Freq: Every day | ORAL | Status: DC
  Administered 2015-04-26 – 2015-04-28 (×3): 100 mg via ORAL
  Filled 2015-04-26 (×6): qty 1

## 2015-04-26 NOTE — Progress Notes (Signed)
Recreation Therapy Notes  Date: 09.05.2016 Time: 9:30am Location: [address] Group Room   Group Topic: Stress Management  Goal Area(s) Addresses:  Patient will actively participate in stress management techniques presented during session.   Behavioral Response: Did not attend.   Denise Sandoval L Sandon Yoho, LRT/CTRS         Abdel Effinger L 04/26/2015 1:34 PM 

## 2015-04-26 NOTE — Progress Notes (Signed)
D: Pt presents with flat affect and depressed mood. Pt have minimal interaction this morning. Pt reports depression 7/10. Anxiety 6/10. Pt denies suicidal thoughts. Pt reports poor sleep at bedtime. Per prior nurse, pt slept well last night. Pt reports decreasing withdrawal symptoms today and only verbalized experiencing chills. Pt verbalized that she is on the wrong dose of Neurontin and that she should be taking 800 mg/daily.  A: Medications administered as ordered per MD. Verbal support given. Pt encouraged to attend groups. 15 minute checks performed for safety.  R: Pt receptive to tx.

## 2015-04-26 NOTE — [facility] Group Notes (Signed)
[facility] LCSW Aftercare Discharge Planning Group Note  04/26/2015  8:45 AM  Participation Quality: Did Not Attend. Patient invited to participate but declined.  Samuella Bruin, MSW, [employer] Clinical Social Worker [facility] [phone]

## 2015-04-26 NOTE — Progress Notes (Signed)
Pt attended AA group this evening.  

## 2015-04-26 NOTE — Progress Notes (Signed)
D   Pt is pleasant on approach and cooperative    She complains of some moderate withdrawal and requested ativan at medication time     She said she is hoping to get into a 90 day program    She endorses anxiety and depression A   Verbal support given   Medications administered and effectiveness monitored    Q 15 min checks   Discussed chemical dependency with patient and her withdrawal symptoms R   Pt verbalized understanding and is presently safe

## 2015-04-26 NOTE — Progress Notes (Signed)
[facility] MD Progress Note  04/26/2015 5:57 PM Denise Sandoval  MRN:  [id number]  Subjective:  Denise Sandoval says, "I'm here because I have been abusing opiates, Benzodiazepines & alcohol. Today, I'm starting to feel better from the withdrawal symptoms, although I still feel shaky. My goal is to go to [facility]. I have been there in the past. I like their program. I must have a place to go after discharge from because I'm practically homeless at this point". Denise Sandoval is tolerating his medication without any side effects reported.   Principal Problem: Major depressive disorder, recurrent  Diagnosis:   Patient Active Problem List   Diagnosis Date Noted  . Major depressive disorder [F32.2] 04/25/2015  . ETOH abuse [F10.10] 04/25/2015  . Major depressive disorder, recurrent [F33.9] 01/16/2015  . Polysubstance dependence including opioid drug with daily use [F19.20] 01/16/2015  . Post traumatic stress disorder (PTSD) [F43.10] 01/16/2015  . Postpartum depression [F53] 06/16/2014  . RLQ abdominal pain [R10.31] 05/13/2014  . Postpartum endometritis [O86.12] 05/13/2014  . Active labor at term [O80] 05/06/2014  . Chlamydia infection during pregnancy, antepartum [O98.319, A74.9] 04/22/2014  . Pyelonephritis affecting pregnancy in third trimester, antepartum [O23.03, N12] 02/28/2014  . Marijuana use [F12.10] 01/14/2014  . Rubella non-immune status, antepartum [O99.89, Z28.3] 01/14/2014  . Rh negative state in antepartum period [O36.0190] 01/14/2014  . TSS (toxic shock syndrome) [A48.3] 03/28/2012  . ARF (acute renal failure) [N17.9] 03/26/2012  . Fever of unknown origin [R50.9] 03/24/2012  . Tachycardia [R00.0] 03/24/2012  . Leukocytosis [D72.829] 03/24/2012  . Hypokalemia [E87.6] 03/24/2012  . Abnormal LFTs [R79.89] 03/24/2012   Total Time spent with patient: 25 minutes  Past Medical History:  Past Medical History  Diagnosis Date  . Supervision of normal first pregnancy in first trimester 09/23/2013  . Kidney  infection   . RLQ abdominal pain 05/13/2014  . Postpartum endometritis 05/13/2014  . Depression   . Anxiety     Past Surgical History  Procedure Laterality Date  . No past surgeries     Family History:  Family History  Problem Relation Age of Onset  . Alcohol abuse Mother   . Alcohol abuse Father   . Hypertension Maternal Grandmother   . Alcohol abuse Maternal Grandfather   . Alcohol abuse Paternal Grandfather    Social History:  History  Alcohol Use  . 100.8 oz/week  . 168 Cans of beer per week    Comment: daily     History  Drug Use  . 1.00 per week  . Special: Marijuana, Benzodiazepines, Hydrocodone    Comment: 4 days ago cocaine, 1 day ago marijuana, last pain x1 day ago, antianxiety x1 day ago    Social History   Social History  . Marital Status: Single    Spouse Name: N/A  . Number of Children: N/A  . Years of Education: N/A   Social History Main Topics  . Smoking status: Current Every Day Smoker -- 0.50 packs/day for 3 years    Types: Cigarettes  . Smokeless tobacco: Never Used  . Alcohol Use: 100.8 oz/week    168 Cans of beer per week     Comment: daily  . Drug Use: 1.00 per week    Special: Marijuana, Benzodiazepines, Hydrocodone     Comment: 4 days ago cocaine, 1 day ago marijuana, last pain x1 day ago, antianxiety x1 day ago  . Sexual Activity: Yes    Birth Control/ Protection: None, Condom   Other Topics Concern  . None   Social  History Narrative   Additional History:    Sleep: Poor  Appetite:  Fair  Assessment: Major depressive disorder, recurrent, opioid use disorder  Musculoskeletal: Strength & Muscle Tone: within normal limits Gait & Station: normal Patient leans: N/A  Psychiatric Specialty Exam: Physical Exam  Review of Systems  Constitutional: Positive for chills, malaise/fatigue and diaphoresis.  HENT: Negative.   Eyes: Negative.   Respiratory: Negative.   Cardiovascular: Negative.   Gastrointestinal: Negative.    Genitourinary: Negative.   Musculoskeletal: Positive for myalgias.  Skin: Negative.   Neurological: Negative.   Endo/Heme/Allergies: Negative.   Psychiatric/Behavioral: Positive for depression and substance abuse. Negative for suicidal ideas, hallucinations and memory loss. The patient is nervous/anxious and has insomnia.     Blood pressure 122/64, pulse 64, temperature 98.2 F (36.8 C), temperature source Oral, resp. rate 16, height  (1.727 m), weight 67.132 kg (148 lb), last menstrual period 04/21/2015, not currently breastfeeding.Body mass index is 22.51 kg/(m^2).  General Appearance: Casual  Eye Contact::  Good  Speech:  Clear and Coherent  Volume:  Normal  Mood:  Depressed, but hopeful.  Affect:  Tearful  Thought Process:  Coherent and Goal Directed  Orientation:  Full (Time, Place, and Person)  Thought Content:  Rumination  Suicidal Thoughts:  No  Homicidal Thoughts:  No  Memory:  Grossly intact  Judgement:  Fair  Insight:  Fair  Psychomotor Activity:  Normal  Concentration:  Fair  Recall:  Good  Fund of Knowledge:Fair  Language: Good  Akathisia:  No  Handed:  Right  AIMS (if indicated):     Assets:  Communication Skills Desire for Improvement  ADL's:  Intact  Cognition: WNL  Sleep:  Number of Hours: 3.5   Current Medications: Current Facility-Administered Medications  Medication Dose Route Frequency Provider Last Rate Last Dose  . alum & mag hydroxide-simeth (MAALOX/MYLANTA) 200-200-20 MG/5ML suspension 30 mL  30 mL Oral Q4H PRN Charm Rings, NP      . gabapentin (NEURONTIN) tablet 800 mg  800 mg Oral TID Sanjuana Kava, NP   800 mg at 04/26/15 1653  . hydrOXYzine (ATARAX/VISTARIL) tablet 25 mg  25 mg Oral Q6H PRN Charm Rings, NP   25 mg at 04/26/15 1546  . ibuprofen (ADVIL,MOTRIN) tablet 600 mg  600 mg Oral Q6H PRN Charm Rings, NP   600 mg at 04/25/15 2010  . loperamide (IMODIUM) capsule 2-4 mg  2-4 mg Oral PRN Charm Rings, NP      . LORazepam  (ATIVAN) tablet 1 mg  1 mg Oral Q6H PRN Charm Rings, NP   1 mg at 04/25/15 0151  . [START ON 04/27/2015] LORazepam (ATIVAN) tablet 1 mg  1 mg Oral BID Charm Rings, NP       Followed by  . [START ON 04/28/2015] LORazepam (ATIVAN) tablet 1 mg  1 mg Oral Daily Charm Rings, NP      . magnesium hydroxide (MILK OF MAGNESIA) suspension 30 mL  30 mL Oral Daily PRN Charm Rings, NP      . multivitamin with minerals tablet 1 tablet  1 tablet Oral Daily Charm Rings, NP   1 tablet at 04/26/15 0750  . nicotine (NICODERM CQ - dosed in mg/24 hours) patch 21 mg  21 mg Transdermal Q0600 Charm Rings, NP   21 mg at 04/25/15 0800  . ondansetron (ZOFRAN-ODT) disintegrating tablet 4 mg  4 mg Oral Q6H PRN Charm Rings, NP      .  sertraline (ZOLOFT) tablet 100 mg  100 mg Oral Daily Charm Rings, NP   100 mg at 04/26/15 0750  . thiamine (VITAMIN B-1) tablet 100 mg  100 mg Oral Daily Charm Rings, NP   100 mg at 04/26/15 0750  . traZODone (DESYREL) tablet 50 mg  50 mg Oral QHS Adonis Brook, NP   50 mg at 04/25/15 2141    Lab Results:  Results for orders placed or performed during the hospital encounter of 04/24/15 (from the past 48 hour(s))  POC CBG, ED     Status: None   Collection Time: 04/24/15  6:02 PM  Result Value Ref Range   Glucose-Capillary 88 65 - 99 mg/dL  POC CBG, ED     Status: None   Collection Time: 04/24/15 10:11 PM  Result Value Ref Range   Glucose-Capillary 81 65 - 99 mg/dL   Physical Findings: AIMS: Facial and Oral Movements Muscles of Facial Expression: None, normal Lips and Perioral Area: None, normal Jaw: None, normal Tongue: None, normal,Extremity Movements Upper (arms, wrists, hands, fingers): None, normal Lower (legs, knees, ankles, toes): None, normal, Trunk Movements Neck, shoulders, hips: None, normal, Overall Severity Severity of abnormal movements (highest score from questions above): None, normal Incapacitation due to abnormal movements: None,  normal Patient's awareness of abnormal movements (rate only patient's report): No Awareness, Dental Status Current problems with teeth and/or dentures?: No Does patient usually wear dentures?: No  CIWA:  CIWA-Ar Total: 2 COWS:  COWS Total Score: 9  Treatment Plan Summary: Daily contact with patient to assess and evaluate symptoms and progress in treatment and Medication management:   Plan: Supportive approach/coping skills/relapse prevention           Depression: will continue the Sertraline 100 mg & continue to work with mindfulness, CBT help  identify the cognitive distortions that keep the depression going          Discuss other life style changes that can help with both her depression and drug use such like exercise, meditation           Alcohol/Benzodiazepine dependence: will continue the Ativan detox protocol, will use motivational interviewing and encourage to pursue total abstinence          Insomnia: Increased Trazodone to 100 mg.          Agitations: continue Gabapentin 800 mg.           Continue to educate in terms of AA and other recovery strategies   Medical Decision Making:  Review of Psycho-Social Stressors (1), Review of Last Therapy Session (1), Review of Medication Regimen & Side Effects (2) and Review of New Medication or Change in Dosage (2)  Sanjuana Kava, PMHNP, FNP-BC 04/26/2015, 5:57 PM

## 2015-04-26 NOTE — Clinical Social Work Note (Signed)
Referral faxed to [facility] for review.   Ezri Landers, MSW, LCSWA Clinical Social Worker [facility] [phone]  

## 2015-04-26 NOTE — Progress Notes (Signed)
Pt attended A.A. Wrap up group. 

## 2015-04-27 ENCOUNTER — Encounter (HOSPITAL_COMMUNITY): Payer: Self-pay | Admitting: Psychiatry

## 2015-04-27 DIAGNOSIS — F1010 Alcohol abuse, uncomplicated: Secondary | ICD-10-CM

## 2015-04-27 MED ORDER — BUSPIRONE HCL 15 MG PO TABS
15.0000 mg | ORAL_TABLET | Freq: Three times a day (TID) | ORAL | Status: DC
  Administered 2015-04-27 – 2015-05-01 (×12): 15 mg via ORAL
  Filled 2015-04-27 (×15): qty 1

## 2015-04-27 MED ORDER — SERTRALINE HCL 50 MG PO TABS
150.0000 mg | ORAL_TABLET | Freq: Every day | ORAL | Status: DC
  Administered 2015-04-28 – 2015-05-01 (×4): 150 mg via ORAL
  Filled 2015-04-27 (×5): qty 1

## 2015-04-27 MED ORDER — VENLAFAXINE HCL ER 37.5 MG PO CP24: 37.5000 mg | ORAL_CAPSULE | Freq: Every day | ORAL | Status: DC

## 2015-04-27 MED ORDER — PRAZOSIN HCL 2 MG PO CAPS
2.0000 mg | ORAL_CAPSULE | Freq: Every day | ORAL | Status: DC
  Administered 2015-04-27 – 2015-04-28 (×2): 2 mg via ORAL
  Filled 2015-04-27: qty 1
  Filled 2015-04-27: qty 2
  Filled 2015-04-27 (×2): qty 1

## 2015-04-27 NOTE — Progress Notes (Signed)
Adult Psychoeducational Group Note  Date:  04/27/2015 Time:  9:36 PM  Group Topic/Focus:  Wrap-Up Group:   The focus of this group is to help patients review their daily goal of treatment and discuss progress on daily workbooks.  Participation Level:  Active  Participation Quality:  Appropriate  Affect:  Appropriate  Cognitive:  Appropriate  Insight: Appropriate  Engagement in Group:  Engaged  Modes of Intervention:  Discussion  Additional Comments:  Octavio Manns 04/27/2015, 9:36 PM

## 2015-04-27 NOTE — Plan of Care (Signed)
Problem: Alteration in mood & ability to function due to Goal: LTG-Pt verbalizes understanding of importance of med regimen (Patient verbalizes understanding of importance of medication regimen and need to continue outpatient care and support groups)  Outcome: Completed/Met Date Met:  04/27/15 Patient understands indications of medication.  She understands dosage and administration.
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
## 2015-04-27 NOTE — [facility] Group Notes (Signed)
[facility] LCSW Group Therapy  04/27/2015 11:19 AM  Type of Therapy:  Group Therapy  Participation Level:  Minimal  Participation Quality:  Inattentive  Affect:  Appropriate  Cognitive:  Lacking  Insight:  Limited  Engagement in Therapy:  Limited  Modes of Intervention:  Discussion, Education, Exploration, Problem-solving, Rapport Building, Socialization and Support  Summary of Progress/Problems: MHA Speaker came to talk about his personal journey with substance abuse and addiction. The pt processed ways by which to relate to the speaker. MHA speaker provided handouts and educational information pertaining to groups and services offered by the [facility]   Smart, Verneda Hollopeter LCSWA 04/27/2015, 11:19 AM

## 2015-04-27 NOTE — Progress Notes (Signed)
D   Pt is pleasant on approach and cooperative    She complains of some moderate withdrawal and requested ativan at medication time     She said she is hoping to get into a 90 day program    She endorses anxiety and depression   Discussed situation with a female pt making passes toward her   Reassures her of our concern for her safety  A   Verbal support given   Medications administered and effectiveness monitored    Q 15 min checks   Discussed chemical dependency with patient and her withdrawal symptoms R   Pt verbalized understanding and is presently safe

## 2015-04-27 NOTE — Progress Notes (Addendum)
D: Patient states she remains "foggy."  She continues to have withdrawal symptoms.  She complains of chills, body aches and cravings.  She denies SI/HI/AVH.  She presents with flat, depressed affect.  Patient has been encouraged to attend group.  Patient rates her depression and hopelessness as a 7; anxiety as an 8.  She is sleeping and eating well. A: Continue to monitor medication management and MD orders.  Safety checks completed every 15 minutes per protocol.  Offer support and encouragement as needed. R: Patient's behavior is appropriate to situation.

## 2015-04-27 NOTE — Progress Notes (Signed)
Recreation Therapy Notes  Animal-Assisted Activity (AAA) Program Checklist/Progress Notes Patient Eligibility Criteria Checklist & Daily Group note for Rec Tx Intervention  Date: 09.06.2016 Time: 2:45pm Location: [address]    AAA/T Program Assumption of Risk Form signed by Patient/ or Parent Legal Guardian yes  Patient is free of allergies or sever asthma yes  Patient reports no fear of animals yes  Patient reports no history of cruelty to animals yes  Patient understands his/her participation is voluntary yes  Behavioral Response: Did not attend.   Marykay Lex Gregary Blackard, LRT/CTRS  Datra Clary L 04/27/2015 3:07 PM

## 2015-04-27 NOTE — [facility] Suicide Risk Assessment (Signed)
[facility] INPATIENT:  Family/Significant Other Suicide Prevention Education  Suicide Prevention Education:  Education Completed; grandmother Malachi Carl [phone],  (name of family member/significant other) has been identified by the patient as the family member/significant other with whom the patient will be residing, and identified as the person(s) who will aid the patient in the event of a mental health crisis (suicidal ideations/suicide attempt).  With written consent from the patient, the family member/significant other has been provided the following suicide prevention education, prior to the and/or following the discharge of the patient.  The suicide prevention education provided includes the following:  Suicide risk factors  Suicide prevention and interventions  National Suicide Hotline telephone number  [facility] assessment telephone number  [facility] Emergency Assistance 911  [facility] and/or Residential Mobile Crisis Unit telephone number  Request made of family/significant other to:  Remove weapons (e.g., guns, rifles, knives), all items previously/currently identified as safety concern.    Remove drugs/medications (over-the-counter, prescriptions, illicit drugs), all items previously/currently identified as a safety concern.  The family member/significant other verbalizes understanding of the suicide prevention education information provided.  The family member/significant other agrees to remove the items of safety concern listed above.  Khaiden Segreto, [place] 04/27/2015, 9:03 AM

## 2015-04-28 MED ORDER — TUBERCULIN PPD 5 UNIT/0.1ML ID SOLN
5.0000 [IU] | Freq: Once | INTRADERMAL | Status: AC
  Administered 2015-04-28: 5 [IU] via INTRADERMAL

## 2015-04-28 MED ORDER — LORAZEPAM 1 MG PO TABS
1.0000 mg | ORAL_TABLET | Freq: Four times a day (QID) | ORAL | Status: DC | PRN
  Administered 2015-04-28 – 2015-04-30 (×5): 1 mg via ORAL
  Filled 2015-04-28 (×5): qty 1

## 2015-04-28 MED ORDER — HYDROXYZINE HCL 25 MG PO TABS
25.0000 mg | ORAL_TABLET | Freq: Four times a day (QID) | ORAL | Status: DC | PRN
  Administered 2015-04-28 – 2015-04-30 (×2): 25 mg via ORAL
  Filled 2015-04-28 (×3): qty 1

## 2015-04-28 NOTE — [facility] Group Notes (Signed)
[facility] LCSW Group Therapy  04/28/2015 12:56 PM  Type of Therapy:  Group Therapy  Participation Level:  Active  Participation Quality:  Attentive  Affect:  Appropriate  Cognitive:  Alert and Oriented  Insight:  Improving  Engagement in Therapy:  Improving  Modes of Intervention:  Confrontation, Discussion, Education, Exploration, Problem-solving, Rapport Building, Socialization and Support  Summary of Progress/Problems: Today's Topic: Overcoming Obstacles. Patients identified one short term goal and potential obstacles in reaching this goal. Patients processed barriers involved in overcoming these obstacles. Patients identified steps necessary for overcoming these obstacles and explored motivation (internal and external) for facing these difficulties head on. Denise Sandoval was attentive and engaged during today's processing group. She shared that her interview went well. "my biggest obstacle is staying on track and not letting people get in my way. Denise Sandoval talked about her relationship with her child's father and her fear of being taken off psych meds in order to get into a christian based treatment center. She shows progress in the group setting with improving insight aeb her ability to problem solve with the group and with CSW to overcome these identified obstacles.   Smart, Denise Sandoval LCSWA 04/28/2015, 12:56 PM

## 2015-04-28 NOTE — Progress Notes (Signed)
Pt attended NA group this evening.  

## 2015-04-28 NOTE — Progress Notes (Signed)
Recreation Therapy Notes  Date: 09.07.2016 Time: 9:30am Location: [address]   Group Topic: Stress Management  Goal Area(s) Addresses:  Patient will actively participate in stress management techniques presented during session.   Behavioral Response: Appropriate    Intervention: Stress management techniques  Activity :  Deep Breathing and Progressive Muscle Relaxation. LRT provided instruction and demonstration on practice of Progressive Muscle Relaxation. Technique was coupled with deep breathing.   Education:  Stress Management, Discharge Planning.   Education Outcome: Acknowledges education  Clinical Observations/Feedback: Patient participated appropriately in techniques introduced during session, expressed no concerns and demonstrated ability to practice independently post d/c.    Marykay Lex Jaimarie Rapozo, LRT/CTRS        Joseff Luckman L 04/28/2015 2:55 PM

## 2015-04-28 NOTE — [facility] Group Notes (Signed)
[facility] LCSW Aftercare Discharge Planning Group Note   04/28/2015 11:00 AM  Participation Quality:  Appropriate   Mood/Affect:  Appropriate  Depression Rating:  6  Anxiety Rating:  8  Thoughts of Suicide:  No Will you contract for safety?   NA  Current AVH:  No  Plan for Discharge/Comments:  Pt reports that meds have been working well "Dr. Dub Mikes just increased my depression medication." Pt has phone interview with [facility] at 1:30PM and is aware that she is on [facility] waitlist (about 1 week out per [facility] Pt reports that she slept "okay."   Transportation Means: unknown at this time.   Supports: grandmother. Poor support: Boyfriend   Smart, Oncologist

## 2015-04-28 NOTE — Progress Notes (Signed)
Patient ID: Denise Sandoval, female   DOB: 05-28-1996, 19 y.o.   MRN: [id number]   Pt currently presents with a masked affect and anxious behavior. Per self inventory, pt rates depression at a 7, hopelessness 8 and anxiety 8. Pt's daily goal is to "call [facility] christ[facility] at 1:30 for interview" and they intend to do so by "pick up the phone." Pt reports good sleep, a fair appetite, low energy and poor concentration.   Pt provided with medications per providers orders. Pt's labs and vitals were monitored throughout the day. Pt supported emotionally and encouraged to express concerns and questions. Pt educated on medications.  Pt's safety ensured with 15 minute and environmental checks. Pt currently denies SI/HI and A/V hallucinations. Pt verbally agrees to seek staff if SI/HI or A/VH occurs and to consult with staff before acting on these thoughts. Pt comes to writer this afternoon smiling and excited due to "my grandma is going [place] Pt then comes to writer tearful complaining of anxiety "about changes." Pt voices that her one year old daughter is her motivation for recovery and going to rehab on Saturday. Will continue POC.

## 2015-04-28 NOTE — Progress Notes (Signed)
[facility] MD Progress Note  04/28/2015 3:35 PM Chris Narasimhan  MRN:  [id number] Subjective:  Denise Sandoval endorses that she is really working hard to get her life back together. States that her boyfriend keeps calling trying to get her to get back with him. He is braving her with giving her Suboxone. She states that she knows this would be the wrong thing to do. She wants to move forward. Still endorsing depression. Asked if the antidepressant can be changed. She is still wanting to go to a residential treatment program Principal Problem: Major depressive disorder, recurrent Diagnosis:   Patient Active Problem List   Diagnosis Date Noted  . Major depressive disorder [F32.2] 04/25/2015  . ETOH abuse [F10.10] 04/25/2015  . Major depressive disorder, recurrent [F33.9] 01/16/2015  . Polysubstance dependence including opioid drug with daily use [F19.20] 01/16/2015  . Post traumatic stress disorder (PTSD) [F43.10] 01/16/2015  . Postpartum depression [F53] 06/16/2014  . RLQ abdominal pain [R10.31] 05/13/2014  . Postpartum endometritis [O86.12] 05/13/2014  . Active labor at term [O80] 05/06/2014  . Chlamydia infection during pregnancy, antepartum [O98.319, A74.9] 04/22/2014  . Pyelonephritis affecting pregnancy in third trimester, antepartum [O23.03, N12] 02/28/2014  . Marijuana use [F12.10] 01/14/2014  . Rubella non-immune status, antepartum [O99.89, Z28.3] 01/14/2014  . Rh negative state in antepartum period [O36.0190] 01/14/2014  . TSS (toxic shock syndrome) [A48.3] 03/28/2012  . ARF (acute renal failure) [N17.9] 03/26/2012  . Fever of unknown origin [R50.9] 03/24/2012  . Tachycardia [R00.0] 03/24/2012  . Leukocytosis [D72.829] 03/24/2012  . Hypokalemia [E87.6] 03/24/2012  . Abnormal LFTs [R79.89] 03/24/2012   Total Time spent with patient: 30 minutes   Past Medical History:  Past Medical History  Diagnosis Date  . Supervision of normal first pregnancy in first trimester 09/23/2013  . Kidney  infection   . RLQ abdominal pain 05/13/2014  . Postpartum endometritis 05/13/2014  . Depression   . Anxiety     Past Surgical History  Procedure Laterality Date  . No past surgeries     Family History:  Family History  Problem Relation Age of Onset  . Alcohol abuse Mother   . Alcohol abuse Father   . Hypertension Maternal Grandmother   . Alcohol abuse Maternal Grandfather   . Alcohol abuse Paternal Grandfather    Social History:  History  Alcohol Use  . 100.8 oz/week  . 168 Cans of beer per week    Comment: daily     History  Drug Use  . 1.00 per week  . Special: Marijuana, Benzodiazepines, Hydrocodone    Comment: 4 days ago cocaine, 1 day ago marijuana, last pain x1 day ago, antianxiety x1 day ago    Social History   Social History  . Marital Status: Single    Spouse Name: N/A  . Number of Children: N/A  . Years of Education: N/A   Social History Main Topics  . Smoking status: Current Every Day Smoker -- 0.50 packs/day for 3 years    Types: Cigarettes  . Smokeless tobacco: Never Used  . Alcohol Use: 100.8 oz/week    168 Cans of beer per week     Comment: daily  . Drug Use: 1.00 per week    Special: Marijuana, Benzodiazepines, Hydrocodone     Comment: 4 days ago cocaine, 1 day ago marijuana, last pain x1 day ago, antianxiety x1 day ago  . Sexual Activity: Yes    Birth Control/ Protection: None, Condom   Other Topics Concern  . None   Social  History Narrative   Additional History:    Sleep: Fair  Appetite:  Fair   Assessment:   Musculoskeletal: Strength & Muscle Tone: within normal limits Gait & Station: normal Patient leans: normal   Psychiatric Specialty Exam: Physical Exam  Review of Systems  Constitutional: Negative.   HENT: Negative.   Eyes: Negative.   Respiratory: Negative.   Cardiovascular: Negative.   Gastrointestinal: Negative.   Genitourinary: Negative.   Musculoskeletal: Negative.   Skin: Negative.   Neurological: Negative.    Endo/Heme/Allergies: Negative.   Psychiatric/Behavioral: Positive for depression and substance abuse. The patient is nervous/anxious.     Blood pressure 117/74, pulse 122, temperature 98.2 F (36.8 C), temperature source Oral, resp. rate 16, height  (1.727 m), weight 67.132 kg (148 lb), last menstrual period 04/21/2015, not currently breastfeeding.Body mass index is 22.51 kg/(m^2).  General Appearance: Fairly Groomed  Patent attorney::  Fair  Speech:  Clear and Coherent  Volume:  Decreased  Mood:  Anxious and Depressed  Affect:  Appropriate  Thought Process:  Coherent and Goal Directed  Orientation:  Full (Time, Place, and Person)  Thought Content:  symptoms events worries concerns  Suicidal Thoughts:  No  Homicidal Thoughts:  No  Memory:  Immediate;   Fair Recent;   Fair Remote;   Fair  Judgement:  Fair  Insight:  Present  Psychomotor Activity:  Normal  Concentration:  Fair  Recall:  [employer] of Knowledge:Fair  Language: Fair  Akathisia:  No  Handed:  Right  AIMS (if indicated):     Assets:  Desire for Improvement Social Support  ADL's:  Intact  Cognition: WNL  Sleep:  Number of Hours: 5.75     Current Medications: Current Facility-Administered Medications  Medication Dose Route Frequency Provider Last Rate Last Dose  . alum & mag hydroxide-simeth (MAALOX/MYLANTA) 200-200-20 MG/5ML suspension 30 mL  30 mL Oral Q4H PRN Charm Rings, NP      . busPIRone (BUSPAR) tablet 15 mg  15 mg Oral TID Rachael Fee, MD   15 mg at 04/28/15 1200  . gabapentin (NEURONTIN) tablet 800 mg  800 mg Oral TID Sanjuana Kava, NP   800 mg at 04/28/15 1207  . hydrOXYzine (ATARAX/VISTARIL) tablet 25 mg  25 mg Oral Q6H PRN Rachael Fee, MD   25 mg at 04/28/15 1426  . ibuprofen (ADVIL,MOTRIN) tablet 600 mg  600 mg Oral Q6H PRN Charm Rings, NP   600 mg at 04/27/15 0805  . LORazepam (ATIVAN) tablet 1 mg  1 mg Oral Q6H PRN Rachael Fee, MD      . magnesium hydroxide (MILK OF MAGNESIA)  suspension 30 mL  30 mL Oral Daily PRN Charm Rings, NP      . multivitamin with minerals tablet 1 tablet  1 tablet Oral Daily Charm Rings, NP   1 tablet at 04/28/15 [phone]  . nicotine (NICODERM CQ - dosed in mg/24 hours) patch 21 mg  21 mg Transdermal Q0600 Charm Rings, NP   21 mg at 04/28/15 0835  . prazosin (MINIPRESS) capsule 2 mg  2 mg Oral QHS Rachael Fee, MD   2 mg at 04/27/15 2139  . sertraline (ZOLOFT) tablet 150 mg  150 mg Oral Daily Rachael Fee, MD   150 mg at 04/28/15 0834  . thiamine (VITAMIN B-1) tablet 100 mg  100 mg Oral Daily Charm Rings, NP   100 mg at 04/28/15 0834  . traZODone (  DESYREL) tablet 100 mg  100 mg Oral QHS Sanjuana Kava, NP   100 mg at 04/27/15 2140  . tuberculin injection 5 Units  5 Units Intradermal Once Rachael Fee, MD   5 Units at 04/28/15 1507    Lab Results: No results found for this or any previous visit (from the past 48 hour(s)).  Physical Findings: AIMS: Facial and Oral Movements Muscles of Facial Expression: None, normal Lips and Perioral Area: None, normal Jaw: None, normal Tongue: None, normal,Extremity Movements Upper (arms, wrists, hands, fingers): None, normal Lower (legs, knees, ankles, toes): None, normal, Trunk Movements Neck, shoulders, hips: None, normal, Overall Severity Severity of abnormal movements (highest score from questions above): None, normal Incapacitation due to abnormal movements: None, normal Patient's awareness of abnormal movements (rate only patient's report): No Awareness, Dental Status Current problems with teeth and/or dentures?: No Does patient usually wear dentures?: No  CIWA:  CIWA-Ar Total: 3 COWS:  COWS Total Score: 9  Treatment Plan Summary: Daily contact with patient to assess and evaluate symptoms and progress in treatment and Medication management Supportive approach/coping skills Depression/PTSD: will increase the Zoloft to 100 mg daily. We discussed and agreed that the best option was to  increase the dose of the Zoloft rather than changing it altogether as a new antidepressant will take 6 weeks to work if it was going to work al all Anxiety; she states that Neurontin has help a lot and asked if it could be increased Substance abuse; will continue to work a relapse prevention plan CBT/mindfulness  Note; she was able to contact a program she could go to [employer]) and she will have to come off a lot of her medications what she is hesitant to do They asked for a Tb test, Hep C, STD's HIV tests; will go ahead and comply with this request  Medical Decision Making:  Review of Psycho-Social Stressors (1), Review or order clinical lab tests (1), Review of Medication Regimen & Side Effects (2) and Review of New Medication or Change in Dosage (2)     Shareeka Yim A 04/28/2015, 3:35 PM

## 2015-04-29 DIAGNOSIS — F419 Anxiety disorder, unspecified: Secondary | ICD-10-CM

## 2015-04-29 DIAGNOSIS — F339 Major depressive disorder, recurrent, unspecified: Secondary | ICD-10-CM

## 2015-04-29 LAB — GC/CHLAMYDIA PROBE AMP (~~LOC~~) NOT AT ARMC
Chlamydia: NEGATIVE
Neisseria Gonorrhea: NEGATIVE

## 2015-04-29 LAB — HIV ANTIBODY (ROUTINE TESTING W REFLEX): HIV SCREEN 4TH GENERATION: NONREACTIVE

## 2015-04-29 LAB — RPR: RPR: NONREACTIVE

## 2015-04-29 MED ORDER — PRAZOSIN HCL 1 MG PO CAPS
1.0000 mg | ORAL_CAPSULE | Freq: Every day | ORAL | Status: DC
  Filled 2015-04-29 (×2): qty 1

## 2015-04-29 MED ORDER — TRAZODONE HCL 50 MG PO TABS
75.0000 mg | ORAL_TABLET | Freq: Every day | ORAL | Status: DC
  Administered 2015-04-29 – 2015-04-30 (×2): 75 mg via ORAL
  Filled 2015-04-29 (×3): qty 1

## 2015-04-29 NOTE — [facility] Group Notes (Signed)
The focus of this group is to educate the patient on the purpose and policies of crisis stabilization and provide a format to answer questions about their admission.  The group details unit policies and expectations of patients while admitted.  Patient did not attend 0900 nurse education orientation group this morning.  Patient stayed in bed.   

## 2015-04-29 NOTE — [facility] Group Notes (Signed)
[facility] LCSW Group Therapy  04/29/2015 2:25 PM  Type of Therapy:  Group Therapy  Participation Level:  Active  Participation Quality:  Attentive  Affect:  Appropriate  Cognitive:  Alert and Oriented  Insight:  Improving  Engagement in Therapy:  Engaged  Modes of Intervention:  Confrontation, Discussion, Education, Exploration, Problem-solving, Rapport Building, Socialization and Support  Summary of Progress/Problems:  Finding Balance in Life. Today's group focused on defining balance in one's own words, identifying things that can knock one off balance, and exploring healthy ways to maintain balance in life. Group members were asked to provide an example of a time when they felt off balance, describe how they handled that situation,and process healthier ways to regain balance in the future. Group members were asked to share the most important tool for maintaining balance that they learned while at [facility] and how they plan to apply this method after discharge. Denise Sandoval was attentive and engaged during today's processing group. She shared that she is feeling more balanced and hopeful "now that I got into treatment in [facility]." Denise Sandoval shared that she is thankful for having a supportive grandmother willing to pay for her treatment. She stated that eliminating her exbf was the first step to improving balance in her life. "I am moving toward my goal of being with my daughter and being healthy."   Smart, Burdett Pinzon LCSWA  04/29/2015, 2:25 PM

## 2015-04-29 NOTE — Progress Notes (Addendum)
Patient ID: Denise Sandoval, female   DOB: 07/15/1996, 19 y.o.   MRN: [id number]  Pt currently presents with an appropriate affect and anxious behavior. Per self inventory, pt rates depression at a 5, hopelessness 5 and anxiety 8. Pt's daily goal is to "figure out why my blood pressure is low" and they intend to do so by "talk to doctor." Pt reports good sleep, a fair appetite, low energy and poor concentration. Pt came to writer tearful about "my boyfriend is manipulative, he says I'm abandoning my daughter and that I am a bad mother." Pt blood pressure remains low today.   Pt provided with medications per providers orders. Pt's labs and vitals were monitored throughout the day. Pt supported emotionally and encouraged to express concerns and questions. Pt educated on medications. Pt given a 1:1 about recovery and long term goals. Pt expresses verbal understanding and states "thanks, I need to hear that sometimes." Per MD verbal orders, push fluids and discontinue mini press and continue to monitor BP. Per MD high fall risk orders implemented, pt educated, verbal understanding expressed.  Pt's safety ensured with 15 minute and environmental checks. Pt currently denies SI/HI and A/V hallucinations. Pt verbally agrees to seek staff if SI/HI or A/VH occurs and to consult with staff before acting on these thoughts. Pt hopeful about leaving for rehab tomorrow. Will continue POC.

## 2015-04-29 NOTE — Clinical Social Work Note (Signed)
CSW spoke with Nicholos Johns at [facility] (admissions/intake coordinator). Pt has been accepted and per Dr. Dub Mikes, can be discharged on Saturday in order to go to this facility. She will need prescription for 90 days per Nicholos Johns. Pt is still on [facility] waitlist and has been instructed to call Shayla if she wishes to remain on waitlist after discharge. CSW left message with pt's grandmother to confirm discharge plan and requested call back at her earliest convenience.  Trula Slade, LCSWA Clinical Social Worker 04/29/2015 10:36 AM

## 2015-04-29 NOTE — Clinical Social Work Note (Signed)
Per pt request, referral sent to: [facility]. CSW left message for admission requesting call back once received/reviewed.   Trula Slade, [employer] Clinical Social Worker 04/29/2015 8:28 AM

## 2015-04-29 NOTE — Progress Notes (Signed)
Patient ID: Denise Sandoval, female   DOB: September 27, 1995, 19 y.o.   MRN: [id number] [facility] MD Progress Note  04/29/2015 3:05 PM Denise Sandoval  MRN:  [id number] Subjective:  Tane was seen today interacting with others in the day room.  She is looking forward to discharge and beginning her outpatient therapy.  She is asking if she will get her Buspar.  Pre-auth form for Medicaid completed and sent to [facility] in [place].  She did not mention today although earlier, she stated that her boyfriend keeps calling trying to get her to get back with him to include using substances again.    Principal Problem: Major depressive disorder, recurrent Diagnosis:   Patient Active Problem List   Diagnosis Date Noted  . Major depressive disorder [F32.2] 04/25/2015  . ETOH abuse [F10.10] 04/25/2015  . Major depressive disorder, recurrent [F33.9] 01/16/2015  . Polysubstance dependence including opioid drug with daily use [F19.20] 01/16/2015  . Post traumatic stress disorder (PTSD) [F43.10] 01/16/2015  . Postpartum depression [F53] 06/16/2014  . RLQ abdominal pain [R10.31] 05/13/2014  . Postpartum endometritis [O86.12] 05/13/2014  . Active labor at term [O80] 05/06/2014  . Chlamydia infection during pregnancy, antepartum [O98.319, A74.9] 04/22/2014  . Pyelonephritis affecting pregnancy in third trimester, antepartum [O23.03, N12] 02/28/2014  . Marijuana use [F12.10] 01/14/2014  . Rubella non-immune status, antepartum [O99.89, Z28.3] 01/14/2014  . Rh negative state in antepartum period [O36.0190] 01/14/2014  . TSS (toxic shock syndrome) [A48.3] 03/28/2012  . ARF (acute renal failure) [N17.9] 03/26/2012  . Fever of unknown origin [R50.9] 03/24/2012  . Tachycardia [R00.0] 03/24/2012  . Leukocytosis [D72.829] 03/24/2012  . Hypokalemia [E87.6] 03/24/2012  . Abnormal LFTs [R79.89] 03/24/2012   Total Time spent with patient: 30 minutes   Past Medical History:  Past Medical History  Diagnosis Date  .  Supervision of normal first pregnancy in first trimester 09/23/2013  . Kidney infection   . RLQ abdominal pain 05/13/2014  . Postpartum endometritis 05/13/2014  . Depression   . Anxiety     Past Surgical History  Procedure Laterality Date  . No past surgeries     Family History:  Family History  Problem Relation Age of Onset  . Alcohol abuse Mother   . Alcohol abuse Father   . Hypertension Maternal Grandmother   . Alcohol abuse Maternal Grandfather   . Alcohol abuse Paternal Grandfather    Social History:  History  Alcohol Use  . 100.8 oz/week  . 168 Cans of beer per week    Comment: daily     History  Drug Use  . 1.00 per week  . Special: Marijuana, Benzodiazepines, Hydrocodone    Comment: 4 days ago cocaine, 1 day ago marijuana, last pain x1 day ago, antianxiety x1 day ago    Social History   Social History  . Marital Status: Single    Spouse Name: N/A  . Number of Children: N/A  . Years of Education: N/A   Social History Main Topics  . Smoking status: Current Every Day Smoker -- 0.50 packs/day for 3 years    Types: Cigarettes  . Smokeless tobacco: Never Used  . Alcohol Use: 100.8 oz/week    168 Cans of beer per week     Comment: daily  . Drug Use: 1.00 per week    Special: Marijuana, Benzodiazepines, Hydrocodone     Comment: 4 days ago cocaine, 1 day ago marijuana, last pain x1 day ago, antianxiety x1 day ago  . Sexual Activity: Yes  Birth Control/ Protection: None, Condom   Other Topics Concern  . None   Social History Narrative   Additional History:    Sleep: Fair  Appetite:  Fair   Assessment:   Musculoskeletal: Strength & Muscle Tone: within normal limits Gait & Station: normal Patient leans: normal   Psychiatric Specialty Exam: Physical Exam  Review of Systems  Constitutional: Negative.   HENT: Negative.   Eyes: Negative.   Respiratory: Negative.   Cardiovascular: Negative.   Gastrointestinal: Negative.   Genitourinary:  Negative.   Musculoskeletal: Negative.   Skin: Negative.   Neurological: Negative.   Endo/Heme/Allergies: Negative.   Psychiatric/Behavioral: Positive for depression and substance abuse. The patient is nervous/anxious.     Blood pressure 105/53, pulse 99, temperature 98 F (36.7 C), temperature source Oral, resp. rate 20, height 5\' 8"  (1.727 m), weight 67.132 kg (148 lb), last menstrual period 04/21/2015, not currently breastfeeding.Body mass index is 22.51 kg/(m^2).  General Appearance: Fairly Groomed  Patent attorney::  Fair  Speech:  Clear and Coherent  Volume:  Decreased  Mood:  Anxious but mood is improving and she is future hopeful  Affect:  Appropriate  Thought Process:  Coherent and Goal Directed  Orientation:  Full (Time, Place, and Person)  Thought Content:  symptoms events worries concerns  Suicidal Thoughts:  No  Homicidal Thoughts:  No  Memory:  Immediate;   Fair Recent;   Fair Remote;   Fair  Judgement:  Fair  Insight:  Present  Psychomotor Activity:  Normal  Concentration:  Fair  Recall:  [employer] of Knowledge:Fair  Language: Fair  Akathisia:  No  Handed:  Right  AIMS (if indicated):     Assets:  Desire for Improvement Social Support  ADL's:  Intact  Cognition: WNL  Sleep:  Number of Hours: 5.75     Current Medications: Current Facility-Administered Medications  Medication Dose Route Frequency Provider Last Rate Last Dose  . alum & mag hydroxide-simeth (MAALOX/MYLANTA) 200-200-20 MG/5ML suspension 30 mL  30 mL Oral Q4H PRN Charm Rings, NP      . busPIRone (BUSPAR) tablet 15 mg  15 mg Oral TID Rachael Fee, MD   15 mg at 04/29/15 1209  . gabapentin (NEURONTIN) tablet 800 mg  800 mg Oral TID Sanjuana Kava, NP   800 mg at 04/29/15 1209  . hydrOXYzine (ATARAX/VISTARIL) tablet 25 mg  25 mg Oral Q6H PRN Rachael Fee, MD   25 mg at 04/28/15 1426  . ibuprofen (ADVIL,MOTRIN) tablet 600 mg  600 mg Oral Q6H PRN Charm Rings, NP   600 mg at 04/27/15 0805  .  LORazepam (ATIVAN) tablet 1 mg  1 mg Oral Q6H PRN Rachael Fee, MD   1 mg at 04/29/15 1455  . magnesium hydroxide (MILK OF MAGNESIA) suspension 30 mL  30 mL Oral Daily PRN Charm Rings, NP      . multivitamin with minerals tablet 1 tablet  1 tablet Oral Daily Charm Rings, NP   1 tablet at 04/29/15 [phone]  . nicotine (NICODERM CQ - dosed in mg/24 hours) patch 21 mg  21 mg Transdermal Q0600 Charm Rings, NP   21 mg at 04/29/15 1191  . prazosin (MINIPRESS) capsule 1 mg  1 mg Oral QHS Adonis Brook, NP      . sertraline (ZOLOFT) tablet 150 mg  150 mg Oral Daily Rachael Fee, MD   150 mg at 04/29/15 0834  . thiamine (VITAMIN B-1)  tablet 100 mg  100 mg Oral Daily Charm Rings, NP   100 mg at 04/29/15 0834  . traZODone (DESYREL) tablet 75 mg  75 mg Oral QHS Adonis Brook, NP      . tuberculin injection 5 Units  5 Units Intradermal Once Rachael Fee, MD   5 Units at 04/28/15 1507    Lab Results:  Results for orders placed or performed during the hospital encounter of 04/25/15 (from the past 48 hour(s))  HIV antibody     Status: None   Collection Time: 04/28/15  7:18 PM  Result Value Ref Range   HIV Screen 4th Generation wRfx Non Reactive Non Reactive    Comment: (NOTE) Performed At: [facility] [address] [place], [place] [id number] Mila Homer MD WU:[id number] Performed at [facility]     Physical Findings: AIMS: Facial and Oral Movements Muscles of Facial Expression: None, normal Lips and Perioral Area: None, normal Jaw: None, normal Tongue: None, normal,Extremity Movements Upper (arms, wrists, hands, fingers): None, normal Lower (legs, knees, ankles, toes): None, normal, Trunk Movements Neck, shoulders, hips: None, normal, Overall Severity Severity of abnormal movements (highest score from questions above): None, normal Incapacitation due to abnormal movements: None, normal Patient's awareness of abnormal movements (rate only patient's  report): No Awareness, Dental Status Current problems with teeth and/or dentures?: No Does patient usually wear dentures?: No  CIWA:  CIWA-Ar Total: 3 COWS:  COWS Total Score: 9  Treatment Plan Summary: Daily contact with patient to assess and evaluate symptoms and progress in treatment and Medication management Supportive approach/coping skills Depression/PTSD: will increase the Zoloft to 100 mg daily. We discussed and agreed that the best option was to increase the dose of the Zoloft rather than changing it altogether as a new antidepressant will take 6 weeks to work if it was going to work al all Anxiety; she states that Neurontin has help a lot and asked if it could be increased Substance abuse; will continue to work a relapse prevention plan CBT/mindfulness  Note; she was able to contact a program she could go to [employer]) and she will have to come off a lot of her medications what she is hesitant to do They asked for a Tb test, Hep C, STD's HIV tests; will go ahead and comply with this request  Medical Decision Making:  Review of Psycho-Social Stressors (1), Review or order clinical lab tests (1), Review of Medication Regimen & Side Effects (2) and Review of New Medication or Change in Dosage (2)  Velna Hatchet May Agustin AGNP-BC 04/29/2015, 3:05 PM I agree with assessment and plan Madie Reno A. Dub Mikes, M.D.

## 2015-04-29 NOTE — Progress Notes (Signed)
D: Pt reports anxiety related to going to a 90-day rehab program in [facility]. However, pt acknowledges the need for her to attend such a problem. Pt was visible and active within the milieu. Pt denied any suicidal ideation.  A: Writer administered scheduled and prn medications to pt, per MD orders. Continued support and availability as needed was extended to this pt. Staff continue to monitor pt with q59min checks.  R: No adverse drug reactions noted. Pt receptive to treatment. Pt remains safe at this time.

## 2015-04-30 DIAGNOSIS — F1390 Sedative, hypnotic, or anxiolytic use, unspecified, uncomplicated: Secondary | ICD-10-CM

## 2015-04-30 DIAGNOSIS — F1020 Alcohol dependence, uncomplicated: Secondary | ICD-10-CM | POA: Diagnosis present

## 2015-04-30 DIAGNOSIS — F332 Major depressive disorder, recurrent severe without psychotic features: Principal | ICD-10-CM

## 2015-04-30 DIAGNOSIS — F4310 Post-traumatic stress disorder, unspecified: Secondary | ICD-10-CM

## 2015-04-30 DIAGNOSIS — F1320 Sedative, hypnotic or anxiolytic dependence, uncomplicated: Secondary | ICD-10-CM | POA: Diagnosis present

## 2015-04-30 DIAGNOSIS — F1120 Opioid dependence, uncomplicated: Secondary | ICD-10-CM | POA: Diagnosis present

## 2015-04-30 LAB — HEPATITIS PANEL, ACUTE
HCV Ab: <0.1 s/co ratio (ref 0.0–0.9)
HEP A IGM: NEGATIVE
HEP B C IGM: NEGATIVE
HEP B S AG: NEGATIVE

## 2015-04-30 MED ORDER — GABAPENTIN 800 MG PO TABS: 800.0000 mg | ORAL_TABLET | Freq: Three times a day (TID) | ORAL | Status: DC

## 2015-04-30 MED ORDER — SERTRALINE HCL 50 MG PO TABS: 150.0000 mg | ORAL_TABLET | Freq: Every day | ORAL | Status: DC

## 2015-04-30 MED ORDER — HYDROXYZINE HCL 50 MG PO TABS: 50.0000 mg | ORAL_TABLET | Freq: Two times a day (BID) | ORAL | Status: DC | PRN

## 2015-04-30 MED ORDER — NICOTINE 21 MG/24HR TD PT24: 21.0000 mg | MEDICATED_PATCH | Freq: Every day | TRANSDERMAL | Status: DC

## 2015-04-30 MED ORDER — HYDROXYZINE HCL 50 MG PO TABS
50.0000 mg | ORAL_TABLET | Freq: Two times a day (BID) | ORAL | Status: DC | PRN
  Administered 2015-04-30: 50 mg via ORAL
  Filled 2015-04-30: qty 1

## 2015-04-30 MED ORDER — TRAZODONE HCL 150 MG PO TABS: 75.0000 mg | ORAL_TABLET | Freq: Every day | ORAL | Status: DC

## 2015-04-30 MED ORDER — BUSPIRONE HCL 15 MG PO TABS: 15.0000 mg | ORAL_TABLET | Freq: Three times a day (TID) | ORAL | Status: DC

## 2015-04-30 NOTE — Progress Notes (Addendum)
Patient ID: Nakaila Freeze, female   DOB: Jan 19, 1996, 19 y.o.   MRN: [id number] [facility] MD Progress Note  04/30/2015 11:47 AM Shakeita Vandevander  MRN:  [id number] Subjective:  Keyon states ' I am anxious , I am not sure if my medication is working for me. "  Objective: Patient seen and chart reviewed.Discussed patient with treatment team.  Pt today appears anxious , has good eye contact, is alert, reports increased anxiety , Buspar as not being effective. Pt reports sleep as improved, appetite as fair. Pt had presented with worsening depression , SI ,crying spells -which seems to be improving on current medication regimen. Pt otherwise denies any new concerns . Pt if stable will be discharged tomorrow - she agrees with plan.     Principal Problem: MDD (major depressive disorder), recurrent severe, without psychosis Diagnosis:   Patient Active Problem List   Diagnosis Date Noted  . MDD (major depressive disorder), recurrent severe, without psychosis [F33.2] 04/30/2015  . Alcohol use disorder, moderate, dependence [F10.20] 04/30/2015  . Opioid use disorder, moderate, dependence [F11.20] 04/30/2015  . 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 Major depressive disorder [F32.2] 04/25/2015  . Polysubstance dependence including opioid drug with daily use [F19.20] 01/16/2015  . Post traumatic stress disorder (PTSD) [F43.10] 01/16/2015  . Postpartum depression [F53] 06/16/2014  . RLQ abdominal pain [R10.31] 05/13/2014  . Postpartum endometritis [O86.12] 05/13/2014  . Active labor at term [O80] 05/06/2014  . Chlamydia infection during pregnancy, antepartum [O98.319, A74.9] 04/22/2014  . Pyelonephritis affecting pregnancy in third trimester, antepartum [O23.03, N12] 02/28/2014  . Marijuana use [F12.10] 01/14/2014  . Rubella non-immune status, antepartum [O99.89, Z28.3] 01/14/2014  . Rh negative state in antepartum period [O36.0190] 01/14/2014  . TSS (toxic shock syndrome) [A48.3]  03/28/2012  . ARF (acute renal failure) [N17.9] 03/26/2012  . Fever of unknown origin [R50.9] 03/24/2012  . Tachycardia [R00.0] 03/24/2012  . Leukocytosis [D72.829] 03/24/2012  . Hypokalemia [E87.6] 03/24/2012  . Abnormal LFTs [R79.89] 03/24/2012   Total Time spent with patient: 30 minutes   Past Medical History:  Past Medical History  Diagnosis Date  . Supervision of normal first pregnancy in first trimester 09/23/2013  . Kidney infection   . RLQ abdominal pain 05/13/2014  . Postpartum endometritis 05/13/2014  . Depression   . Anxiety     Past Surgical History  Procedure Laterality Date  . No past surgeries     Family History:  Family History  Problem Relation Age of Onset  . Alcohol abuse Mother   . Alcohol abuse Father   . Hypertension Maternal Grandmother   . Alcohol abuse Maternal Grandfather   . Alcohol abuse Paternal Grandfather    Social History:  History  Alcohol Use  . 100.8 oz/week  . 168 Cans of beer per week    Comment: daily     History  Drug Use  . 1.00 per week  . Special: Marijuana, Benzodiazepines, Hydrocodone    Comment: 4 days ago cocaine, 1 day ago marijuana, last pain x1 day ago, antianxiety x1 day ago    Social History   Social History  . Marital Status: Single    Spouse Name: N/A  . Number of Children: N/A  . Years of Education: N/A   Social History Main Topics  . Smoking status: Current Every Day Smoker -- 0.50 packs/day for 3 years    Types: Cigarettes  . Smokeless tobacco: Never Used  . Alcohol Use: 100.8 oz/week    168 Cans of beer  per week     Comment: daily  . Drug Use: 1.00 per week    Special: Marijuana, Benzodiazepines, Hydrocodone     Comment: 4 days ago cocaine, 1 day ago marijuana, last pain x1 day ago, antianxiety x1 day ago  . Sexual Activity: Yes    Birth Control/ Protection: None, Condom   Other Topics Concern  . None   Social History Narrative   Additional History:    Sleep: Fair  Appetite:   Fair    Musculoskeletal: Strength & Muscle Tone: within normal limits Gait & Station: normal Patient leans: normal   Psychiatric Specialty Exam: Physical Exam  Review of Systems  Constitutional: Negative.   HENT: Negative.   Eyes: Negative.   Respiratory: Negative.   Cardiovascular: Negative.   Gastrointestinal: Negative.   Genitourinary: Negative.   Musculoskeletal: Negative.   Skin: Negative.   Neurological: Negative.   Endo/Heme/Allergies: Negative.   Psychiatric/Behavioral: Positive for depression and substance abuse. The patient is nervous/anxious.   All other systems reviewed and are negative.   Blood pressure 81/46, pulse 137, temperature 97.3 F (36.3 C), temperature source Oral, resp. rate 16, height 5\' 8"  (1.727 m), weight 67.132 kg (148 lb), last menstrual period 04/21/2015, not currently breastfeeding.Body mass index is 22.51 kg/(m^2).  General Appearance: Fairly Groomed  Patent attorney::  Fair  Speech:  Clear and Coherent  Volume:  Decreased  Mood:  Anxious   Affect:  Appropriate  Thought Process:  Coherent and Goal Directed  Orientation:  Full (Time, Place, and Person)  Thought Content:  symptoms events worries concerns  Suicidal Thoughts:  No  Homicidal Thoughts:  No  Memory:  Immediate;   Fair Recent;   Fair Remote;   Fair  Judgement:  Fair  Insight:  Present  Psychomotor Activity:  Normal  Concentration:  Fair  Recall:  [employer] of Knowledge:Fair  Language: Fair  Akathisia:  No  Handed:  Right  AIMS (if indicated):     Assets:  Desire for Improvement Social Support  ADL's:  Intact  Cognition: WNL  Sleep:  Number of Hours: 5.75     Current Medications: Current Facility-Administered Medications  Medication Dose Route Frequency Provider Last Rate Last Dose  . alum & mag hydroxide-simeth (MAALOX/MYLANTA) 200-200-20 MG/5ML suspension 30 mL  30 mL Oral Q4H PRN Charm Rings, NP      . busPIRone (BUSPAR) tablet 15 mg  15 mg Oral TID Rachael Fee, MD   15 mg at 04/30/15 0803  . gabapentin (NEURONTIN) tablet 800 mg  800 mg Oral TID Sanjuana Kava, NP   800 mg at 04/30/15 0800  . hydrOXYzine (ATARAX/VISTARIL) tablet 50 mg  50 mg Oral BID PRN Jomarie Longs, MD      . ibuprofen (ADVIL,MOTRIN) tablet 600 mg  600 mg Oral Q6H PRN Charm Rings, NP   600 mg at 04/27/15 0805  . LORazepam (ATIVAN) tablet 1 mg  1 mg Oral Q6H PRN Rachael Fee, MD   1 mg at 04/29/15 2128  . magnesium hydroxide (MILK OF MAGNESIA) suspension 30 mL  30 mL Oral Daily PRN Charm Rings, NP      . multivitamin with minerals tablet 1 tablet  1 tablet Oral Daily Charm Rings, NP   1 tablet at 04/30/15 [phone]  . nicotine (NICODERM CQ - dosed in mg/24 hours) patch 21 mg  21 mg Transdermal Q0600 Charm Rings, NP   21 mg at 04/30/15 0802  .  sertraline (ZOLOFT) tablet 150 mg  150 mg Oral Daily Rachael Fee, MD   150 mg at 04/30/15 0803  . thiamine (VITAMIN B-1) tablet 100 mg  100 mg Oral Daily Charm Rings, NP   100 mg at 04/30/15 0803  . traZODone (DESYREL) tablet 75 mg  75 mg Oral QHS Adonis Brook, NP   75 mg at 04/29/15 2125  . tuberculin injection 5 Units  5 Units Intradermal Once Rachael Fee, MD   5 Units at 04/28/15 1507    Lab Results:  Results for orders placed or performed during the hospital encounter of 04/25/15 (from the past 48 hour(s))  HIV antibody     Status: None   Collection Time: 04/28/15  7:18 PM  Result Value Ref Range   HIV Screen 4th Generation wRfx Non Reactive Non Reactive    Comment: (NOTE) Performed At: [facility] [address] [place], [place] [id number] Mila Homer MD [id number] Performed at [facility]   Hepatitis panel, acute     Status: None   Collection Time: 04/28/15  7:18 PM  Result Value Ref Range   Hepatitis B Surface Ag Negative Negative   HCV Ab <0.1 0.0 - 0.9 s/co ratio    Comment: (NOTE)                                  Negative:     < 0.8                              Indeterminate: 0.8 - 0.9                                  Positive:     > 0.9 The CDC recommends that a positive HCV antibody result be followed up with a HCV Nucleic Acid Amplification test (578469). Performed At: [facility] [address] [place], [place] [id number] Mila Homer MD KG:[id number]    Hep A IgM Negative Negative   Hep B C IgM Negative Negative    Comment: Performed at [facility]  RPR     Status: None   Collection Time: 04/28/15  7:18 PM  Result Value Ref Range   RPR Ser Ql Non Reactive Non Reactive    Comment: (NOTE) Performed At: [facility] [address] [place], [place] [id number] Mila Homer MD [id number] Performed at [facility]     Physical Findings: AIMS: Facial and Oral Movements Muscles of Facial Expression: None, normal Lips and Perioral Area: None, normal Jaw: None, normal Tongue: None, normal,Extremity Movements Upper (arms, wrists, hands, fingers): None, normal Lower (legs, knees, ankles, toes): None, normal, Trunk Movements Neck, shoulders, hips: None, normal, Overall Severity Severity of abnormal movements (highest score from questions above): None, normal Incapacitation due to abnormal movements: None, normal Patient's awareness of abnormal movements (rate only patient's report): No Awareness, Dental Status Current problems with teeth and/or dentures?: No Does patient usually wear dentures?: No  CIWA:  CIWA-Ar Total: 1 COWS:  COWS Total Score: 9   Assessment : Patient presented with worsening affective sx as well as substance abuse , continues to improve on current medication regimen, however has anxiety sx - discussed increasing her medications to address that.  Treatment Plan Summary: Daily  contact with patient to assess and evaluate symptoms and progress in treatment and Medication management Supportive approach/coping skills Depression/PTSD: Continue  Zoloft to 150  mg daily. Anxiety; Continue Neurontin 800 mg po tid , Buspar 15 mg po tid , Increase Vistaril to 50 mg po bid prn for breakthrough anxiety sx. Insomnia : Trazodone 75 mg po qhs for sleep. Substance abuse; will continue to work a relapse prevention plan CBT/mindfulness  Reviewed  Labs - Hepatitis acute panel - non reactive , RPR- non reactive, HIV - non reactive . Patient to be discharged tomorrow if she continues to be stable .  Medical Decision Making:  Review of Psycho-Social Stressors (1), Review or order clinical lab tests (1), Review of Last Therapy Session (1), Review of Medication Regimen & Side Effects (2) and Review of New Medication or Change in Dosage (2)  Tresia Revolorio MD 04/30/2015, 11:47 AM

## 2015-04-30 NOTE — Progress Notes (Signed)
Pt stated that she slept until this afternoon because she wasn't feeling well. Pt is looking forward to going to [place] for a 90 day treatment program on Saturday.

## 2015-04-30 NOTE — Tx Team (Signed)
Interdisciplinary Treatment Plan Update (Adult)  Date:  04/30/2015  Time Reviewed:  10:27 AM   Progress in Treatment: Attending groups: Yes. Participating in groups:  Yes. Taking medication as prescribed:  Yes. Tolerating medication:  Yes. Family/Significant othe contact made:  SPE completed with pt's grandmother. Patient understands diagnosis:  Yes. and As evidenced by:  seeking treatment for alcohol, opiate, benzo abuse, SI, and depression. Discussing patient identified problems/goals with staff:  Yes. Medical problems stabilized or resolved:  Yes. Denies suicidal/homicidal ideation: Yes.  Discharge Plan or Barriers: Pt accepted to [facility] in [facility] for Saturday 9/10. Pt's grandmother will pick her up at [place] on Saturday. 90 day RX needed and per Maye NP, should have been sent to pt's pharmacy in [place] for her grandmother to pick up on Friday. Pt is also on [facility] waitlist.   Reason for Continuation of Hospitalization: Medication management   Comments:  Denise Sandoval, 19 y.o. female, single initially was seen at [facility]. She was accompanied by her mother. Reportedly, patient was abusing alcohol, Xanax, snorting Vicodin for the last 2.5 years. She states a history of depression. She admits that use of alcohol was very recent and is the result of being unable to get a hold of opiates which is her substance of choice. She was seen today and she stated that her emotional trigger was that her one year old child was taken into DSS custody one month ago because Pt was unable to stop using substances Pt states she feels her situation is hopeless and is having suicidal thoughts with plan to overdose. Pt states she has intentionally overdosed twice in the past in suicide attempts. She was hospitalized in [place] facilities. Pt states she has a history of depression and recently has experienced symptoms including crying spells, decreased sleep, decreased appetite with weight loss, social  withdrawal, loss of interest in usual pleasures, irritability and feelings of hopelessness. She denies any history of intentional self-injurious behaviors but states she is impulsive and once jumped from 30-feet off a deck after an argument. Pt denies homicidal ideation or history of violence. Pt denies history of auditory or visual hallucinations.Pt reports she is currently receiving outpatient therapy with Jake Michaelis with [facility]. She states she really would like to get some help. She would like to be a good mother to her child.    Estimated length of stay:  1 day (saturday at [facility] discharge). Pt must be completed by Friday (SRA/AVS). MD/staff have been notified on early d/c during treatment team.   Additional Comments:  Patient and CSW reviewed pt's identified goals and treatment plan. Patient verbalized understanding and agreed to treatment plan. CSW reviewed [facility] "Discharge Process and Patient Involvement" Form. Pt verbalized understanding of information provided and signed form.    Review of initial/current patient goals per problem list:  1. Goal(s): Patient will participate in aftercare plan  Met:Yes   Target date: at discharge  As evidenced by: Patient will participate within aftercare plan AEB aftercare provider and housing plan at discharge being identified.  9/6: [facility] referral pending. Pt calling [facility], given [facility] application. CSW assessing.   04/30/2015 Pt has been accepted to [facility] in [facility] for Saturday. Pt's grandmother will pay for treatment and transport pt on Saturday.   2. Goal (s): Patient will exhibit decreased depressive symptoms and suicidal ideations.  Met: yes    Target date: at discharge  As evidenced by: Patient will utilize self rating of depression at 3  or below and demonstrate decreased signs of depression or be deemed stable for discharge by MD.  9/6: Pt rates depression as high. Denies SI/HI/AVh today.   04/30/2015 Pt  rates depression as low. Denies Si/Hi/AVH. Pt pleasant and calm.   4. Goal(s): Patient will demonstrate decreased signs of withdrawal due to substance abuse  Met:Yes   Target date:at discharge   As evidenced by: Patient will produce a CIWA/COWS score of 0, have stable vitals signs, and no symptoms of withdrawal.  9/6: Pt reports moderate withdrawal symptoms with CIWA score of 4/COWS of 9, and high sitting pulse.   04/30/2015 Pt reports no signs of withdrawal with CIWA score of 0 and stable vitals. Goal met.   Attendees: Patient:   04/30/2015 10:27 AM   Family:   04/30/2015 10:27 AM   Physician:  Dr. Neita Garnet MD 04/30/2015 10:27 AM   Nursing:   Rise Paganini RN; Clarise Cruz RN 04/30/2015 10:27 AM   Clinical Social Worker: Maxie Better, [place]  04/30/2015 10:27 AM   Clinical Social Worker: Erasmo Downer Drinkard LCSWA; 04/30/2015 10:27 AM   Other:  Gerline Legacy Nurse Case Manager 04/30/2015 10:27 AM   Other:  Lucinda Dell; [facility]  04/30/2015 10:27 AM   Other:  Mickel Baas NP; Aggie Nwoko NP 04/30/2015 10:27 AM   Other:  04/30/2015 10:27 AM   Other:  04/30/2015 10:27 AM   Other:  04/30/2015 10:27 AM    04/30/2015 10:27 AM    04/30/2015 10:27 AM    04/30/2015 10:27 AM    04/30/2015 10:27 AM    Scribe for Treatment Team:   Maxie Better, [place]  04/30/2015 10:27 AM

## 2015-04-30 NOTE — Progress Notes (Signed)
  [facility] Adult Case Management Discharge Plan :  Will you be returning to the same living situation after discharge:  No.Pt accepted to [facility] on Saturday.  At discharge, do you have transportation home?: Yes,  pt's grandmother will transport her to treatment center Saturday--d/c no later than 8am. MD/staff made aware of early discharge.  Do you have the ability to pay for your medications: Yes,  medicaid  Release of information consent forms completed and submitted to medical records by CSW.  Patient to Follow up at: Follow-up Information    Follow up with [facility].   Why:  You are currently on [facility]'s waiting list. If still interested at discharge, please call office and speak with Shayla daily to remain on waitlist.    Contact information:   [address] [place], [place] [address] Phone: [phone] Fax: [phone]      Follow up with [facility].   Why:  You have been accepted for this date. Please arrive with 90day prescription (30 day supply of medication).    Contact information:   ATTN: Nicholos Johns Location manager) [address] [place], [place] [address] Phone: [phone] Fax: Intake coordinator requested that records be mailed.       Patient denies SI/HI: Yes,  during group/self report.    Safety Planning and Suicide Prevention discussed: Yes,  SPE completed with pt's grandmother and SPI pamphlet provided to pt.  Have you used any form of tobacco in the last 30 days? (Cigarettes, Smokeless Tobacco, Cigars, and/or Pipes): Yes  Has patient been referred to the Quitline?: Patient refused referral  Denise Sandoval, Denise Sandoval  04/30/2015, 10:33 AM

## 2015-04-30 NOTE — BHH Group Notes (Signed)
[facility] LCSW Aftercare Discharge Planning Group Note   04/30/2015 10:03 AM  Participation Quality:  DID NOT ATTEND. Pt chose to remain in bed.   Smart, [employer]

## 2015-04-30 NOTE — Progress Notes (Signed)
D: Pt presents appropriate in affect and anxious in mood. Pt continues to voice some anxiety in regards to d/c to a 90 day rehab program on Saturday. However, pt shows excitement in doing the program in hopes of bettering herself for her and her daughter. Pt acknowledges that being distant from her daughter would be difficult but worth it. Pt and writer discussed pt's low BP and symptoms from the morning of 04/29/15. Pt encouraged to increase fluid intake and to report any such symptoms to the writer. Pt receptive to the information.  A: Writer administered scheduled and prn medications to pt, per MD orders . Continued support and availability as needed was extended to this pt. Staff continue to monitor pt with q40min checks.  R: No adverse drug reactions noted. Pt receptive to treatment. Pt remains safe at this time.

## 2015-04-30 NOTE — BHH Suicide Risk Assessment (Signed)
[facility] Discharge Suicide Risk Assessment   Demographic Factors:  Caucasian  Total Time spent with patient: 30 minutes  Musculoskeletal: Strength & Muscle Tone: within normal limits Gait & Station: normal Patient leans: N/A  Psychiatric Specialty Exam: Physical Exam  Review of Systems  Psychiatric/Behavioral: Positive for substance abuse. Negative for depression, suicidal ideas and hallucinations. The patient is nervous/anxious (stable).   All other systems reviewed and are negative.   Blood pressure 81/46, pulse 137, temperature 97.3 F (36.3 C), temperature source Oral, resp. rate 16, height  (1.727 m), weight 67.132 kg (148 lb), last menstrual period 04/21/2015, not currently breastfeeding.Body mass index is 22.51 kg/(m^2).  General Appearance: Casual  Eye Contact::  Fair  Speech:  Normal Rate409  Volume:  Normal  Mood:  Anxious improving  Affect:  Congruent  Thought Process:  Goal Directed  Orientation:  Full (Time, Place, and Person)  Thought Content:  WDL  Suicidal Thoughts:  No  Homicidal Thoughts:  No  Memory:  Immediate;   Fair Recent;   Fair Remote;   Fair  Judgement:  Fair  Insight:  Fair  Psychomotor Activity:  Normal  Concentration:  Fair  Recall:  [employer] of Knowledge:Fair  Language: Fair  Akathisia:  No  Handed:  Right  AIMS (if indicated):     Assets:  Communication Skills Desire for Improvement  Sleep:  Number of Hours: 5.75  Cognition: WNL  ADL's:  Intact   Have you used any form of tobacco in the last 30 days? (Cigarettes, Smokeless Tobacco, Cigars, and/or Pipes): Yes  Has this patient used any form of tobacco in the last 30 days? (Cigarettes, Smokeless Tobacco, Cigars, and/or Pipes) Yes, Prescription provided for nicotine patch  Mental Status Per Nursing Assessment::   On Admission:  NA (denies SI at this time and verbally contracts for safety)  Current Mental Status by Physician: pt denies SI/HI/AH/VH  Loss  Factors: NONE  Historical Factors: Impulsivity  Risk Reduction Factors:   Positive social support  Continued Clinical Symptoms:  Alcohol/Substance Abuse/Dependencies Previous Psychiatric Diagnoses and Treatments  Cognitive Features That Contribute To Risk:  None    Suicide Risk:  Minimal: No identifiable suicidal ideation.  Patients presenting with no risk factors but with morbid ruminations; may be classified as minimal risk based on the severity of the depressive symptoms  Principal Problem: MDD (major depressive disorder), recurrent severe, without psychosis Discharge Diagnoses:  Patient Active Problem List   Diagnosis Date Noted  . MDD (major depressive disorder), recurrent severe, without psychosis [F33.2] 04/30/2015  . Alcohol use disorder, moderate, dependence [F10.20] 04/30/2015  . Opioid use disorder, moderate, dependence [F11.20] 04/30/2015  . 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 Major depressive disorder [F32.2] 04/25/2015  . Polysubstance dependence including opioid drug with daily use [F19.20] 01/16/2015  . Post traumatic stress disorder (PTSD) [F43.10] 01/16/2015  . Postpartum depression [F53] 06/16/2014  . RLQ abdominal pain [R10.31] 05/13/2014  . Postpartum endometritis [O86.12] 05/13/2014  . Active labor at term [O80] 05/06/2014  . Chlamydia infection during pregnancy, antepartum [O98.319, A74.9] 04/22/2014  . Pyelonephritis affecting pregnancy in third trimester, antepartum [O23.03, N12] 02/28/2014  . Marijuana use [F12.10] 01/14/2014  . Rubella non-immune status, antepartum [O99.89, Z28.3] 01/14/2014  . Rh negative state in antepartum period [O36.0190] 01/14/2014  . TSS (toxic shock syndrome) [A48.3] 03/28/2012  . ARF (acute renal failure) [N17.9] 03/26/2012  . Fever of unknown origin [R50.9] 03/24/2012  . Tachycardia [R00.0] 03/24/2012  . Leukocytosis [D72.829] 03/24/2012  .  Hypokalemia [E87.6] 03/24/2012  . Abnormal LFTs  [R79.89] 03/24/2012    Follow-up Information    Follow up with [facility].   Why:  You are currently on [facility]'s waiting list. If still interested at discharge, please call office and speak with Shayla daily to remain on waitlist.    Contact information:   [address] [place], [place] [address] Phone: [phone] Fax: [phone]      Follow up with [facility].   Why:  You have been accepted for this date. Please arrive with 90day prescription (30 day supply of medication).    Contact information:   ATTN: Nicholos Johns Location manager) [address] [place], [place] [address] Phone: [phone] Fax: Intake coordinator requested that records be mailed.       Plan Of Care/Follow-up recommendations:  Activity:  No restrictions Diet:  regular Tests:  as needed Other:  follow up with after care  Is patient on multiple antipsychotic therapies at discharge:  No   Has Patient had three or more failed trials of antipsychotic monotherapy by history:  No  Recommended Plan for Multiple Antipsychotic Therapies: NA    Azyah Flett md 04/30/2015, 3:21 PM

## 2015-04-30 NOTE — Progress Notes (Signed)
Patient ID: Denise Sandoval, female   DOB: [birth year], 19 y.o.   MRN: [id number]  Pt currently presents with an appropriate affect and anxious behavior. Pt reports being anxious and excited about leaving for her 90 day rehab program tomorrow morning. Pt's grandmother will be picking her up at 8am. Pt reports that she is nervous but "I know I need to do this for me and my child." Pt seen smiling on the unit today and interacting with peers positively.  Pt provided with medications per providers orders. Pt's labs and vitals were monitored throughout the day. Pt supported emotionally and encouraged to express concerns and questions. Pt educated on medications. PPd read today, no induration noted. Negative test results.  Pt's safety ensured with 15 minute and environmental checks. Pt currently denies SI/HI and A/V hallucinations. Pt verbally agrees to seek staff if SI/HI or A/VH occurs and to consult with staff before acting on these thoughts. Will continue POC.

## 2015-04-30 NOTE — BHH Group Notes (Signed)
BHH LCSW Group Therapy  04/30/2015 12:46 PM  Type of Therapy:  Group Therapy  Participation Level:  Active  Participation Quality:  Attentive  Affect:  Appropriate  Cognitive:  Alert and Oriented  Insight:  Engaged  Engagement in Therapy:  Engaged  Modes of Intervention:  Confrontation, Discussion, Education, Exploration, Problem-solving, Rapport Building, Socialization and Support  Summary of Progress/Problems: Feelings around Relapse. Group members discussed the meaning of relapse and shared personal stories of relapse, how it affected them and others, and how they perceived themselves during this time. Group members were encouraged to identify triggers, warning signs and coping skills used when facing the possibility of relapse. Social supports were discussed and explored in detail. Post Acute Withdrawal Syndrome (handout provided) was introduced and examined. Pt's were encouraged to ask questions, talk about key points associated with PAWS, and process this information in terms of relapse prevention. Denise Sandoval was attentive and engaged during today's processing group. She expressed being upset because "they messed up my medication and didn't give me enough prescriptions to fill for treatment." Denise Sandoval talked about her past experiences with PAWS and how she plans to prepare for these symptoms while in recovery. She acknowledged the importance of self care.    Smart, Lindon Kiel LCSWA  04/30/2015, 12:46 PM

## 2015-04-30 NOTE — Discharge Summary (Signed)
Physician Discharge Summary Note  Patient:  Denise Sandoval is an 19 y.o., female MRN:  [id number] DOB:  1996-06-17 Patient phone:  [phone] (home)  Patient address:   [address] [place] [address],  Total Time spent with patient: Greater than 30 minutes  Date of Admission:  04/25/2015  Date of Discharge: 01/20/15  Reason for Admission: Drug detox  Principal Problem: MDD (major depressive disorder), recurrent severe, without psychosis Discharge Diagnoses: Patient Active Problem List   Diagnosis Date Noted  . MDD (major depressive disorder), recurrent severe, without psychosis [F33.2] 04/30/2015  . Alcohol use disorder, moderate, dependence [F10.20] 04/30/2015  . Opioid use disorder, moderate, dependence [F11.20] 04/30/2015  . 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 Major depressive disorder [F32.2] 04/25/2015  . Polysubstance dependence including opioid drug with daily use [F19.20] 01/16/2015  . Post traumatic stress disorder (PTSD) [F43.10] 01/16/2015  . Postpartum depression [F53] 06/16/2014  . RLQ abdominal pain [R10.31] 05/13/2014  . Postpartum endometritis [O86.12] 05/13/2014  . Active labor at term [O80] 05/06/2014  . Chlamydia infection during pregnancy, antepartum [O98.319, A74.9] 04/22/2014  . Pyelonephritis affecting pregnancy in third trimester, antepartum [O23.03, N12] 02/28/2014  . Marijuana use [F12.10] 01/14/2014  . Rubella non-immune status, antepartum [O99.89, Z28.3] 01/14/2014  . Rh negative state in antepartum period [O36.0190] 01/14/2014  . TSS (toxic shock syndrome) [A48.3] 03/28/2012  . ARF (acute renal failure) [N17.9] 03/26/2012  . Fever of unknown origin [R50.9] 03/24/2012  . Tachycardia [R00.0] 03/24/2012  . Leukocytosis [D72.829] 03/24/2012  . Hypokalemia [E87.6] 03/24/2012  . Abnormal LFTs [R79.89] 03/24/2012   Musculoskeletal: Strength & Muscle Tone: within normal limits Gait & Station: normal Patient leans:  N/A  Psychiatric Specialty Exam: Physical Exam  Psychiatric: Her speech is normal and behavior is normal. Judgment and thought content normal. Her mood appears not anxious. Her affect is not angry, not blunt, not labile and not inappropriate. Cognition and memory are normal. She does not exhibit a depressed mood.    Review of Systems  Constitutional: Negative.   HENT: Negative.   Eyes: Negative.   Respiratory: Negative.   Cardiovascular: Negative.   Gastrointestinal: Negative.   Genitourinary: Negative.   Musculoskeletal: Negative.   Skin: Negative.   Neurological: Negative.   Endo/Heme/Allergies: Negative.   Psychiatric/Behavioral: Positive for depression (Stable) and substance abuse (Polysubstance dependence, including opiates). Negative for suicidal ideas, hallucinations and memory loss. The patient has insomnia (Stable). The patient is not nervous/anxious.     Blood pressure 81/46, pulse 137, temperature 97.3 F (36.3 C), temperature source Oral, resp. rate 16, height 5\' 8"  (1.727 m), weight 67.132 kg (148 lb), last menstrual period 04/21/2015, not currently breastfeeding.Body mass index is 22.51 kg/(m^2).  See Md's SRA   Have you used any form of tobacco in the last 30 days? (Cigarettes, Smokeless Tobacco, Cigars, and/or Pipes): Yes   Has this patient used any form of tobacco in the last 30 days? (Cigarettes, Smokeless Tobacco, Cigars, and/or Pipes): Yes, prescription provided.  Past Medical History:  Past Medical History  Diagnosis Date  . Supervision of normal first pregnancy in first trimester 09/23/2013  . Kidney infection   . RLQ abdominal pain 05/13/2014  . Postpartum endometritis 05/13/2014  . Depression   . Anxiety     Past Surgical History  Procedure Laterality Date  . No past surgeries     Family History:  Family History  Problem Relation Age of Onset  . Alcohol abuse Mother   . Alcohol abuse Father   .  Hypertension Maternal Grandmother   . Alcohol abuse  Maternal Grandfather   . Alcohol abuse Paternal Grandfather    Social History:  History  Alcohol Use  . 100.8 oz/week  . 168 Cans of beer per week    Comment: daily     History  Drug Use  . 1.00 per week  . Special: Marijuana, Benzodiazepines, Hydrocodone    Comment: 4 days ago cocaine, 1 day ago marijuana, last pain x1 day ago, antianxiety x1 day ago    Social History   Social History  . Marital Status: Single    Spouse Name: N/A  . Number of Children: N/A  . Years of Education: N/A   Social History Main Topics  . Smoking status: Current Every Day Smoker -- 0.50 packs/day for 3 years    Types: Cigarettes  . Smokeless tobacco: Never Used  . Alcohol Use: 100.8 oz/week    168 Cans of beer per week     Comment: daily  . Drug Use: 1.00 per week    Special: Marijuana, Benzodiazepines, Hydrocodone     Comment: 4 days ago cocaine, 1 day ago marijuana, last pain x1 day ago, antianxiety x1 day ago  . Sexual Activity: Yes    Birth Control/ Protection: None, Condom   Other Topics Concern  . None   Social History Narrative   Risk to Self: Is patient at risk for suicide?: Yes What has been your use of drugs/alcohol within the last 12 months?: Alcohol - 1 case of beer daily, Xanax - 2 mg daily, Vicodin - 80 mg daily, Marijuana - 1/4 a week all for the last month  Risk to Others: No  Prior Inpatient Therapy: Yes  Prior Outpatient Therapy: Yes  Level of Care:  OP  Hospital Course: Denise Sandoval, 19 y.o. female, single initially was seen at [facility]. She was accompanied by her mother. Reportedly, patient was abusing alcohol, Xanax, snorting Vicodin for the last 2.5 years. She states a history of depression.She admits that use of alcohol was very recent and is the result of being unable to get a hold of opiates which is her substance of choice. She was seen today and she stated that her emotional trigger was that her one year old child was taken into DSS custody one month ago  because Pt was unable to stop using substances. Pt states she feels her situation is hopeless and is having suicidal thoughts with plan to overdose. Pt states she has intentionally overdosed twice in the past in suicide attempts.She was hospitalized in [place] facilities.   Cathlin was discharged from this hospital on 01-20-15 after receiving treatment for worsening symptoms of depression & drug detoxification treatments. She was to continue further treatment at the [facility] [place]. Marnisha was re-admitted to the [facility] hospital with a BAL of 241 per toxicology tests results. During her admission assessment, Jaydalynn admitted history of multiple drug use which included opiates, benzodiazepines & THC. She required alcohol detoxification as well as continuation of her mood stabilization treatments. For her detox treatments, she was medicated with Ativan detox protocols. Besides the detox treatments, Leler was medicated & discharged on; Buspar 15 mg for anxiety, Neuronton 800 mg for agitation/substance withdrawal syndrome, Sertraline 50 mg for depression, Hydroxyzine 50 mg prn for anxiety & Trazodone 150 mg for insomnia. Omari presented no other pre-existing medical issues that required treatments and or monitoring. She was enrolled and participated in the group counseling sessions being offered & held on this unit. She learned  coping skills. She tolerated her treatment regimen without any significant adverse effects & or reactions.  Simrin has completed detox treatment & her mood is stable. This is evidenced by her reports of improved mood, absence of suicidal ideations & or substance withdrawal symptoms. She is currently being discharged to a treatment center in [place], [place] for further substance abuse treatments, to remain drug free & maintain sobriety. And for medication management & routine psychiatric care, Jaleisa also has a referral to [facility] treatment center as well. She is provided with all the  needed information necessary to make these appointments without problems.  Upon discharge, she adamantly denies any SIHI, AVH, delusional thought, paranoia and or substance withdrawal symptoms. She was provided with a 30 days worth of prescription for all his discharge medications & 2 refills per prescription. All prescriptions called to the [facility] pharmacy. She left [facility] with all personal belongings in no apparent distress. Transportation per grandmother.  Consults:  psychiatry  Significant Diagnostic Studies:  labs: CBC with diff, CMP, UDS, toxicology tests, U/A, results reviewed, stable  Discharge Vitals:   Blood pressure 81/46, pulse 137, temperature 97.3 F (36.3 C), temperature source Oral, resp. rate 16, height 5\' 8"  (1.727 m), weight 67.132 kg (148 lb), last menstrual period 04/21/2015, not currently breastfeeding. Body mass index is 22.51 kg/(m^2). Lab Results:   Results for orders placed or performed during the hospital encounter of 04/25/15 (from the past 72 hour(s))  GC/Chlamydia probe amp ([place])not at [facility]     Status: None   Collection Time: 04/28/15 12:00 AM  Result Value Ref Range   Chlamydia Negative     Comment: Normal Reference Range - Negative   Neisseria gonorrhea Negative     Comment: Normal Reference Range - Negative  HIV antibody     Status: None   Collection Time: 04/28/15  7:18 PM  Result Value Ref Range   HIV Screen 4th Generation wRfx Non Reactive Non Reactive    Comment: (NOTE) Performed At: [facility] [address] [place], [place] [id number] Mila Homer MD WU:[id number] Performed at [facility]   Hepatitis panel, acute     Status: None   Collection Time: 04/28/15  7:18 PM  Result Value Ref Range   Hepatitis B Surface Ag Negative Negative   HCV Ab <0.1 0.0 - 0.9 s/co ratio    Comment: (NOTE)                                  Negative:     < 0.8                             Indeterminate: 0.8 - 0.9                                   Positive:     > 0.9 The CDC recommends that a positive HCV antibody result be followed up with a HCV Nucleic Acid Amplification test ([id number]). Performed At: [facility] [address] [place], [place] [id number] Mila Homer MD [id number]    Hep A IgM Negative Negative   Hep B C IgM Negative Negative    Comment: Performed at [facility]  RPR     Status: None   Collection Time: 04/28/15  7:18 PM  Result Value Ref Range   RPR Ser Ql Non Reactive Non Reactive    Comment: (NOTE) Performed At: [facility] [address] [place], [place] [id number] Mila Homer MD WU:[id number] Performed at [facility]    Physical Findings:  AIMS: Facial and Oral Movements Muscles of Facial Expression: None, normal Lips and Perioral Area: None, normal Jaw: None, normal Tongue: None, normal,Extremity Movements Upper (arms, wrists, hands, fingers): None, normal Lower (legs, knees, ankles, toes): None, normal, Trunk Movements Neck, shoulders, hips: None, normal, Overall Severity Severity of abnormal movements (highest score from questions above): None, normal Incapacitation due to abnormal movements: None, normal Patient's awareness of abnormal movements (rate only patient's report): No Awareness, Dental Status Current problems with teeth and/or dentures?: No Does patient usually wear dentures?: No  CIWA:  CIWA-Ar Total: 1 COWS:  COWS Total Score: 9  See Psychiatric Specialty Exam and Suicide Risk Assessment completed by Attending Physician prior to discharge.  Discharge destination:  Other:  [facility] in [place]  Is patient on multiple antipsychotic therapies at discharge:  No   Has Patient had three or more failed trials of antipsychotic monotherapy by history:  No  Recommended Plan for Multiple Antipsychotic Therapies: NA     Discharge Instructions    Diet - low sodium heart healthy     Complete by:  As directed             Medication List    STOP taking these medications        nicotine 21 mg/24hr patch  Commonly known as:  NICODERM CQ - dosed in mg/24 hours      TAKE these medications      Indication   busPIRone 15 MG tablet  Commonly known as:  BUSPAR  Take 1 tablet (15 mg total) by mouth 3 (three) times daily. For anxiety   Indication:  Generalized Anxiety Disorder     gabapentin 800 MG tablet  Commonly known as:  NEURONTIN  Take 1 tablet (800 mg total) by mouth 3 (three) times daily. For agitation   Indication:  Agitation     hydrOXYzine 50 MG tablet  Commonly known as:  ATARAX/VISTARIL  Take 1 tablet (50 mg total) by mouth 2 (two) times daily as needed for anxiety.   Indication:  Anxiety     sertraline 50 MG tablet  Commonly known as:  ZOLOFT  Take 3 tablets (150 mg total) by mouth daily. For depression   Indication:  Major Depressive Disorder     traZODone 150 MG tablet  Commonly known as:  DESYREL  Take 0.5 tablets (75 mg total) by mouth at bedtime. For insomnia   Indication:  Trouble Sleeping       Follow-up Information    Follow up with [facility].   Why:  You are currently on [facility]'s waiting list. If still interested at discharge, please call office and speak with Shayla daily to remain on waitlist.    Contact information:   [address] [place], [place] [address] Phone: [phone] Fax: [phone]      Follow up with [facility].   Why:  You have been accepted for this date. Please arrive with 90day prescription (30 day supply of medication).    Contact information:   ATTN: Nicholos Johns Location manager) [address] [place], [place] [address] Phone: [phone] Fax: Intake coordinator requested that records be mailed.      Follow-up recommendations:  Activity:  As tolerated Diet:  As recommended by your primary care doctor. Keep all scheduled follow-up appointments as recommended.  Comments: Take all your  medications as prescribed by your mental healthcare provider. Report any adverse effects and or reactions from your medicines to your outpatient provider promptly. Patient is instructed and cautioned to not engage in alcohol and or illegal drug use while on prescription medicines. In the event of worsening symptoms, patient is instructed to call the crisis hotline, 911 and or go to the nearest ED for appropriate evaluation and treatment of symptoms. Follow-up with your primary care provider for your other medical issues, concerns and or health care needs.   Total Discharge Time: Greater than 30 minutes  Signed: Sanjuana Kava, PMHNP, FNP-BC 04/30/2015, 1:52 PM

## 2015-05-01 NOTE — Plan of Care (Signed)
Problem: Alteration in mood & ability to function due to Goal: LTG-Patient demonstrates decreased signs of withdrawal (Patient demonstrates decreased signs of withdrawal to the point the patient is safe to return home and continue treatment in an outpatient setting)  Outcome: Completed/Met Date Met:  05/01/15 No active withdrawal symptoms present.

## 2015-05-01 NOTE — Plan of Care (Signed)
Problem: Diagnosis: Increased Risk For Suicide Attempt Goal: LTG-Patient Will Report Improved Mood and Deny Suicidal LTG (by discharge) Patient will report improved mood and deny suicidal ideation.  Outcome: Progressing Pt denies any SI.

## 2015-05-01 NOTE — Plan of Care (Signed)
Problem: Alteration in mood Goal: LTG-Pt's behavior demonstrates decreased signs of depression (Patient's behavior demonstrates decreased signs of depression to the point the patient is safe to return home and continue treatment in an outpatient setting)  Pt observed smiling throughout the evening and interacting with others within the milieu.

## 2015-05-01 NOTE — Progress Notes (Signed)
D: Pt presents appropriate in affect and pleasant in mood. Some anxiety inferred. Pt is excited about seeing her daughter and going to her 90 day rehab program on Saturday. Pt smiles frequently as she interacts with others. Pt actively participates within the milieu. Pt's BP and pulse within pt's baseline. Pt is negative for any SI/HI/AVH.  A: Writer administered scheduled and prn medications to pt, per MD orders. Continued support and availability as needed was extended to this pt. Staff continue to monitor pt with q77min checks.  R: No adverse drug reactions noted. Pt receptive to treatment. Pt remains safe at this time.

## 2015-05-01 NOTE — Progress Notes (Signed)
Discharge note:  Patient received all personal belongings.  Reviewed discharge instructions with patient.  Patient understood all instructions.  Patient denies SI/HI/AVH.  Patient is going to a facility in [place] and remain there for a 90 day inpatient treatment program.  She was excited about seeing her daughter.  Mother is driving patient directly to [facility].  She left ambulatory without incident.

## 2015-09-08 ENCOUNTER — Encounter (HOSPITAL_COMMUNITY): Payer: Self-pay | Admitting: *Deleted

## 2015-09-08 ENCOUNTER — Emergency Department (HOSPITAL_COMMUNITY)
Admission: EM | Admit: 2015-09-08 | Discharge: 2015-09-09 | Disposition: A | Discharge status: Home / Self Care | Payer: Self-pay | Attending: Emergency Medicine | Admitting: Emergency Medicine

## 2015-09-08 DIAGNOSIS — F17210 Nicotine dependence, cigarettes, uncomplicated: Secondary | ICD-10-CM | POA: Insufficient documentation

## 2015-09-08 DIAGNOSIS — Z87448 Personal history of other diseases of urinary system: Secondary | ICD-10-CM | POA: Insufficient documentation

## 2015-09-08 DIAGNOSIS — J069 Acute upper respiratory infection, unspecified: Secondary | ICD-10-CM | POA: Insufficient documentation

## 2015-09-08 DIAGNOSIS — F329 Major depressive disorder, single episode, unspecified: Secondary | ICD-10-CM | POA: Insufficient documentation

## 2015-09-08 DIAGNOSIS — F419 Anxiety disorder, unspecified: Secondary | ICD-10-CM | POA: Insufficient documentation

## 2015-09-08 DIAGNOSIS — R197 Diarrhea, unspecified: Secondary | ICD-10-CM | POA: Insufficient documentation

## 2015-09-08 DIAGNOSIS — Z79899 Other long term (current) drug therapy: Secondary | ICD-10-CM | POA: Insufficient documentation

## 2015-09-08 DIAGNOSIS — Z3202 Encounter for pregnancy test, result negative: Secondary | ICD-10-CM | POA: Insufficient documentation

## 2015-09-08 LAB — COMPREHENSIVE METABOLIC PANEL
ALK PHOS: 69 U/L (ref 38–126)
ALT: 12 U/L — AB (ref 14–54)
AST: 16 U/L (ref 15–41)
Albumin: 4.1 g/dL (ref 3.5–5.0)
Anion gap: 10 (ref 5–15)
BUN: 8 mg/dL (ref 6–20)
CALCIUM: 8.6 mg/dL — AB (ref 8.9–10.3)
CHLORIDE: 104 mmol/L (ref 101–111)
CO2: 24 mmol/L (ref 22–32)
CREATININE: 0.76 mg/dL (ref 0.44–1.00)
Glucose, Bld: 88 mg/dL (ref 65–99)
Potassium: 3.8 mmol/L (ref 3.5–5.1)
Sodium: 138 mmol/L (ref 135–145)
Total Bilirubin: 0.5 mg/dL (ref 0.3–1.2)
Total Protein: 7.4 g/dL (ref 6.5–8.1)

## 2015-09-08 LAB — CBC WITH DIFFERENTIAL/PLATELET
BASOS ABS: 0.1 10*3/uL (ref 0.0–0.1)
Basophils Relative: 1 %
Eosinophils Absolute: 0.3 10*3/uL (ref 0.0–0.7)
Eosinophils Relative: 2 %
HCT: 36.9 % (ref 36.0–46.0)
HEMOGLOBIN: 12.2 g/dL (ref 12.0–15.0)
LYMPHS ABS: 2.4 10*3/uL (ref 0.7–4.0)
LYMPHS PCT: 19 %
MCH: 30 pg (ref 26.0–34.0)
MCHC: 33.1 g/dL (ref 30.0–36.0)
MCV: 90.7 fL (ref 78.0–100.0)
Monocytes Absolute: 1.7 10*3/uL — ABNORMAL HIGH (ref 0.1–1.0)
Monocytes Relative: 13 %
NEUTROS ABS: 8.4 10*3/uL — AB (ref 1.7–7.7)
NEUTROS PCT: 65 %
Platelets: 296 10*3/uL (ref 150–400)
RBC: 4.07 MIL/uL (ref 3.87–5.11)
RDW: 13.3 % (ref 11.5–15.5)
WBC: 12.8 10*3/uL — AB (ref 4.0–10.5)

## 2015-09-08 NOTE — ED Notes (Signed)
Pt c/o fever and vomiting since last night

## 2015-09-08 NOTE — ED Provider Notes (Signed)
CSN: [id number]     Arrival date & time 09/08/15  2042 History  By signing my name below, I, Denise Sandoval, attest that this documentation has been prepared under the direction and in the presence of Shon Baton, MD. Electronically Signed: Octavia Heir, ED Scribe. 09/09/2015. 12:05 AM.    Chief Complaint  Patient presents with  . Emesis      The history is provided by the patient. No language interpreter was used.   HPI Comments: Denise Sandoval is a 20 y.o. female who has a hx of postpartum endometritis presents to the Emergency Department complaining of constant, gradual worsening cough onset one week ago. She has been having associated congestion, vomiting, body aches, fever (tmax 101.5), and diarrhea. Pt has been alternating tylenol, ibuprofen, and OTC medications to alleviate her symptoms with no relief. She denies sore throat and abdominal pain. Pt is a smoker. Her last period was January 2nd.  Past Medical History  Diagnosis Date  . Supervision of normal first pregnancy in first trimester 09/23/2013  . Kidney infection   . RLQ abdominal pain 05/13/2014  . Postpartum endometritis 05/13/2014  . Depression   . Anxiety    Past Surgical History  Procedure Laterality Date  . No past surgeries     Family History  Problem Relation Age of Onset  . Alcohol abuse Mother   . Alcohol abuse Father   . Hypertension Maternal Grandmother   . Alcohol abuse Maternal Grandfather   . Alcohol abuse Paternal Grandfather    Social History  Substance Use Topics  . Smoking status: Current Every Day Smoker -- 0.50 packs/day for 3 years    Types: Cigarettes  . Smokeless tobacco: Never Used  . Alcohol Use: 100.8 oz/week    168 Cans of beer per week     Comment: daily   OB History    Gravida Para Term Preterm AB TAB SAB Ectopic Multiple Living   Review of Systems  Constitutional: Positive for fever and chills.  HENT: Positive for congestion. Negative for sore throat.    Respiratory: Positive for cough.   Gastrointestinal: Positive for nausea and vomiting. Negative for abdominal distention.  All other systems reviewed and are negative.     Allergies  Review of patient's allergies indicates no known allergies.  Home Medications   Prior to Admission medications   Medication Sig Start Date End Date Taking? Authorizing Provider  albuterol (PROVENTIL HFA;VENTOLIN HFA) 108 (90 Base) MCG/ACT inhaler Inhale 2 puffs into the lungs every 4 (four) hours as needed for wheezing or shortness of breath. 09/09/15   Shon Baton, MD  benzonatate (TESSALON) 100 MG capsule Take 1 capsule (100 mg total) by mouth every 8 (eight) hours. 09/09/15   Shon Baton, MD  busPIRone (BUSPAR) 15 MG tablet Take 1 tablet (15 mg total) by mouth 3 (three) times daily. For anxiety 04/30/15   Sanjuana Kava, NP  gabapentin (NEURONTIN) 800 MG tablet Take 1 tablet (800 mg total) by mouth 3 (three) times daily. For agitation 04/30/15   Sanjuana Kava, NP  hydrOXYzine (ATARAX/VISTARIL) 50 MG tablet Take 1 tablet (50 mg total) by mouth 2 (two) times daily as needed for anxiety. 04/30/15   Sanjuana Kava, NP  ondansetron (ZOFRAN ODT) 4 MG disintegrating tablet Take 1 tablet (4 mg total) by mouth every 8 (eight) hours as needed for nausea or vomiting. 09/09/15   Toni Amend  F Horton, MD  sertraline (ZOLOFT) 50 MG tablet Take 3 tablets (150 mg total) by mouth daily. For depression 04/30/15   Sanjuana Kava, NP  traZODone (DESYREL) 150 MG tablet Take 0.5 tablets (75 mg total) by mouth at bedtime. For insomnia 04/30/15   Sanjuana Kava, NP   Triage vitals: BP 113/80 mmHg  Pulse 96  Temp(Src) 98.6 F (37 C) (Temporal)  Resp 24  Ht  (1.727 m)  Wt 150 lb (68.04 kg)  BMI 22.81 kg/m2  SpO2 100%  LMP 08/23/2015 Physical Exam  Constitutional: She is oriented to person, place, and time. She appears well-developed and well-nourished. No distress.  HENT:  Head: Normocephalic and atraumatic.  Mucous  membranes dry  Cardiovascular: Normal rate, regular rhythm and normal heart sounds.   Pulmonary/Chest: Effort normal. No respiratory distress. She has wheezes.  Scant expiratory wheeze  Abdominal: Soft. Bowel sounds are normal. There is no tenderness. There is no rebound.  Neurological: She is alert and oriented to person, place, and time.  Skin: Skin is warm and dry.  Psychiatric: She has a normal mood and affect.  Nursing note and vitals reviewed.   ED Course  Procedures  DIAGNOSTIC STUDIES: Oxygen Saturation is 100% on RA, normal by my interpretation.  COORDINATION OF CARE:  12:04 AM Discussed treatment plan which includes fluids, zofran, and inhaler with pt at bedside and pt agreed to plan.  Labs Review Labs Reviewed  CBC WITH DIFFERENTIAL/PLATELET - Abnormal; Notable for the following:    WBC 12.8 (*)    Neutro Abs 8.4 (*)    Monocytes Absolute 1.7 (*)    All other components within normal limits  COMPREHENSIVE METABOLIC PANEL - Abnormal; Notable for the following:    Calcium 8.6 (*)    ALT 12 (*)    All other components within normal limits  URINALYSIS, ROUTINE W REFLEX MICROSCOPIC (NOT AT [facility]) - Abnormal; Notable for the following:    Nitrite POSITIVE (*)    All other components within normal limits  URINE MICROSCOPIC-ADD ON - Abnormal; Notable for the following:    Squamous Epithelial / LPF 6-30 (*)    Bacteria, UA MANY (*)    All other components within normal limits  PREGNANCY, URINE    Imaging Review Dg Chest 2 View  09/09/2015  CLINICAL DATA:  20 year old female with productive cough EXAM: CHEST  2 VIEW COMPARISON:  Radiograph dated 11/15/2014 FINDINGS: The heart size and mediastinal contours are within normal limits. Both lungs are clear. The visualized skeletal structures are unremarkable. IMPRESSION: No active cardiopulmonary disease. Electronically Signed   By: Elgie Collard M.D.   On: 09/09/2015 01:41   I have personally reviewed and evaluated these  images and lab results as part of my medical decision-making.   EKG Interpretation None      MDM   Final diagnoses:  Upper respiratory infection, viral    Patient presents with upper respiratory symptoms. Ongoing for the last several days. She's afebrile and vital signs are reassuring. Exam is largely unremarkable with exception of scant expiratory wheezing. Symptoms are likely viral in nature. Given reported fevers, patient could have the flu; however, she is out of the window for Tamiflu. Patient was given fluids and nausea medication. Mild leukocytosis. Urine is nitrite positive but without white blood cells. Patient denies urinary symptoms. Chest x-ray is without evidence of pneumonia. Patient given an inhaler given that she is a smoker. Will discharge home with Jerilynn Som, inhaler, and Zofran for  symptom control.  After history, exam, and medical workup I feel the patient has been appropriately medically screened and is safe for discharge home. Pertinent diagnoses were discussed with the patient. Patient was given return precautions.  I personally performed the services described in this documentation, which was scribed in my presence. The recorded information has been reviewed and is accurate.   Shon Baton, MD 09/09/15 [phone]

## 2015-09-09 ENCOUNTER — Emergency Department (HOSPITAL_COMMUNITY): Payer: Medicaid Other

## 2015-09-09 LAB — URINE MICROSCOPIC-ADD ON

## 2015-09-09 LAB — PREGNANCY, URINE: PREG TEST UR: NEGATIVE

## 2015-09-09 LAB — URINALYSIS, ROUTINE W REFLEX MICROSCOPIC
BILIRUBIN URINE: NEGATIVE
GLUCOSE, UA: NEGATIVE mg/dL
HGB URINE DIPSTICK: NEGATIVE
Ketones, ur: NEGATIVE mg/dL
Leukocytes, UA: NEGATIVE
Nitrite: POSITIVE — AB
PROTEIN: NEGATIVE mg/dL
SPECIFIC GRAVITY, URINE: 1.01 (ref 1.005–1.030)
pH: 5.5 (ref 5.0–8.0)

## 2015-09-09 MED ORDER — ONDANSETRON HCL 4 MG/2ML IJ SOLN
4.0000 mg | Freq: Once | INTRAMUSCULAR | Status: AC
  Administered 2015-09-09: 4 mg via INTRAVENOUS
  Filled 2015-09-09: qty 2

## 2015-09-09 MED ORDER — BENZONATATE 100 MG PO CAPS: 100.0000 mg | ORAL_CAPSULE | Freq: Three times a day (TID) | ORAL | Status: DC

## 2015-09-09 MED ORDER — ALBUTEROL SULFATE HFA 108 (90 BASE) MCG/ACT IN AERS
2.0000 | INHALATION_SPRAY | Freq: Once | RESPIRATORY_TRACT | Status: AC
  Administered 2015-09-09: 2 via RESPIRATORY_TRACT
  Filled 2015-09-09: qty 6.7

## 2015-09-09 MED ORDER — ONDANSETRON 4 MG PO TBDP: 4.0000 mg | ORAL_TABLET | Freq: Three times a day (TID) | ORAL | Status: DC | PRN

## 2015-09-09 MED ORDER — ONDANSETRON HCL 4 MG/2ML IJ SOLN: 4.0000 mg | Freq: Once | INTRAMUSCULAR | Status: DC

## 2015-09-09 MED ORDER — SODIUM CHLORIDE 0.9 % IV BOLUS (SEPSIS)
1000.0000 mL | Freq: Once | INTRAVENOUS | Status: AC
  Administered 2015-09-09: 1000 mL via INTRAVENOUS

## 2015-09-09 MED ORDER — ALBUTEROL SULFATE HFA 108 (90 BASE) MCG/ACT IN AERS: 2.0000 | INHALATION_SPRAY | RESPIRATORY_TRACT | Status: DC | PRN

## 2015-09-09 NOTE — Discharge Instructions (Signed)
Upper Respiratory Infection, Adult Most upper respiratory infections (URIs) are a viral infection of the air passages leading to the lungs. A URI affects the nose, throat, and upper air passages. The most common type of URI is nasopharyngitis and is typically referred to as "the common cold." URIs run their course and usually go away on their own. Most of the time, a URI does not require medical attention, but sometimes a bacterial infection in the upper airways can follow a viral infection. This is called a secondary infection. Sinus and middle ear infections are common types of secondary upper respiratory infections. Bacterial pneumonia can also complicate a URI. A URI can worsen asthma and chronic obstructive pulmonary disease (COPD). Sometimes, these complications can require emergency medical care and may be life threatening.  CAUSES Almost all URIs are caused by viruses. A virus is a type of germ and can spread from one person to another.  RISKS FACTORS You may be at risk for a URI if:   You smoke.   You have chronic heart or lung disease.  You have a weakened defense (immune) system.   You are very young or very old.   You have nasal allergies or asthma.  You work in crowded or poorly ventilated areas.  You work in health care facilities or schools. SIGNS AND SYMPTOMS  Symptoms typically develop 2-3 days after you come in contact with a cold virus. Most viral URIs last 7-10 days. However, viral URIs from the influenza virus (flu virus) can last 14-18 days and are typically more severe. Symptoms may include:   Runny or stuffy (congested) nose.   Sneezing.   Cough.   Sore throat.   Headache.   Fatigue.   Fever.   Loss of appetite.   Pain in your forehead, behind your eyes, and over your cheekbones (sinus pain).  Muscle aches.  DIAGNOSIS  Your health care provider may diagnose a URI by:  Physical exam.  Tests to check that your symptoms are not due to  another condition such as:  Strep throat.  Sinusitis.  Pneumonia.  Asthma. TREATMENT  A URI goes away on its own with time. It cannot be cured with medicines, but medicines may be prescribed or recommended to relieve symptoms. Medicines may help:  Reduce your fever.  Reduce your cough.  Relieve nasal congestion. HOME CARE INSTRUCTIONS   Take medicines only as directed by your health care provider.   Gargle warm saltwater or take cough drops to comfort your throat as directed by your health care provider.  Use a warm mist humidifier or inhale steam from a shower to increase air moisture. This may make it easier to breathe.  Drink enough fluid to keep your urine clear or pale yellow.   Eat soups and other clear broths and maintain good nutrition.   Rest as needed.   Return to work when your temperature has returned to normal or as your health care provider advises. You may need to stay home longer to avoid infecting others. You can also use a face mask and careful hand washing to prevent spread of the virus.  Increase the usage of your inhaler if you have asthma.   Do not use any tobacco products, including cigarettes, chewing tobacco, or electronic cigarettes. If you need help quitting, ask your health care provider. PREVENTION  The best way to protect yourself from getting a cold is to practice good hygiene.   Avoid oral or hand contact with people with cold   symptoms.   Wash your hands often if contact occurs.  There is no clear evidence that vitamin C, vitamin E, echinacea, or exercise reduces the chance of developing a cold. However, it is always recommended to get plenty of rest, exercise, and practice good nutrition.  SEEK MEDICAL CARE IF:   You are getting worse rather than better.   Your symptoms are not controlled by medicine.   You have chills.  You have worsening shortness of breath.  You have brown or red mucus.  You have yellow or brown nasal  discharge.  You have pain in your face, especially when you bend forward.  You have a fever.  You have swollen neck glands.  You have pain while swallowing.  You have white areas in the back of your throat. SEEK IMMEDIATE MEDICAL CARE IF:   You have severe or persistent:  Headache.  Ear pain.  Sinus pain.  Chest pain.  You have chronic lung disease and any of the following:  Wheezing.  Prolonged cough.  Coughing up blood.  A change in your usual mucus.  You have a stiff neck.  You have changes in your:  Vision.  Hearing.  Thinking.  Mood. MAKE SURE YOU:   Understand these instructions.  Will watch your condition.  Will get help right away if you are not doing well or get worse.   This information is not intended to replace advice given to you by your health care provider. Make sure you discuss any questions you have with your health care provider.   Document Released: 01/31/2001 Document Revised: 12/22/2014 Document Reviewed: 11/12/2013 Elsevier Interactive Patient Education 2016 [employer] Inc.  

## 2015-09-09 NOTE — ED Notes (Signed)
Pt and mother heard yelling; in to see pt and she is crying stating she is ready to leave. Dr. Wilkie Aye informed of pt's request

## 2015-11-09 ENCOUNTER — Emergency Department (HOSPITAL_COMMUNITY): Payer: BLUE CROSS/BLUE SHIELD

## 2015-11-09 ENCOUNTER — Emergency Department (HOSPITAL_COMMUNITY)
Admission: EM | Admit: 2015-11-09 | Discharge: 2015-11-09 | Disposition: A | Discharge status: Home / Self Care | Payer: BLUE CROSS/BLUE SHIELD | Attending: Emergency Medicine | Admitting: Emergency Medicine

## 2015-11-09 ENCOUNTER — Encounter (HOSPITAL_COMMUNITY): Payer: Self-pay | Admitting: Emergency Medicine

## 2015-11-09 DIAGNOSIS — Y9289 Other specified places as the place of occurrence of the external cause: Secondary | ICD-10-CM | POA: Diagnosis not present

## 2015-11-09 DIAGNOSIS — X58XXXA Exposure to other specified factors, initial encounter: Secondary | ICD-10-CM | POA: Diagnosis not present

## 2015-11-09 DIAGNOSIS — Y998 Other external cause status: Secondary | ICD-10-CM | POA: Diagnosis not present

## 2015-11-09 DIAGNOSIS — F329 Major depressive disorder, single episode, unspecified: Secondary | ICD-10-CM | POA: Insufficient documentation

## 2015-11-09 DIAGNOSIS — R55 Syncope and collapse: Secondary | ICD-10-CM | POA: Insufficient documentation

## 2015-11-09 DIAGNOSIS — Y9389 Activity, other specified: Secondary | ICD-10-CM | POA: Insufficient documentation

## 2015-11-09 DIAGNOSIS — Z87448 Personal history of other diseases of urinary system: Secondary | ICD-10-CM | POA: Insufficient documentation

## 2015-11-09 DIAGNOSIS — Z3202 Encounter for pregnancy test, result negative: Secondary | ICD-10-CM | POA: Insufficient documentation

## 2015-11-09 DIAGNOSIS — R569 Unspecified convulsions: Secondary | ICD-10-CM | POA: Diagnosis present

## 2015-11-09 DIAGNOSIS — F419 Anxiety disorder, unspecified: Secondary | ICD-10-CM | POA: Diagnosis not present

## 2015-11-09 DIAGNOSIS — Z79899 Other long term (current) drug therapy: Secondary | ICD-10-CM | POA: Diagnosis not present

## 2015-11-09 DIAGNOSIS — M542 Cervicalgia: Secondary | ICD-10-CM | POA: Diagnosis not present

## 2015-11-09 DIAGNOSIS — F1210 Cannabis abuse, uncomplicated: Secondary | ICD-10-CM | POA: Diagnosis not present

## 2015-11-09 DIAGNOSIS — F17210 Nicotine dependence, cigarettes, uncomplicated: Secondary | ICD-10-CM | POA: Diagnosis not present

## 2015-11-09 DIAGNOSIS — M545 Low back pain: Secondary | ICD-10-CM | POA: Diagnosis not present

## 2015-11-09 DIAGNOSIS — S00512A Abrasion of oral cavity, initial encounter: Secondary | ICD-10-CM | POA: Insufficient documentation

## 2015-11-09 LAB — COMPREHENSIVE METABOLIC PANEL
ALT: 12 U/L — AB (ref 14–54)
AST: 19 U/L (ref 15–41)
Albumin: 3.9 g/dL (ref 3.5–5.0)
Alkaline Phosphatase: 69 U/L (ref 38–126)
Anion gap: 14 (ref 5–15)
BUN: 8 mg/dL (ref 6–20)
CHLORIDE: 106 mmol/L (ref 101–111)
CO2: 22 mmol/L (ref 22–32)
CREATININE: 0.84 mg/dL (ref 0.44–1.00)
Calcium: 8.9 mg/dL (ref 8.9–10.3)
GFR calc Af Amer: >60 mL/min (ref >60)
GFR calc non Af Amer: >60 mL/min (ref >60)
GLUCOSE: 81 mg/dL (ref 65–99)
Potassium: 4 mmol/L (ref 3.5–5.1)
SODIUM: 142 mmol/L (ref 135–145)
Total Bilirubin: 0.6 mg/dL (ref 0.3–1.2)
Total Protein: 7 g/dL (ref 6.5–8.1)

## 2015-11-09 LAB — PREGNANCY, URINE: Preg Test, Ur: NEGATIVE

## 2015-11-09 LAB — CBC WITH DIFFERENTIAL/PLATELET
BASOS ABS: 0 10*3/uL (ref 0.0–0.1)
BASOS PCT: 0 %
EOS PCT: 0 %
Eosinophils Absolute: 0 10*3/uL (ref 0.0–0.7)
HCT: 37.9 % (ref 36.0–46.0)
Hemoglobin: 12.7 g/dL (ref 12.0–15.0)
LYMPHS PCT: 14 %
Lymphs Abs: 1.3 10*3/uL (ref 0.7–4.0)
MCH: 29 pg (ref 26.0–34.0)
MCHC: 33.5 g/dL (ref 30.0–36.0)
MCV: 86.5 fL (ref 78.0–100.0)
MONO ABS: 0.6 10*3/uL (ref 0.1–1.0)
Monocytes Relative: 6 %
Neutro Abs: 7.9 10*3/uL — ABNORMAL HIGH (ref 1.7–7.7)
Neutrophils Relative %: 80 %
PLATELETS: 374 10*3/uL (ref 150–400)
RBC: 4.38 MIL/uL (ref 3.87–5.11)
RDW: 13.6 % (ref 11.5–15.5)
WBC: 9.9 10*3/uL (ref 4.0–10.5)

## 2015-11-09 LAB — URINALYSIS, ROUTINE W REFLEX MICROSCOPIC
Bilirubin Urine: NEGATIVE
GLUCOSE, UA: NEGATIVE mg/dL
Hgb urine dipstick: NEGATIVE
KETONES UR: 15 mg/dL — AB
LEUKOCYTES UA: NEGATIVE
Nitrite: NEGATIVE
PROTEIN: NEGATIVE mg/dL
Specific Gravity, Urine: 1.01 (ref 1.005–1.030)
pH: 5.5 (ref 5.0–8.0)

## 2015-11-09 LAB — RAPID URINE DRUG SCREEN, HOSP PERFORMED
Amphetamines: NOT DETECTED
Barbiturates: NOT DETECTED
Benzodiazepines: NOT DETECTED
COCAINE: NOT DETECTED
Opiates: NOT DETECTED
Tetrahydrocannabinol: POSITIVE — AB

## 2015-11-09 LAB — MAGNESIUM: Magnesium: 1.9 mg/dL (ref 1.7–2.4)

## 2015-11-09 LAB — ETHANOL

## 2015-11-09 MED ORDER — METHOCARBAMOL 500 MG PO TABS: 1000.0000 mg | ORAL_TABLET | Freq: Three times a day (TID) | ORAL | Status: DC | PRN

## 2015-11-09 MED ORDER — LORAZEPAM 2 MG/ML IJ SOLN
0.5000 mg | Freq: Once | INTRAMUSCULAR | Status: AC
  Administered 2015-11-09: 0.5 mg via INTRAVENOUS
  Filled 2015-11-09: qty 1

## 2015-11-09 MED ORDER — LORAZEPAM 1 MG PO TABS: 1.0000 mg | ORAL_TABLET | Freq: Two times a day (BID) | ORAL | Status: DC | PRN

## 2015-11-09 MED ORDER — SODIUM CHLORIDE 0.9 % IV BOLUS (SEPSIS)
1000.0000 mL | Freq: Once | INTRAVENOUS | Status: AC
  Administered 2015-11-09: 1000 mL via INTRAVENOUS

## 2015-11-09 NOTE — ED Notes (Signed)
Patient comes from work states she was sitting in a chair and had a witness seizure last about a min. Patient has a family Hx of seizure but has no Hx of any. Patient did bite her tongue. Per EMs pupils 5 sluggish. Patient states she has just started taking new medication for anxiety.

## 2015-11-09 NOTE — ED Provider Notes (Signed)
CSN: [id number]     Arrival date & time 11/09/15  1220 History   First MD Initiated Contact with Patient 11/09/15 1239     Chief Complaint  Patient presents with  . Seizures     (Consider location/radiation/quality/duration/timing/severity/associated sxs/prior Treatment) HPI She presents with new onset seizure. She states she's never had seizures in the past. It was at work in her normal state of health. Had a witnessed seizure by a colleague. Unsure the length of the seizure activity. Patient states she bit her tongue. No bowel or bladder incontinence. She complains of neck pain and back pain currently. See drinking energy drinks. Denies any alcohol use. States she feels shaky. 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 Past Medical History  Diagnosis Date  . Supervision of normal first pregnancy in first trimester 09/23/2013  . Kidney infection   . RLQ abdominal pain 05/13/2014  . Postpartum endometritis 05/13/2014  . Depression   . Anxiety    Past Surgical History  Procedure Laterality Date  . No past surgeries     Family History  Problem Relation Age of Onset  . Alcohol abuse Mother   . Alcohol abuse Father   . Hypertension Maternal Grandmother   . Alcohol abuse Maternal Grandfather   . Alcohol abuse Paternal Grandfather    Social History  Substance Use Topics  . Smoking status: Current Every Day Smoker -- 0.50 packs/day for 3 years    Types: Cigarettes  . Smokeless tobacco: Never Used  . Alcohol Use: 100.8 oz/week    168 Cans of beer per week     Comment: daily   OB History    Gravida Para Term Preterm AB TAB SAB Ectopic Multiple Living   Review of Systems  Constitutional: Positive for fever. Negative for chills.  Respiratory: Negative for chest tightness and shortness of breath.   Cardiovascular: Negative for chest pain.  Gastrointestinal: Negative for nausea, vomiting, abdominal pain and diarrhea.  Musculoskeletal: Positive for  myalgias, back pain and neck pain.  Skin: Negative for rash and wound.  Neurological: Positive for tremors, seizures and syncope. Negative for dizziness, weakness, light-headedness, numbness and headaches.  Psychiatric/Behavioral: The patient is nervous/anxious.   All other systems reviewed and are negative.     Allergies  Review of patient's allergies indicates no known allergies.  Home Medications   Prior to Admission medications   Medication Sig Start Date End Date Taking? Authorizing Provider  albuterol (PROVENTIL HFA;VENTOLIN HFA) 108 (90 Base) MCG/ACT inhaler Inhale 2 puffs into the lungs every 4 (four) hours as needed for wheezing or shortness of breath. 09/09/15  Yes Shon Baton, MD  gabapentin (NEURONTIN) 800 MG tablet Take 1 tablet (800 mg total) by mouth 3 (three) times daily. For agitation 04/30/15  Yes Sanjuana Kava, NP  sertraline (ZOLOFT) 50 MG tablet Take 3 tablets (150 mg total) by mouth daily. For depression 04/30/15  Yes Sanjuana Kava, NP  traZODone (DESYREL) 150 MG tablet Take 0.5 tablets (75 mg total) by mouth at bedtime. For insomnia Patient taking differently: Take 150 mg by mouth at bedtime. For insomnia 04/30/15  Yes Sanjuana Kava, NP  benzonatate (TESSALON) 100 MG capsule Take 1 capsule (100 mg total) by mouth every 8 (eight) hours. Patient not taking: Reported on 11/09/2015 09/09/15   Shon Baton, MD  busPIRone (BUSPAR) 15 MG tablet Take 1 tablet (15 mg total) by mouth  3 (three) times daily. For anxiety Patient not taking: Reported on 11/09/2015 04/30/15   Sanjuana KavaAgnes I Nwoko, NP  LORazepam (ATIVAN) 1 MG tablet Take 1 tablet (1 mg total) by mouth 2 (two) times daily as needed for anxiety. For anxiety 11/09/15   Loren Raceravid Jordanny Waddington, MD  methocarbamol (ROBAXIN) 500 MG tablet Take 2 tablets (1,000 mg total) by mouth every 8 (eight) hours as needed for muscle spasms. 11/09/15   Loren Raceravid Roland Lipke, MD  ondansetron (ZOFRAN ODT) 4 MG disintegrating tablet Take 1 tablet (4 mg total) by  mouth every 8 (eight) hours as needed for nausea or vomiting. Patient not taking: Reported on 11/09/2015 09/09/15   Shon Batonourtney F Horton, MD   BP 118/66 mmHg  Pulse 84  Temp(Src) 98.3 F (36.8 C) (Oral)  Resp 23  SpO2 97%  LMP 10/20/2015 Physical Exam  Constitutional: She is oriented to person, place, and time. She appears well-developed and well-nourished. No distress.  HENT:  Head: Normocephalic and atraumatic.  Mouth/Throat: Oropharynx is clear and moist.  Patient does have an abrasion to the lateral surface of her tongue on the left.  Eyes: EOM are normal. Pupils are equal, round, and reactive to light.  Pupils are 5 mm bilaterally and responsive to light  Neck:  Cervical collar is in place.  Cardiovascular: Normal rate and regular rhythm.  Exam reveals no gallop and no friction rub.   No murmur heard. Pulmonary/Chest: Effort normal and breath sounds normal. No respiratory distress. She has no wheezes. She has no rales. She exhibits no tenderness.  Abdominal: Soft. Bowel sounds are normal. She exhibits no distension and no mass. There is no tenderness. There is no rebound and no guarding.  Musculoskeletal: Normal range of motion. She exhibits tenderness. She exhibits no edema.  Diffuse thoracic and lumbar tenderness to palpation. Distal pulses are equal and intact. No lower extremity asymmetry, swelling or tenderness.  Neurological: She is alert and oriented to person, place, and time.  5/5 motor in all extremities. Sensation is fully intact. Patient does have fine tremor  Skin: Skin is warm and dry. No rash noted. No erythema.  Psychiatric: Her behavior is normal.  Anxious appearing  Nursing note and vitals reviewed.   ED Course  Procedures (including critical care time) Labs Review Labs Reviewed  CBC WITH DIFFERENTIAL/PLATELET - Abnormal; Notable for the following:    Neutro Abs 7.9 (*)    All other components within normal limits  URINALYSIS, ROUTINE W REFLEX MICROSCOPIC  (NOT AT [facility]) - Abnormal; Notable for the following:    APPearance HAZY (*)    Ketones, ur 15 (*)    All other components within normal limits  URINE RAPID DRUG SCREEN, HOSP PERFORMED - Abnormal; Notable for the following:    Tetrahydrocannabinol POSITIVE (*)    All other components within normal limits  COMPREHENSIVE METABOLIC PANEL - Abnormal; Notable for the following:    ALT 12 (*)    All other components within normal limits  PREGNANCY, URINE  ETHANOL  MAGNESIUM    Imaging Review Dg Lumbar Spine 2-3 Views  11/09/2015  CLINICAL DATA:  New onset seizure with fall and low back pain following fall EXAM: LUMBAR SPINE - 3 VIEW COMPARISON:  11/20/2014 FINDINGS: Five lumbar type vertebral bodies are well visualized. Vertebral body height is well maintained. No acute fracture is noted. No soft tissue abnormality is seen. IMPRESSION: No acute abnormality noted. Electronically Signed   By: Alcide CleverMark  Lukens M.D.   On: 11/09/2015 15:52   Ct  Head Wo Contrast  11/09/2015  CLINICAL DATA:  New onset seizure.  Posterior neck pain. EXAM: CT HEAD WITHOUT CONTRAST CT CERVICAL SPINE WITHOUT CONTRAST TECHNIQUE: Multidetector CT imaging of the head and cervical spine was performed following the standard protocol without intravenous contrast. Multiplanar CT image reconstructions of the cervical spine were also generated. COMPARISON:  11/15/2014 FINDINGS: CT HEAD FINDINGS There is no evidence of mass effect, midline shift or extra-axial fluid collections. There is no evidence of a space-occupying lesion or intracranial hemorrhage. There is no evidence of a cortical-based area of acute infarction. The ventricles and sulci are appropriate for the patient's age. The basal cisterns are patent. Visualized portions of the orbits are unremarkable. The visualized portions of the paranasal sinuses and mastoid air cells are unremarkable. The osseous structures are unremarkable. CT CERVICAL SPINE FINDINGS The alignment is anatomic.  The vertebral body heights are maintained. There is no acute fracture. There is no static listhesis. The prevertebral soft tissues are normal. The intraspinal soft tissues are not fully imaged on this examination due to poor soft tissue contrast, but there is no gross soft tissue abnormality. The disc spaces are maintained. The visualized portions of the lung apices demonstrate no focal abnormality. IMPRESSION: 1. No acute intracranial pathology. 2. No acute osseous injury of the cervical spine. Electronically Signed   By: Elige Ko   On: 11/09/2015 13:53   Ct Cervical Spine Wo Contrast  11/09/2015  CLINICAL DATA:  New onset seizure.  Posterior neck pain. EXAM: CT HEAD WITHOUT CONTRAST CT CERVICAL SPINE WITHOUT CONTRAST TECHNIQUE: Multidetector CT imaging of the head and cervical spine was performed following the standard protocol without intravenous contrast. Multiplanar CT image reconstructions of the cervical spine were also generated. COMPARISON:  11/15/2014 FINDINGS: CT HEAD FINDINGS There is no evidence of mass effect, midline shift or extra-axial fluid collections. There is no evidence of a space-occupying lesion or intracranial hemorrhage. There is no evidence of a cortical-based area of acute infarction. The ventricles and sulci are appropriate for the patient's age. The basal cisterns are patent. Visualized portions of the orbits are unremarkable. The visualized portions of the paranasal sinuses and mastoid air cells are unremarkable. The osseous structures are unremarkable. CT CERVICAL SPINE FINDINGS The alignment is anatomic. The vertebral body heights are maintained. There is no acute fracture. There is no static listhesis. The prevertebral soft tissues are normal. The intraspinal soft tissues are not fully imaged on this examination due to poor soft tissue contrast, but there is no gross soft tissue abnormality. The disc spaces are maintained. The visualized portions of the lung apices  demonstrate no focal abnormality. IMPRESSION: 1. No acute intracranial pathology. 2. No acute osseous injury of the cervical spine. Electronically Signed   By: Elige Ko   On: 11/09/2015 13:53   I have personally reviewed and evaluated these images and lab results as part of my medical decision-making.   EKG Interpretation   Date/Time:  Tuesday November 09 2015 12:27:04 EDT Ventricular Rate:  83 PR Interval:  121 QRS Duration: 95 QT Interval:  371 QTC Calculation: 436 R Axis:   58 Text Interpretation:  Ectopic atrial rhythm Confirmed by Ranae Palms  MD,  Izumi Mixon ([id number]) on 11/09/2015 1:16:17 PM      MDM   Final diagnoses:  New onset seizure (HCC)    Questionable benzo withdrawal versus stimulant induced seizure like activity. Patient states she feels much better after the Ativan. Heart rate has improved. No more tremor. CT head without  any acute findings. Patient admits to taking Ativan twice daily as recently prescribed for anxiety. She's been out of the Ativan for the last 2 days. States she has an appointment to see her psychiatrist in early April. Discussed with neurology on-call. Recommend outpatient follow-up.   Loren Racer, MD 11/10/15 [phone]

## 2015-11-09 NOTE — Discharge Instructions (Signed)
Call and make an appointment to follow-up with the neurologist. Do not drive or operate any heavy machinery until you're cleared by neurology. Return immediately for any seizure-like activity or for any concerns.  Seizure, Adult A seizure is abnormal electrical activity in the brain. Seizures usually last from 30 seconds to 2 minutes. There are various types of seizures. Before a seizure, you may have a warning sensation (aura) that a seizure is about to occur. An aura may include the following symptoms:   Fear or anxiety.  Nausea.  Feeling like the room is spinning (vertigo).  Vision changes, such as seeing flashing lights or spots. Common symptoms during a seizure include:  A change in attention or behavior (altered mental status).  Convulsions with rhythmic jerking movements.  Drooling.  Rapid eye movements.  Grunting.  Loss of bladder and bowel control.  Bitter taste in the mouth.  Tongue biting. After a seizure, you may feel confused and sleepy. You may also have an injury resulting from convulsions during the seizure. HOME CARE INSTRUCTIONS   If you are given medicines, take them exactly as prescribed by your health care provider.  Keep all follow-up appointments as directed by your health care provider.  Do not swim or drive or engage in risky activity during which a seizure could cause further injury to you or others until your health care provider says it is OK.  Get adequate rest.  Teach friends and family what to do if you have a seizure. They should:  Lay you on the ground to prevent a fall.  Put a cushion under your head.  Loosen any tight clothing around your neck.  Turn you on your side. If vomiting occurs, this helps keep your airway clear.  Stay with you until you recover.  Know whether or not you need emergency care. SEEK IMMEDIATE MEDICAL CARE IF:  The seizure lasts longer than 5 minutes.  The seizure is severe or you do not wake up  immediately after the seizure.  You have an altered mental status after the seizure.  You are having more frequent or worsening seizures. Someone should drive you to the emergency department or call local emergency services (911 in [place] MAKE SURE YOU:  Understand these instructions.  Will watch your condition.  Will get help right away if you are not doing well or get worse.   This information is not intended to replace advice given to you by your health care provider. Make sure you discuss any questions you have with your health care provider.   Document Released: 08/04/2000 Document Revised: 08/28/2014 Document Reviewed: 03/19/2013 Elsevier Interactive Patient Education [employer]2016 [employer] Inc.

## 2015-11-09 NOTE — ED Notes (Signed)
LAB called advised they would add Mag to blood already sent.

## 2015-11-17 ENCOUNTER — Telehealth: Payer: Self-pay | Admitting: *Deleted

## 2015-11-17 ENCOUNTER — Ambulatory Visit: Payer: BLUE CROSS/BLUE SHIELD | Admitting: Neurology

## 2015-11-17 NOTE — Telephone Encounter (Signed)
No showed new patient appointment. 

## 2015-11-18 ENCOUNTER — Encounter: Payer: Self-pay | Admitting: Neurology

## 2016-01-20 ENCOUNTER — Ambulatory Visit: Payer: BLUE CROSS/BLUE SHIELD | Admitting: Family

## 2016-01-20 ENCOUNTER — Ambulatory Visit: Payer: BLUE CROSS/BLUE SHIELD | Admitting: Adult Health

## 2016-01-21 ENCOUNTER — Ambulatory Visit (INDEPENDENT_AMBULATORY_CARE_PROVIDER_SITE_OTHER): Payer: BLUE CROSS/BLUE SHIELD | Admitting: Family

## 2016-01-21 ENCOUNTER — Ambulatory Visit: Payer: BLUE CROSS/BLUE SHIELD | Admitting: Family

## 2016-01-21 VITALS — BP 113/71 | HR 91 | Temp 99.9°F | Ht 68.0 in | Wt 168.8 lb

## 2016-01-21 DIAGNOSIS — Z79899 Other long term (current) drug therapy: Secondary | ICD-10-CM

## 2016-01-21 DIAGNOSIS — Z5181 Encounter for therapeutic drug level monitoring: Secondary | ICD-10-CM | POA: Diagnosis not present

## 2016-01-21 DIAGNOSIS — F1120 Opioid dependence, uncomplicated: Secondary | ICD-10-CM

## 2016-01-21 DIAGNOSIS — Z3490 Encounter for supervision of normal pregnancy, unspecified, unspecified trimester: Secondary | ICD-10-CM

## 2016-01-21 NOTE — Patient Instructions (Signed)
First Trimester of Pregnancy The first trimester of pregnancy is from week 1 until the end of week 12 (months 1 through 3). A week after a sperm fertilizes an egg, the egg will implant on the wall of the uterus. This embryo will begin to develop into a baby. Genes from you and your partner are forming the baby. The female genes determine whether the baby is a boy or a girl. At 6-8 weeks, the eyes and face are formed, and the heartbeat can be seen on ultrasound. At the end of 12 weeks, all the baby's organs are formed.  Now that you are pregnant, you will want to do everything you can to have a healthy baby. Two of the most important things are to get good prenatal care and to follow your health care provider's instructions. Prenatal care is all the medical care you receive before the baby's birth. This care will help prevent, find, and treat any problems during the pregnancy and childbirth. BODY CHANGES Your body goes through many changes during pregnancy. The changes vary from woman to woman.   You may gain or lose a couple of pounds at first.  You may feel sick to your stomach (nauseous) and throw up (vomit). If the vomiting is uncontrollable, call your health care provider.  You may tire easily.  You may develop headaches that can be relieved by medicines approved by your health care provider.  You may urinate more often. Painful urination may mean you have a bladder infection.  You may develop heartburn as a result of your pregnancy.  You may develop constipation because certain hormones are causing the muscles that push waste through your intestines to slow down.  You may develop hemorrhoids or swollen, bulging veins (varicose veins).  Your breasts may begin to grow larger and become tender. Your nipples may stick out more, and the tissue that surrounds them (areola) may become darker.  Your gums may bleed and may be sensitive to brushing and flossing.  Dark spots or blotches (chloasma,  mask of pregnancy) may develop on your face. This will likely fade after the baby is born.  Your menstrual periods will stop.  You may have a loss of appetite.  You may develop cravings for certain kinds of food.  You may have changes in your emotions from day to day, such as being excited to be pregnant or being concerned that something may go wrong with the pregnancy and baby.  You may have more vivid and strange dreams.  You may have changes in your hair. These can include thickening of your hair, rapid growth, and changes in texture. Some women also have hair loss during or after pregnancy, or hair that feels dry or thin. Your hair will most likely return to normal after your baby is born. WHAT TO EXPECT AT YOUR PRENATAL VISITS During a routine prenatal visit:  You will be weighed to make sure you and the baby are growing normally.  Your blood pressure will be taken.  Your abdomen will be measured to track your baby's growth.  The fetal heartbeat will be listened to starting around week 10 or 12 of your pregnancy.  Test results from any previous visits will be discussed. Your health care provider may ask you:  How you are feeling.  If you are feeling the baby move.  If you have had any abnormal symptoms, such as leaking fluid, bleeding, severe headaches, or abdominal cramping.  If you are using any tobacco products,   including cigarettes, chewing tobacco, and electronic cigarettes.  If you have any questions. Other tests that may be performed during your first trimester include:  Blood tests to find your blood type and to check for the presence of any previous infections. They will also be used to check for low iron levels (anemia) and Rh antibodies. Later in the pregnancy, blood tests for diabetes will be done along with other tests if problems develop.  Urine tests to check for infections, diabetes, or protein in the urine.  An ultrasound to confirm the proper growth  and development of the baby.  An amniocentesis to check for possible genetic problems.  Fetal screens for spina bifida and Down syndrome.  You may need other tests to make sure you and the baby are doing well.  HIV (human immunodeficiency virus) testing. Routine prenatal testing includes screening for HIV, unless you choose not to have this test. HOME CARE INSTRUCTIONS  Medicines  Follow your health care provider's instructions regarding medicine use. Specific medicines may be either safe or unsafe to take during pregnancy.  Take your prenatal vitamins as directed.  If you develop constipation, try taking a stool softener if your health care provider approves. Diet  Eat regular, well-balanced meals. Choose a variety of foods, such as meat or vegetable-based protein, fish, milk and low-fat dairy products, vegetables, fruits, and whole grain breads and cereals. Your health care provider will help you determine the amount of weight gain that is right for you.  Avoid raw meat and uncooked cheese. These carry germs that can cause birth defects in the baby.  Eating four or five small meals rather than three large meals a day may help relieve nausea and vomiting. If you start to feel nauseous, eating a few soda crackers can be helpful. Drinking liquids between meals instead of during meals also seems to help nausea and vomiting.  If you develop constipation, eat more high-fiber foods, such as fresh vegetables or fruit and whole grains. Drink enough fluids to keep your urine clear or pale yellow. Activity and Exercise  Exercise only as directed by your health care provider. Exercising will help you:  Control your weight.  Stay in shape.  Be prepared for labor and delivery.  Experiencing pain or cramping in the lower abdomen or low back is a good sign that you should stop exercising. Check with your health care provider before continuing normal exercises.  Try to avoid standing for long  periods of time. Move your legs often if you must stand in one place for a long time.  Avoid heavy lifting.  Wear low-heeled shoes, and practice good posture.  You may continue to have sex unless your health care provider directs you otherwise. Relief of Pain or Discomfort  Wear a good support bra for breast tenderness.   Take warm sitz baths to soothe any pain or discomfort caused by hemorrhoids. Use hemorrhoid cream if your health care provider approves.   Rest with your legs elevated if you have leg cramps or low back pain.  If you develop varicose veins in your legs, wear support hose. Elevate your feet for 15 minutes, 3-4 times a day. Limit salt in your diet. Prenatal Care  Schedule your prenatal visits by the twelfth week of pregnancy. They are usually scheduled monthly at first, then more often in the last 2 months before delivery.  Write down your questions. Take them to your prenatal visits.  Keep all your prenatal visits as directed by your   health care provider. Safety  Wear your seat belt at all times when driving.  Make a list of emergency phone numbers, including numbers for family, friends, the hospital, and police and fire departments. General Tips  Ask your health care provider for a referral to a local prenatal education class. Begin classes no later than at the beginning of month 6 of your pregnancy.  Ask for help if you have counseling or nutritional needs during pregnancy. Your health care provider can offer advice or refer you to specialists for help with various needs.  Do not use hot tubs, steam rooms, or saunas.  Do not douche or use tampons or scented sanitary pads.  Do not cross your legs for long periods of time.  Avoid cat litter boxes and soil used by cats. These carry germs that can cause birth defects in the baby and possibly loss of the fetus by miscarriage or stillbirth.  Avoid all smoking, herbs, alcohol, and medicines not prescribed by  your health care provider. Chemicals in these affect the formation and growth of the baby.  Do not use any tobacco products, including cigarettes, chewing tobacco, and electronic cigarettes. If you need help quitting, ask your health care provider. You may receive counseling support and other resources to help you quit.  Schedule a dentist appointment. At home, brush your teeth with a soft toothbrush and be gentle when you floss. SEEK MEDICAL CARE IF:   You have dizziness.  You have mild pelvic cramps, pelvic pressure, or nagging pain in the abdominal area.  You have persistent nausea, vomiting, or diarrhea.  You have a bad smelling vaginal discharge.  You have pain with urination.  You notice increased swelling in your face, hands, legs, or ankles. SEEK IMMEDIATE MEDICAL CARE IF:   You have a fever.  You are leaking fluid from your vagina.  You have spotting or bleeding from your vagina.  You have severe abdominal cramping or pain.  You have rapid weight gain or loss.  You vomit blood or material that looks like coffee grounds.  You are exposed to German measles and have never had them.  You are exposed to fifth disease or chickenpox.  You develop a severe headache.  You have shortness of breath.  You have any kind of trauma, such as from a fall or a car accident.   This information is not intended to replace advice given to you by your health care provider. Make sure you discuss any questions you have with your health care provider.   Document Released: 08/01/2001 Document Revised: 08/28/2014 Document Reviewed: 06/17/2013 Elsevier Interactive Patient Education 2016 [employer]  

## 2016-01-21 NOTE — Progress Notes (Signed)
   Subjective:    Patient ID: Denise Sandoval, female    DOB: 01-29-1996, 20 y.o.   MRN: [id number]  HPI PT presents to the office today states she is 2-[redacted] weeks pregnant and was on suboxone, but it was changed to subutex because of her pregnancy. PT states she needs a referral to Dr. Gerilyn Pilgrimoonquah and that she already has appt with him on Monday. Pt was given rx of subtex for one week. Pt denies any headache, palpitations, SOB, or edema at this time.     Review of Systems  All other systems reviewed and are negative.      Objective:   Physical Exam  Constitutional: She is oriented to person, place, and time. She appears well-developed and well-nourished. No distress.  Eyes: Pupils are equal, round, and reactive to light.  Neck: Normal range of motion. Neck supple. No thyromegaly present.  Cardiovascular: Normal rate, regular rhythm, normal heart sounds and intact distal pulses.   No murmur heard. Pulmonary/Chest: Effort normal and breath sounds normal. No respiratory distress. She has no wheezes.  Abdominal: Soft. Bowel sounds are normal. She exhibits no distension. There is no tenderness.  Musculoskeletal: Normal range of motion. She exhibits no edema or tenderness.  Neurological: She is alert and oriented to person, place, and time.  Skin: Skin is warm and dry.  Psychiatric: She has a normal mood and affect. Her behavior is normal. Judgment and thought content normal.  Vitals reviewed.   BP 113/71 mmHg  Pulse 91  Temp(Src) 99.9 F (37.7 C) (Oral)  Ht 5\' 8"  (1.727 m)  Wt 168 lb 12.8 oz (76.567 kg)  BMI 25.67 kg/m2  LMP 12/22/2015       Assessment & Plan:  1. Opioid use disorder, moderate, dependence (HCC) - Ambulatory referral to Pain Clinic  2. Pregnancy - Ambulatory referral to Pain Clinic  3. Encounter for monitoring Subutex maintenance therapy - Ambulatory referral to Pain Clinic  Keep appt with Pain Clinic Discussed importance to avoid alcohol, drugs, and smoking    Pt to schedule OB appt RTO prn   Jannifer Rodneyhristy Hawks, FNP

## 2016-03-24 ENCOUNTER — Other Ambulatory Visit: Payer: Self-pay | Admitting: Obstetrics & Gynecology

## 2016-03-24 DIAGNOSIS — O3680X0 Pregnancy with inconclusive fetal viability, not applicable or unspecified: Secondary | ICD-10-CM

## 2016-03-27 ENCOUNTER — Other Ambulatory Visit: Payer: Self-pay | Admitting: Obstetrics & Gynecology

## 2016-03-27 ENCOUNTER — Ambulatory Visit (INDEPENDENT_AMBULATORY_CARE_PROVIDER_SITE_OTHER): Payer: Medicaid Other

## 2016-03-27 DIAGNOSIS — O3680X0 Pregnancy with inconclusive fetal viability, not applicable or unspecified: Secondary | ICD-10-CM

## 2016-03-27 DIAGNOSIS — Z3A15 15 weeks gestation of pregnancy: Secondary | ICD-10-CM | POA: Diagnosis not present

## 2016-03-27 NOTE — Progress Notes (Signed)
US 14+6 wks,single IUP,pos fht 153,normal ov's bilat,post pl gr 0

## 2016-04-01 ENCOUNTER — Encounter (HOSPITAL_COMMUNITY): Payer: Self-pay | Admitting: Emergency Medicine

## 2016-04-01 ENCOUNTER — Emergency Department (HOSPITAL_COMMUNITY): Payer: Medicaid Other

## 2016-04-01 ENCOUNTER — Emergency Department (HOSPITAL_COMMUNITY)
Admission: EM | Admit: 2016-04-01 | Discharge: 2016-04-01 | Disposition: A | Discharge status: Home / Self Care | Payer: Medicaid Other | Attending: Emergency Medicine | Admitting: Emergency Medicine

## 2016-04-01 DIAGNOSIS — O26892 Other specified pregnancy related conditions, second trimester: Secondary | ICD-10-CM | POA: Diagnosis present

## 2016-04-01 DIAGNOSIS — Z87891 Personal history of nicotine dependence: Secondary | ICD-10-CM | POA: Insufficient documentation

## 2016-04-01 DIAGNOSIS — Z79899 Other long term (current) drug therapy: Secondary | ICD-10-CM | POA: Diagnosis not present

## 2016-04-01 DIAGNOSIS — Z791 Long term (current) use of non-steroidal anti-inflammatories (NSAID): Secondary | ICD-10-CM | POA: Insufficient documentation

## 2016-04-01 DIAGNOSIS — Z3A15 15 weeks gestation of pregnancy: Secondary | ICD-10-CM | POA: Diagnosis not present

## 2016-04-01 DIAGNOSIS — N200 Calculus of kidney: Secondary | ICD-10-CM | POA: Diagnosis not present

## 2016-04-01 DIAGNOSIS — O2342 Unspecified infection of urinary tract in pregnancy, second trimester: Secondary | ICD-10-CM | POA: Insufficient documentation

## 2016-04-01 DIAGNOSIS — N390 Urinary tract infection, site not specified: Secondary | ICD-10-CM

## 2016-04-01 DIAGNOSIS — B3741 Candidal cystitis and urethritis: Secondary | ICD-10-CM

## 2016-04-01 HISTORY — DX: Opioid use, unspecified, uncomplicated: F11.90

## 2016-04-01 LAB — URINALYSIS, ROUTINE W REFLEX MICROSCOPIC
BILIRUBIN URINE: NEGATIVE
Glucose, UA: 100 mg/dL — AB
Hgb urine dipstick: NEGATIVE
Ketones, ur: NEGATIVE mg/dL
NITRITE: POSITIVE — AB
Protein, ur: NEGATIVE mg/dL
Specific Gravity, Urine: <1.005 — ABNORMAL LOW (ref 1.005–1.030)
pH: 6 (ref 5.0–8.0)

## 2016-04-01 LAB — CBC WITH DIFFERENTIAL/PLATELET
BASOS ABS: 0 10*3/uL (ref 0.0–0.1)
Basophils Relative: 0 %
EOS PCT: 1 %
Eosinophils Absolute: 0.1 10*3/uL (ref 0.0–0.7)
HCT: 27.7 % — ABNORMAL LOW (ref 36.0–46.0)
Hemoglobin: 9.7 g/dL — ABNORMAL LOW (ref 12.0–15.0)
LYMPHS ABS: 1.7 10*3/uL (ref 0.7–4.0)
LYMPHS PCT: 15 %
MCH: 31 pg (ref 26.0–34.0)
MCHC: 35 g/dL (ref 30.0–36.0)
MCV: 88.5 fL (ref 78.0–100.0)
MONO ABS: 1.7 10*3/uL — AB (ref 0.1–1.0)
Monocytes Relative: 15 %
Neutro Abs: 7.6 10*3/uL (ref 1.7–7.7)
Neutrophils Relative %: 69 %
Platelets: 256 10*3/uL (ref 150–400)
RBC: 3.13 MIL/uL — ABNORMAL LOW (ref 3.87–5.11)
RDW: 13.6 % (ref 11.5–15.5)
WBC: 11 10*3/uL — ABNORMAL HIGH (ref 4.0–10.5)

## 2016-04-01 LAB — COMPREHENSIVE METABOLIC PANEL
ALBUMIN: 3.2 g/dL — AB (ref 3.5–5.0)
ALK PHOS: 65 U/L (ref 38–126)
ALT: 33 U/L (ref 14–54)
AST: 48 U/L — AB (ref 15–41)
Anion gap: 5 (ref 5–15)
BILIRUBIN TOTAL: 0.6 mg/dL (ref 0.3–1.2)
BUN: 6 mg/dL (ref 6–20)
CO2: 24 mmol/L (ref 22–32)
CREATININE: 0.65 mg/dL (ref 0.44–1.00)
Calcium: 8.1 mg/dL — ABNORMAL LOW (ref 8.9–10.3)
Chloride: 107 mmol/L (ref 101–111)
GFR calc Af Amer: >60 mL/min (ref >60)
GLUCOSE: 87 mg/dL (ref 65–99)
Potassium: 3.6 mmol/L (ref 3.5–5.1)
Sodium: 136 mmol/L (ref 135–145)
TOTAL PROTEIN: 6.5 g/dL (ref 6.5–8.1)

## 2016-04-01 LAB — URINE MICROSCOPIC-ADD ON

## 2016-04-01 MED ORDER — DEXTROSE 5 % IV SOLN
1.0000 g | Freq: Once | INTRAVENOUS | Status: AC
  Administered 2016-04-01: 1 g via INTRAVENOUS
  Filled 2016-04-01: qty 10

## 2016-04-01 MED ORDER — NITROFURANTOIN MONOHYD MACRO 100 MG PO CAPS: 100.0000 mg | ORAL_CAPSULE | Freq: Two times a day (BID) | ORAL | 0 refills | Status: AC

## 2016-04-01 MED ORDER — SODIUM CHLORIDE 0.9 % IV BOLUS (SEPSIS)
1000.0000 mL | Freq: Once | INTRAVENOUS | Status: AC
  Administered 2016-04-01: 1000 mL via INTRAVENOUS

## 2016-04-01 MED ORDER — ACETAMINOPHEN 500 MG PO TABS
1000.0000 mg | ORAL_TABLET | Freq: Once | ORAL | Status: AC
  Administered 2016-04-01: 1000 mg via ORAL
  Filled 2016-04-01: qty 2

## 2016-04-01 NOTE — ED Provider Notes (Signed)
[facility]-EMERGENCY DEPT Provider Note   CSN: [id number] Arrival date & time: 04/01/16  1412  First Provider Contact:  First MD Initiated Contact with Patient 04/01/16 1506        History   Chief Complaint Chief Complaint  Patient presents with  . Flank Pain    HPI Denise Sandoval is a 20 y.o. female.  The patient is a 20 year old female, she has a known history of opiate abuse, she states that she is not taking Suboxone at this time because of her pregnancy however she did have a visit in June at her doctor's office where she asked for recommendations to be referred to local neurology for chronic pain control related to her opiate abuse. She reports that she is approximately [redacted] weeks pregnant, this was verified in the electronic medical record based on the notes from August 7 when she was seen at her OB/GYN office and had an ultrasound confirming fetal heart tones. She reports that she has had approximately 4 days of a right flank pain, she reports that he does have some radiation to the lower abdomen but is mostly in the flank, she reports that she urinated in the shower yesterday and had some blood and a small kidney stone that came out and was flushed down the drain. She denies dysuria though she does endorse having intermittent fevers as high as 100.5 but has not had one today. She has been eating and drinking, she denies rashes, chest pain, cough or shortness of breath and has been taking ibuprofen without relief. She recently found out that this is not safe for early pregnancy and thus stopped using ibuprofen.      Past Medical History:  Diagnosis Date  . Anxiety   . Depression   . Kidney infection   . Opiate use   . Postpartum endometritis 05/13/2014  . RLQ abdominal pain 05/13/2014  . Supervision of normal first pregnancy in first trimester 09/23/2013    Patient Active Problem List   Diagnosis Date Noted  . MDD (major depressive disorder), recurrent severe, without psychosis  (HCC) 04/30/2015  . Alcohol use disorder, moderate, dependence (HCC) 04/30/2015  . Opioid use disorder, moderate, dependence (HCC) 04/30/2015  . Moderate benzodiazepine use disorder 04/30/2015  . Major depressive disorder (HCC) 04/25/2015  . Polysubstance dependence including opioid drug with daily use (HCC) 01/16/2015  . Post traumatic stress disorder (PTSD) 01/16/2015  . Postpartum depression 06/16/2014  . RLQ abdominal pain 05/13/2014  . Postpartum endometritis 05/13/2014  . Active labor at term 05/06/2014  . Chlamydia infection during pregnancy, antepartum 04/22/2014  . Pyelonephritis affecting pregnancy in third trimester, antepartum 02/28/2014  . Marijuana use 01/14/2014  . Rubella non-immune status, antepartum 01/14/2014  . Rh negative state in antepartum period 01/14/2014  . TSS (toxic shock syndrome) (HCC) 03/28/2012  . ARF (acute renal failure) (HCC) 03/26/2012  . Fever of unknown origin 03/24/2012  . Tachycardia 03/24/2012  . Leukocytosis 03/24/2012  . Hypokalemia 03/24/2012  . Abnormal LFTs 03/24/2012    Past Surgical History:  Procedure Laterality Date  . NO PAST SURGERIES      OB History    Gravida Para Term Preterm AB Living   SAB TAB Ectopic Multiple Live Births           1       Home Medications    Prior to Admission medications   Medication Sig Start Date End Date Taking? Authorizing Provider  ibuprofen (  ADVIL,MOTRIN) 200 MG tablet Take 200 mg by mouth once as needed for moderate pain.   Yes Historical Provider, MD  Prenatal Vit-Fe Fumarate-FA (PRENATAL MULTIVITAMIN) TABS tablet Take 1 tablet by mouth daily at 12 noon.   Yes Historical Provider, MD  nitrofurantoin, macrocrystal-monohydrate, (MACROBID) 100 MG capsule Take 1 capsule (100 mg total) by mouth 2 (two) times daily. 04/01/16 04/08/16  Eber HongBrian Blen Ransome, MD    Family History Family History  Problem Relation Age of Onset  . Alcohol abuse Mother   . Alcohol abuse Father   .  Hypertension Maternal Grandmother   . Alcohol abuse Maternal Grandfather   . Alcohol abuse Paternal Grandfather     Social History Social History  Substance Use Topics  . Smoking status: Former Smoker    Packs/day: 0.50    Years: 3.00    Types: Cigarettes    Quit date: 03/11/2016  . Smokeless tobacco: Never Used  . Alcohol use No     Allergies   Review of patient's allergies indicates no known allergies.   Review of Systems Review of Systems  All other systems reviewed and are negative.    Physical Exam Updated Vital Signs BP 94/56 (BP Location: Left Arm)   Pulse 73   Resp 16   LMP 12/20/2015 (Approximate)   SpO2 98%   Physical Exam  Constitutional: She appears well-developed and well-nourished. No distress.  HENT:  Head: Normocephalic and atraumatic.  Mouth/Throat: Oropharynx is clear and moist. No oropharyngeal exudate.  Eyes: Conjunctivae and EOM are normal. Pupils are equal, round, and reactive to light. Right eye exhibits no discharge. Left eye exhibits no discharge. No scleral icterus.  Neck: Normal range of motion. Neck supple. No JVD present. No thyromegaly present.  Cardiovascular: Normal rate, regular rhythm, normal heart sounds and intact distal pulses.  Exam reveals no gallop and no friction rub.   No murmur heard. Pulmonary/Chest: Effort normal and breath sounds normal. No respiratory distress. She has no wheezes. She has no rales.  Abdominal: Soft. Bowel sounds are normal. She exhibits no distension and no mass. There is no tenderness ( The patient reports tender to palpation over the right CVA).  Musculoskeletal: Normal range of motion. She exhibits no edema or tenderness.  Lymphadenopathy:    She has no cervical adenopathy.  Neurological: She is alert. Coordination normal.  Skin: Skin is warm and dry. No rash noted. No erythema.  Psychiatric: She has a normal mood and affect. Her behavior is normal.  Nursing note and vitals reviewed.    ED  Treatments / Results  Labs (all labs ordered are listed, but only abnormal results are displayed) Labs Reviewed  URINALYSIS, ROUTINE W REFLEX MICROSCOPIC (NOT AT [facility]) - Abnormal; Notable for the following:       Result Value   Color, Urine AMBER (*)    APPearance CLOUDY (*)    Specific Gravity, Urine <1.005 (*)    Glucose, UA 100 (*)    Nitrite POSITIVE (*)    Leukocytes, UA MODERATE (*)    All other components within normal limits  URINE MICROSCOPIC-ADD ON - Abnormal; Notable for the following:    Squamous Epithelial / LPF TOO NUMEROUS TO COUNT (*)    Bacteria, UA MANY (*)    All other components within normal limits  CBC WITH DIFFERENTIAL/PLATELET - Abnormal; Notable for the following:    WBC 11.0 (*)    RBC 3.13 (*)    Hemoglobin 9.7 (*)    HCT 27.7 (*)  Monocytes Absolute 1.7 (*)    All other components within normal limits  COMPREHENSIVE METABOLIC PANEL - Abnormal; Notable for the following:    Calcium 8.1 (*)    Albumin 3.2 (*)    AST 48 (*)    All other components within normal limits  URINE CULTURE     Overall the patient is well-appearing however she does report symptoms that could be consistent with either kidney stone or pyelonephritis.  We will avoid CT scan due to pregnancy, labs and urinalysis pending, avoid non-Tylenol medications until we know that this is a definite pathology that needs strong prescription pain medication use.  Radiology US Renal  Result Date: 04/01/2016 CLINICAL DATA:  RIGHT flank pain, pregnant, question kidney stone EXAM: RENAL / URINARY TRACT ULTRASOUND COMPLETE COMPARISON:  Abdominal ultrasound 03/25/2012 FINDINGS: Right Kidney: Length: 13.5 cm. Normal cortical thickness and echogenicity. No mass, hydronephrosis or shadowing calcification. Left Kidney: Length: 13.4 cm. Normal cortical thickness and echogenicity. No mass, hydronephrosis or shadowing calcification. Bladder: Normally distended without mass or wall thickening. LEFT ureteral  jet visualized. RIGHT ureteral jet was not visualized during imaging. IMPRESSION: Normal sonographic appearance of the kidneys bilaterally. No RIGHT ureteral jet was visualized during imaging, but no RIGHT-side hydronephrosis is identified. Electronically Signed   By: Ulyses Southward M.D.   On: 04/01/2016 18:08    Procedures Procedures (including critical care time)  Medications Ordered in ED Medications  sodium chloride 0.9 % bolus 1,000 mL (0 mLs Intravenous Stopped 04/01/16 1901)  cefTRIAXone (ROCEPHIN) 1 g in dextrose 5 % 50 mL IVPB (0 g Intravenous Stopped 04/01/16 1715)  acetaminophen (TYLENOL) tablet 1,000 mg (1,000 mg Oral Given 04/01/16 1710)     Initial Impression / Assessment and Plan / ED Course  I have reviewed the triage vital signs and the nursing notes.  Pertinent labs & imaging results that were available during my care of the patient were reviewed by me and considered in my medical decision making (see chart for details).  Clinical Course  Comment By Time  UA reveals nitrite, LE, and many bacteria as well as Yeast - has been on antibiotic through her last pregnancy constantly - on Keflex at that time - no recetn Abx. Eber Hong, MD 08/12 1627  D/w pharmacist at [facility] - recommends Eber Hong, MD 08/12 [phone]   Pharmacists recommends no specific meds for antifungals and diflucan is category D on micromedex - can seen OBGYN to work through this as outpt - macrobid for home Has been given fluiids,.  EMERGENCY DEPARTMENT US RENAL EXAM  "Study: Limited Retroperitoneal Ultrasound of Kidneys"  INDICATIONS: Flank pain  Long and short axis of both kidneys were obtained.   PERFORMED BY: Myself  IMAGES ARCHIVED?: Yes  LIMITATIONS: Body habitus  VIEWS USED: Long axis and Short axis   INTERPRETATION: Right Hydronephrosis mild   CPT Code: 11914-78 (limited retroperitoneal)    Final Clinical Impressions(s) / ED Diagnoses   Final diagnoses:  UTI (lower  urinary tract infection)  Yeast cystitis    New Prescriptions Discharge Medication List as of 04/01/2016  7:07 PM       Eber Hong, MD 04/01/16 1919

## 2016-04-01 NOTE — ED Triage Notes (Signed)
Patient c/o right flank pain x2 weeks that is progressively getting worse. Per patient pain with urination, frequency, and nausea. Patient reports passing "what appeared to be a kidney stone with some blood in urine last night." Patient reports kidney stones with pregnancy. Patient is [redacted] weeks pregnant and [facility]. Denies any vaginal bleeding or discharge. Per patient fever x4 days in which she has been taking ibuprofen. Patient advised not to take ibuprofen during pregnancy.

## 2016-04-01 NOTE — Discharge Instructions (Signed)
Macrobid twice daily for 7 days See your OBGYN in 3 days for recheck You may need at repeat urinalysis if no better Tylenol for pain or fever Return for worsening symptoms.

## 2016-04-03 LAB — URINE CULTURE

## 2016-04-04 ENCOUNTER — Telehealth: Payer: Self-pay | Admitting: *Deleted

## 2016-04-04 ENCOUNTER — Encounter: Payer: Medicaid Other | Admitting: Advanced Practice Midwife

## 2016-04-04 NOTE — Telephone Encounter (Signed)
Post ED Visit - Positive Culture Follow-up  Culture report reviewed by antimicrobial stewardship pharmacist:  []  Enzo BiNathan Batchelder, Pharm.D. []  Celedonio MiyamotoJeremy Frens, Pharm.D., BCPS []  Garvin FilaMike Maccia, Pharm.D. []  Georgina PillionElizabeth Martin, Pharm.D., BCPS []  [place] FallsMinh Pham, [address]Ph[address]D., BCPS, AAHIVP []  Estella HuskMichelle Turner, Pharm.D., BCPS, AAHIVP []  Tennis Mustassie Stewart, [address]D. []  Rob Oswaldo DoneVincent, [address] Vianne BullsKai Kong, RPh/Serena Sam PA-C  Positive urine culture Treated with Nitrofurantion Monohyd Macro, organism sensitive to the same and no further patient follow-up is required at this time.  Virl AxeRobertson, Emilya Justen [facility] 04/04/2016, 2:47 PM

## 2016-04-12 ENCOUNTER — Encounter: Payer: Self-pay | Admitting: Women's Health

## 2016-04-12 ENCOUNTER — Other Ambulatory Visit (HOSPITAL_COMMUNITY)
Admission: RE | Admit: 2016-04-12 | Discharge: 2016-04-12 | Disposition: A | Discharge status: Home / Self Care | Payer: Medicaid Other | Source: Ambulatory Visit | Attending: Advanced Practice Midwife | Admitting: Advanced Practice Midwife

## 2016-04-12 ENCOUNTER — Ambulatory Visit (INDEPENDENT_AMBULATORY_CARE_PROVIDER_SITE_OTHER): Payer: Medicaid Other | Admitting: Women's Health

## 2016-04-12 ENCOUNTER — Encounter: Payer: Medicaid Other | Admitting: Advanced Practice Midwife

## 2016-04-12 VITALS — BP 116/66 | HR 96 | Wt 157.0 lb

## 2016-04-12 DIAGNOSIS — Z3A18 18 weeks gestation of pregnancy: Secondary | ICD-10-CM

## 2016-04-12 DIAGNOSIS — Z3492 Encounter for supervision of normal pregnancy, unspecified, second trimester: Secondary | ICD-10-CM

## 2016-04-12 DIAGNOSIS — Z363 Encounter for antenatal screening for malformations: Secondary | ICD-10-CM

## 2016-04-12 DIAGNOSIS — Z113 Encounter for screening for infections with a predominantly sexual mode of transmission: Secondary | ICD-10-CM | POA: Insufficient documentation

## 2016-04-12 DIAGNOSIS — O99342 Other mental disorders complicating pregnancy, second trimester: Secondary | ICD-10-CM

## 2016-04-12 DIAGNOSIS — Z3490 Encounter for supervision of normal pregnancy, unspecified, unspecified trimester: Secondary | ICD-10-CM | POA: Insufficient documentation

## 2016-04-12 DIAGNOSIS — O0932 Supervision of pregnancy with insufficient antenatal care, second trimester: Secondary | ICD-10-CM

## 2016-04-12 DIAGNOSIS — Z3482 Encounter for supervision of other normal pregnancy, second trimester: Secondary | ICD-10-CM | POA: Diagnosis not present

## 2016-04-12 DIAGNOSIS — Z369 Encounter for antenatal screening, unspecified: Secondary | ICD-10-CM

## 2016-04-12 DIAGNOSIS — Z01419 Encounter for gynecological examination (general) (routine) without abnormal findings: Secondary | ICD-10-CM | POA: Diagnosis present

## 2016-04-12 DIAGNOSIS — Z0283 Encounter for blood-alcohol and blood-drug test: Secondary | ICD-10-CM

## 2016-04-12 DIAGNOSIS — O0930 Supervision of pregnancy with insufficient antenatal care, unspecified trimester: Secondary | ICD-10-CM

## 2016-04-12 DIAGNOSIS — Z1389 Encounter for screening for other disorder: Secondary | ICD-10-CM

## 2016-04-12 DIAGNOSIS — Z331 Pregnant state, incidental: Secondary | ICD-10-CM

## 2016-04-12 NOTE — Progress Notes (Signed)
Subjective:  Denise CrazierKaitlyn Sandoval is a 20 y.o. 322P1001 Caucasian female at 4455w1d by 14wk u/s, being seen today for her first obstetrical visit.  Her obstetrical history is significant for term uncomplicated svb x 1, prev smoker- quit w/ +PT, subutex therapy- sees [place] q 2wks- current dosage 12mg  daily, late care @ 17wks.  Pregnancy history fully reviewed. Depression/anxiety- was on zoloft 150mg , latuda-unsure of dosage, and ativan prior to pregnancy, rx'd by [facility] in [place]- quit w/ +PT b/c she was unsure if safe w/ pregnancy- feels like she needs to be back on them. Does not currently have therapist/counselor- [facility].   Patient reports vaginal itching/discharge after taking macrobid for uti rx'd by ED. Denies vb, cramping, uti s/s, abnormal/malodorous vag d/c, or vulvovaginal itching/irritation.  BP 116/66   Pulse 96   Wt 157 lb (71.2 kg)   LMP 03/27/2016   BMI 23.87 kg/m   HISTORY: OB History  Gravida Para Term Preterm AB Living  2 1 1     1   SAB TAB Ectopic Multiple Live Births          1    # Outcome Date GA Lbr Len/2nd Weight Sex Delivery Anes PTL Lv  2 Current           1 Term 05/07/14 111w3d 05:38 / 01:16 8 lb 6.4 oz (3.81 kg) F Vag-Spont EPI N LIV     Past Medical History:  Diagnosis Date  . Anxiety   . Depression   . Kidney infection   . Opiate use   . Postpartum endometritis 05/13/2014  . RLQ abdominal pain 05/13/2014  . Supervision of normal first pregnancy in first trimester 09/23/2013   Past Surgical History:  Procedure Laterality Date  . NO PAST SURGERIES     Family History  Problem Relation Age of Onset  . Alcohol abuse Mother   . Alcohol abuse Father   . Hypertension Maternal Grandmother   . Alcohol abuse Maternal Grandfather   . Alcohol abuse Paternal Grandfather   . Cancer - Lung Maternal Aunt     Exam   System:     General: Well developed & nourished, no acute distress   Skin: Warm & dry, normal coloration and turgor, no  rashes   Neurologic: Alert & oriented, normal mood   Cardiovascular: Regular rate & rhythm   Respiratory: Effort & rate normal, LCTAB, acyanotic   Abdomen: Soft, non tender   Extremities: normal strength, tone   Pelvic Exam:    Perineum: Normal perineum   Vulva: Normal, no lesions   Vagina:  Normal mucosa, normal discharge   Cervix: Normal, bulbous, appears closed   Uterus: Normal size/shape/contour for GA   Thin prep pap smear obtained today w/ reflex high risk HPV cotesting FHR: 140 via doppler   Assessment:   Pregnancy: G2P1001 Patient Active Problem List   Diagnosis Date Noted  . Supervision of normal pregnancy 04/12/2016    Priority: High  . Postpartum depression 06/16/2014    Priority: High  . Chlamydia infection during pregnancy, antepartum 04/22/2014    Priority: High  . Marijuana use 01/14/2014    Priority: High  . Rubella non-immune status, antepartum 01/14/2014    Priority: High  . Rh negative state in antepartum period 01/14/2014    Priority: High  . MDD (major depressive disorder), recurrent severe, without psychosis (HCC) 04/30/2015  . Alcohol use disorder, moderate, dependence (HCC) 04/30/2015  . Opioid use disorder, moderate, dependence (HCC) 04/30/2015  .  Moderate benzodiazepine use disorder 04/30/2015  . Major depressive disorder (HCC) 04/25/2015  . Polysubstance dependence including opioid drug with daily use (HCC) 01/16/2015  . Post traumatic stress disorder (PTSD) 01/16/2015  . RLQ abdominal pain 05/13/2014  . Postpartum endometritis 05/13/2014  . TSS (toxic shock syndrome) (HCC) 03/28/2012  . ARF (acute renal failure) (HCC) 03/26/2012  . Fever of unknown origin 03/24/2012  . Tachycardia 03/24/2012  . Leukocytosis 03/24/2012  . Hypokalemia 03/24/2012  . Abnormal LFTs 03/24/2012    617w1d G2P1001 New OB visit Depression/anxiety Subutex therapy Late care  Plan:  Initial labs drawn Continue prenatal vitamins Problem list reviewed and  updated Reviewed n/v relief measures and warning s/s to report Reviewed recommended weight gain based on pre-gravid BMI Encouraged well-balanced diet Genetic Screening discussed Quad Screen: requested Cystic fibrosis screening discussed neg prev preg Ultrasound discussed; fetal survey: requested Follow up in 3 weeks for visit and anatomy u/s [facility] today OK to resume zoloft- have pharmacy send refill request, will check on Latuda, no ativan for now  Marge DuncansBooker, Anne-Marie Genson Randall CNM, [facility]NP-BC 04/12/2016 5:01 PM

## 2016-04-12 NOTE — Patient Instructions (Signed)

## 2016-04-14 LAB — URINALYSIS, ROUTINE W REFLEX MICROSCOPIC
Bilirubin, UA: NEGATIVE
Glucose, UA: NEGATIVE
Ketones, UA: NEGATIVE
Nitrite, UA: NEGATIVE
Protein, UA: NEGATIVE
RBC, UA: NEGATIVE
Urobilinogen, Ur: 0.2 mg/dL (ref 0.2–1.0)
pH, UA: 7 (ref 5.0–7.5)

## 2016-04-14 LAB — AFP, QUAD SCREEN
DIA MOM VALUE: 2.35
DIA VALUE (EIA): 396.76 pg/mL
DSR (By Age)    1 IN: 1106
DSR (SECOND TRIMESTER) 1 IN: 740
GESTATIONAL AGE AFP: 17.1 wk
MSAFP Mom: 0.89
MSAFP: 32.9 ng/mL
MSHCG Mom: 0.93
MSHCG: 28854 m[IU]/mL
Maternal Age At EDD: 22.9 YEARS
OSB RISK: 10000
PDF: 0
Test Results:: NEGATIVE
UE3 MOM: 0.78
Weight: 157 [lb_av]
uE3 Value: 0.83 ng/mL

## 2016-04-14 LAB — PMP SCREEN PROFILE (10S), URINE
Amphetamine Screen, Ur: NEGATIVE ng/mL
Barbiturate Screen, Ur: NEGATIVE ng/mL
Benzodiazepine Screen, Urine: POSITIVE ng/mL
COCAINE(METAB.) SCREEN, URINE: NEGATIVE ng/mL
Cannabinoids Ur Ql Scn: POSITIVE ng/mL
Creatinine(Crt), U: 9.2 mg/dL — ABNORMAL LOW (ref 20.0–300.0)
METHADONE SCREEN, URINE: NEGATIVE ng/mL
Opiate Scrn, Ur: NEGATIVE ng/mL
Oxycodone+Oxymorphone Ur Ql Scn: NEGATIVE ng/mL
PCP Scrn, Ur: NEGATIVE ng/mL
PROPOXYPHENE SCREEN: NEGATIVE ng/mL
Ph of Urine: 6.6 (ref 4.5–8.9)

## 2016-04-14 LAB — MICROSCOPIC EXAMINATION
Bacteria, UA: NONE SEEN
CASTS: NONE SEEN /LPF

## 2016-04-14 LAB — RUBELLA SCREEN: Rubella Antibodies, IGG: <0.9 index — ABNORMAL LOW (ref >0.99)

## 2016-04-14 LAB — URINE CULTURE

## 2016-04-14 LAB — CBC
Hematocrit: 33.3 % — ABNORMAL LOW (ref 34.0–46.6)
Hemoglobin: 11.1 g/dL (ref 11.1–15.9)
MCH: 30.1 pg (ref 26.6–33.0)
MCHC: 33.3 g/dL (ref 31.5–35.7)
MCV: 90 fL (ref 79–97)
Platelets: 408 x10E3/uL — ABNORMAL HIGH (ref 150–379)
RBC: 3.69 x10E6/uL — ABNORMAL LOW (ref 3.77–5.28)
RDW: 14.3 % (ref 12.3–15.4)
WBC: 10.2 x10E3/uL (ref 3.4–10.8)

## 2016-04-14 LAB — ABO/RH: Rh Factor: NEGATIVE

## 2016-04-14 LAB — VARICELLA ZOSTER ANTIBODY, IGG: Varicella zoster IgG: 238 {index}

## 2016-04-14 LAB — HIV ANTIBODY (ROUTINE TESTING W REFLEX): HIV Screen 4th Generation wRfx: NONREACTIVE

## 2016-04-14 LAB — RPR: RPR Ser Ql: NONREACTIVE

## 2016-04-14 LAB — SPECIFIC GRAVITY: SPECIFIC GRAVITY: 1.0012

## 2016-04-14 LAB — HEPATITIS B SURFACE ANTIGEN: Hepatitis B Surface Ag: NEGATIVE

## 2016-04-14 LAB — ANTIBODY SCREEN: Antibody Screen: NEGATIVE

## 2016-04-17 DIAGNOSIS — Z8619 Personal history of other infectious and parasitic diseases: Secondary | ICD-10-CM | POA: Insufficient documentation

## 2016-04-17 DIAGNOSIS — O09899 Supervision of other high risk pregnancies, unspecified trimester: Secondary | ICD-10-CM | POA: Insufficient documentation

## 2016-04-17 DIAGNOSIS — O0930 Supervision of pregnancy with insufficient antenatal care, unspecified trimester: Secondary | ICD-10-CM | POA: Insufficient documentation

## 2016-04-17 LAB — CYTOLOGY - PAP

## 2016-05-03 ENCOUNTER — Ambulatory Visit (INDEPENDENT_AMBULATORY_CARE_PROVIDER_SITE_OTHER): Payer: Medicaid Other

## 2016-05-03 ENCOUNTER — Ambulatory Visit (INDEPENDENT_AMBULATORY_CARE_PROVIDER_SITE_OTHER): Payer: Medicaid Other | Admitting: Advanced Practice Midwife

## 2016-05-03 ENCOUNTER — Encounter: Payer: Self-pay | Admitting: Advanced Practice Midwife

## 2016-05-03 VITALS — BP 120/56 | HR 60 | Wt 163.0 lb

## 2016-05-03 DIAGNOSIS — Z3A21 21 weeks gestation of pregnancy: Secondary | ICD-10-CM | POA: Diagnosis not present

## 2016-05-03 DIAGNOSIS — Z36 Encounter for antenatal screening of mother: Secondary | ICD-10-CM | POA: Diagnosis not present

## 2016-05-03 DIAGNOSIS — O321XX1 Maternal care for breech presentation, fetus 1: Secondary | ICD-10-CM

## 2016-05-03 DIAGNOSIS — Z363 Encounter for antenatal screening for malformations: Secondary | ICD-10-CM

## 2016-05-03 DIAGNOSIS — Z331 Pregnant state, incidental: Secondary | ICD-10-CM

## 2016-05-03 DIAGNOSIS — Z1389 Encounter for screening for other disorder: Secondary | ICD-10-CM

## 2016-05-03 DIAGNOSIS — O99342 Other mental disorders complicating pregnancy, second trimester: Secondary | ICD-10-CM

## 2016-05-03 DIAGNOSIS — Z3492 Encounter for supervision of normal pregnancy, unspecified, second trimester: Secondary | ICD-10-CM

## 2016-05-03 DIAGNOSIS — Z3482 Encounter for supervision of other normal pregnancy, second trimester: Secondary | ICD-10-CM

## 2016-05-03 LAB — POCT URINALYSIS DIPSTICK
Blood, UA: NEGATIVE
GLUCOSE UA: NEGATIVE
Ketones, UA: NEGATIVE
LEUKOCYTES UA: NEGATIVE
NITRITE UA: NEGATIVE
Protein, UA: NEGATIVE

## 2016-05-03 NOTE — Progress Notes (Signed)
G2P1001 5693w1d Estimated Date of Delivery: 09/19/16  Blood pressure (!) 120/56, pulse 60, weight 163 lb (73.9 kg), last menstrual period 03/27/2016, unknown if currently breastfeeding.   BP weight and urine results all reviewed and noted.  Please refer to the obstetrical flow sheet for the fundal height and fetal heart rate documentation:  Anatomy scan today:  US 20+1 wks,breech,post pl,cx 4.7 cm,normal ov's bilat,fhr 144 bpm,svp of fluid 5.63 cm,efw 303 g,anatomy complete,no obvious abnormalities seen  Patient reports good fetal movement, denies any bleeding and no rupture of membranes symptoms or regular contractions. Patient is without complaints. All questions were answered.  Orders Placed This Encounter  Procedures  . POCT urinalysis dipstick    Plan:  Continued routine obstetrical care, EPIC was down during pts visit, did not see + UDS for Benzos  So it was not discussed.   Return in about 4 weeks (around 05/31/2016) for LROB.

## 2016-05-03 NOTE — Progress Notes (Signed)
US 20+1 wks,breech,post pl,cx 4.7 cm,normal ov's bilat,fhr 144 bpm,svp of fluid 5.63 cm,efw 303 g,anatomy complete,no obvious abnormalities seen

## 2016-05-31 ENCOUNTER — Encounter: Payer: Medicaid Other | Admitting: Obstetrics and Gynecology

## 2016-06-05 ENCOUNTER — Encounter: Payer: Medicaid Other | Admitting: Obstetrics and Gynecology

## 2016-06-08 ENCOUNTER — Ambulatory Visit (INDEPENDENT_AMBULATORY_CARE_PROVIDER_SITE_OTHER): Payer: Medicaid Other | Admitting: Obstetrics and Gynecology

## 2016-06-08 VITALS — BP 110/50 | HR 78 | Wt 165.0 lb

## 2016-06-08 DIAGNOSIS — Z3403 Encounter for supervision of normal first pregnancy, third trimester: Secondary | ICD-10-CM

## 2016-06-08 DIAGNOSIS — Z331 Pregnant state, incidental: Secondary | ICD-10-CM

## 2016-06-08 DIAGNOSIS — Z1389 Encounter for screening for other disorder: Secondary | ICD-10-CM

## 2016-06-08 DIAGNOSIS — F1120 Opioid dependence, uncomplicated: Secondary | ICD-10-CM

## 2016-06-08 DIAGNOSIS — Z3402 Encounter for supervision of normal first pregnancy, second trimester: Secondary | ICD-10-CM

## 2016-06-08 LAB — POCT URINALYSIS DIPSTICK
Blood, UA: NEGATIVE
GLUCOSE UA: NEGATIVE
KETONES UA: NEGATIVE
Leukocytes, UA: NEGATIVE
Nitrite, UA: NEGATIVE
Protein, UA: NEGATIVE

## 2016-06-08 MED ORDER — ONDANSETRON 4 MG PO TBDP: 4.0000 mg | ORAL_TABLET | Freq: Four times a day (QID) | ORAL | 2 refills | Status: DC | PRN

## 2016-06-08 NOTE — Progress Notes (Signed)
G2P1001 7714w2d Estimated Date of Delivery: 09/19/16 LROB  Patient reports good fetal movement, denies any bleeding and no rupture of membranes symptoms or regular contractions. Patient complaints: nausea that occurs primarily at night. Pt reports that she has tried zofran and is currently using phenergan for her nausea. Pt states that zofran has worked for her nausea in the past. Pt notes that this is her second pregnancy and that her contraceptive measures following delivery is the nexplanon. Pt denies any other symptoms.  Blood pressure (!) 110/50, pulse 78, weight 165 lb (74.8 kg), last menstrual period 03/27/2016, unknown if currently breastfeeding. refer to the ob flow sheet for FH and FHR, also BP, Wt, Urine results:notable for negative                          Physical Examination:  General appearance - alert, well appearing, and in no distress Abdomen - FH 22 cm                   -FHR 145 soft, nontender, nondistended, no masses or organomegaly                                            Questions were answered. Assessment: LROB G2P1001 @ 5614w2d  Plan:  Continued routine obstetrical care, Rx zofran. D/c phenergan use.   F/u in 4 weeks for routine OB visit  By signing my name below, I, Soijett Blue, attest that this documentation has been prepared under the direction and in the presence of Tilda BurrowJohn V Ambriana Selway, MD. Electronically Signed: Soijett Blue, ED Scribe. 06/08/16. 11:01 AM.  I personally performed the services described in this documentation, which was SCRIBED in my presence. The recorded information has been reviewed and considered accurate. It has been edited as necessary during review. Tilda BurrowFERGUSON,Kaianna Dolezal V, MD

## 2016-07-06 ENCOUNTER — Inpatient Hospital Stay (HOSPITAL_COMMUNITY)
Admission: AD | Admit: 2016-07-06 | Discharge: 2016-07-06 | Disposition: A | Discharge status: Home / Self Care | Payer: Medicaid Other | Source: Ambulatory Visit | Attending: Obstetrics & Gynecology | Admitting: Obstetrics & Gynecology

## 2016-07-06 ENCOUNTER — Encounter: Payer: Medicaid Other | Admitting: Advanced Practice Midwife

## 2016-07-06 ENCOUNTER — Encounter (HOSPITAL_COMMUNITY): Payer: Self-pay | Admitting: Advanced Practice Midwife

## 2016-07-06 DIAGNOSIS — Z79899 Other long term (current) drug therapy: Secondary | ICD-10-CM | POA: Diagnosis not present

## 2016-07-06 DIAGNOSIS — G40909 Epilepsy, unspecified, not intractable, without status epilepticus: Secondary | ICD-10-CM | POA: Insufficient documentation

## 2016-07-06 DIAGNOSIS — Z811 Family history of alcohol abuse and dependence: Secondary | ICD-10-CM | POA: Diagnosis not present

## 2016-07-06 DIAGNOSIS — Z3A29 29 weeks gestation of pregnancy: Secondary | ICD-10-CM | POA: Diagnosis not present

## 2016-07-06 DIAGNOSIS — R569 Unspecified convulsions: Secondary | ICD-10-CM

## 2016-07-06 DIAGNOSIS — Z87891 Personal history of nicotine dependence: Secondary | ICD-10-CM | POA: Diagnosis not present

## 2016-07-06 DIAGNOSIS — O99353 Diseases of the nervous system complicating pregnancy, third trimester: Secondary | ICD-10-CM | POA: Insufficient documentation

## 2016-07-06 HISTORY — DX: Unspecified convulsions: R56.9

## 2016-07-06 LAB — CBC
HCT: 30.3 % — ABNORMAL LOW (ref 36.0–46.0)
HEMOGLOBIN: 10.5 g/dL — AB (ref 12.0–15.0)
MCH: 30.6 pg (ref 26.0–34.0)
MCHC: 34.7 g/dL (ref 30.0–36.0)
MCV: 88.3 fL (ref 78.0–100.0)
Platelets: 245 10*3/uL (ref 150–400)
RBC: 3.43 MIL/uL — ABNORMAL LOW (ref 3.87–5.11)
RDW: 13.6 % (ref 11.5–15.5)
WBC: 11.8 10*3/uL — AB (ref 4.0–10.5)

## 2016-07-06 LAB — COMPREHENSIVE METABOLIC PANEL
ALBUMIN: 2.7 g/dL — AB (ref 3.5–5.0)
ALT: 17 U/L (ref 14–54)
ANION GAP: 5 (ref 5–15)
AST: 21 U/L (ref 15–41)
Alkaline Phosphatase: 78 U/L (ref 38–126)
BUN: 7 mg/dL (ref 6–20)
CHLORIDE: 107 mmol/L (ref 101–111)
CO2: 24 mmol/L (ref 22–32)
Calcium: 8.1 mg/dL — ABNORMAL LOW (ref 8.9–10.3)
Creatinine, Ser: 0.54 mg/dL (ref 0.44–1.00)
GFR calc Af Amer: >60 mL/min (ref >60)
GFR calc non Af Amer: >60 mL/min (ref >60)
GLUCOSE: 75 mg/dL (ref 65–99)
POTASSIUM: 4.2 mmol/L (ref 3.5–5.1)
SODIUM: 136 mmol/L (ref 135–145)
Total Bilirubin: 0.4 mg/dL (ref 0.3–1.2)
Total Protein: 5.7 g/dL — ABNORMAL LOW (ref 6.5–8.1)

## 2016-07-06 LAB — URINALYSIS, ROUTINE W REFLEX MICROSCOPIC
Bilirubin Urine: NEGATIVE
Glucose, UA: NEGATIVE mg/dL
HGB URINE DIPSTICK: NEGATIVE
Ketones, ur: NEGATIVE mg/dL
NITRITE: NEGATIVE
PROTEIN: NEGATIVE mg/dL
Specific Gravity, Urine: 1.02 (ref 1.005–1.030)
pH: 6 (ref 5.0–8.0)

## 2016-07-06 LAB — RAPID URINE DRUG SCREEN, HOSP PERFORMED
AMPHETAMINES: NOT DETECTED
BENZODIAZEPINES: POSITIVE — AB
Barbiturates: NOT DETECTED
Cocaine: NOT DETECTED
OPIATES: NOT DETECTED
Tetrahydrocannabinol: POSITIVE — AB

## 2016-07-06 LAB — PROTEIN / CREATININE RATIO, URINE
Creatinine, Urine: 106 mg/dL
PROTEIN CREATININE RATIO: 0.07 mg/mg{creat} (ref 0.00–0.15)
Total Protein, Urine: 7 mg/dL

## 2016-07-06 LAB — URINE MICROSCOPIC-ADD ON
Bacteria, UA: NONE SEEN
RBC / HPF: NONE SEEN RBC/hpf (ref 0–5)

## 2016-07-06 MED ORDER — LEVETIRACETAM 500 MG PO TABS
500.0000 mg | ORAL_TABLET | Freq: Once | ORAL | Status: AC
  Administered 2016-07-06: 500 mg via ORAL
  Filled 2016-07-06: qty 1

## 2016-07-06 MED ORDER — LEVETIRACETAM 500 MG PO TABS: 500.0000 mg | ORAL_TABLET | Freq: Two times a day (BID) | ORAL | 2 refills | Status: DC

## 2016-07-06 MED ORDER — LORAZEPAM 2 MG/ML IJ SOLN
2.0000 mg | Freq: Once | INTRAMUSCULAR | Status: DC | PRN
  Filled 2016-07-06: qty 1

## 2016-07-06 MED ORDER — IBUPROFEN 600 MG PO TABS
600.0000 mg | ORAL_TABLET | Freq: Once | ORAL | Status: AC
  Administered 2016-07-06: 600 mg via ORAL
  Filled 2016-07-06: qty 1

## 2016-07-06 NOTE — Discharge Instructions (Signed)
Seizure, Adult °A seizure is a sudden burst of abnormal electrical activity in the brain. The abnormal activity temporarily interrupts normal brain function, causing a person to experience any of the following: °· Involuntary movements. °· Changes in awareness or consciousness. °· Uncontrollable shaking (convulsions). ° °Seizures usually last from 30 seconds to 2 minutes. They usually do not cause permanent brain damage unless they are prolonged. °What can cause a seizure to happen? °Seizures can happen for many reasons including: °· A fever. °· Low blood sugar. °· A medicine. °· An illnesses. °· A brain injury. ° °Some people who have a seizure never have another one. People who have repeated seizures have a condition called epilepsy. °What are the symptoms of a seizure? °Symptoms of a seizure vary greatly from person to person. They include: °· Convulsions. °· Stiffening of the body. °· Involuntary movements of the arms or legs. °· Loss of consciousness. °· Breathing problems. °· Falling suddenly. °· Confusion. °· Head nodding. °· Eye blinking or fluttering. °· Lip smacking. °· Drooling. °· Rapid eye movements. °· Grunting. °· Loss of bladder control and bowel control. °· Staring. °· Unresponsiveness. ° °Some people have symptoms right before a seizure happens (aura) and right after a seizure happens. Symptoms of an aura include: °· Fear or anxiety. °· Nausea. °· Feeling like the room is spinning (vertigo). °· A feeling of having seen or heard something before (deja vu). °· Odd tastes or smells. °· Changes in vision, such as seeing flashing lights or spots. ° °Symptoms that may follow a seizure include: °· Confusion. °· Sleepiness. °· Headache. °· Weakness of one side of the body. ° °Follow these instructions at home: °Medicines ° °· Take over-the-counter and prescription medicines only as told by your health care provider. °· Avoid any substances that may prevent your medicine from working properly, such as  alcohol. °Activity °· Do not drive, swim, or do any other activities that would be dangerous if you had another seizure. Wait until your health care provider approves. °· If you live in the [place] check with your local DMV (department of motor vehicles) to find out about the local driving laws. Each state has specific rules about when you can legally return to driving. °· Get enough rest. Lack of sleep can make seizures more likely to occur. °Educating others °Teach friends and family what to do if you have a seizure. They should: °· Lay you on the ground to prevent a fall. °· Cushion your head and body. °· Loosen any tight clothing around your neck. °· Turn you on your side. If vomiting occurs, this helps keep your airway clear. °· Stay with you until you recover. °· Not hold you down. Holding you down will not stop the seizure. °· Not put anything in your mouth. °· Know whether or not you need emergency care. ° °General instructions °· Contact your health care provider each time you have a seizure. °· Avoid anything that has ever triggered a seizure for you. °· Keep a seizure diary. Record what you remember about each seizure, especially anything that might have triggered the seizure. °· Keep all follow-up visits as told by your health care provider. This is important. °Contact a health care provider if: °· You have another seizure. °· You have seizures more often. °· Your seizure symptoms change. °· You continue to have seizures with treatment. °· You have symptoms of an infection or illness. They might increase your risk of having a seizure. °Get help   seizure:  That lasts longer than 5 minutes.  That is different than previous seizures.  That leaves you unable to speak or use a part of your body.  That makes it harder to breathe.  After a head injury.  You have:  Multiple seizures in a row.  Confusion or a severe headache right after a seizure.  You are having seizures  more often.  You do not wake up immediately after a seizure.  You injure yourself during a seizure. These symptoms may represent a serious problem that is an emergency. Do not wait to see if the symptoms will go away. Get medical help right away. Call your local emergency services (911 in the [place] Do not drive yourself to the hospital.  This information is not intended to replace advice given to you by your health care provider. Make sure you discuss any questions you have with your health care provider. Document Released: 08/04/2000 Document Revised: 04/02/2016 Document Reviewed: 03/10/2016 Elsevier Interactive Patient Education  2017 [employer]   What Do I Need to Know About Injuries During Pregnancy? Injuries can happen during pregnancy. Minor falls and accidents usually do not harm you or your baby. However, any injury should be reported to your doctor. What can I do to protect myself from injuries?  Remove rugs and loose objects on the floor.  Wear comfortable shoes that have a good grip. Do not wear high-heeled shoes.  Always wear your seat belt. The lap belt should be below your belly. Always practice safe driving.  Do not ride on a motorcycle.  Do not participate in high-impact activities or sports.  Avoid:  Walking on wet or slippery floors.  Fires.  Starting fires.  Lifting heavy pots of boiling or hot liquids.  Fixing electrical problems.  Only take medicine as told by your doctor.  Know your blood type and the blood type of the baby's father.  Call your local emergency services (911 in the [place] if you are a victim of domestic violence or assault. For help and support, contact the [employer] Domestic Violence Hotline. Get help right away if:  You fall on your belly or have any high-impact accident or injury.  You have been a victim of domestic violence or any kind of violence.  You have been in a car accident.  You have bleeding from your  vagina.  Fluid is leaking from your vagina.  You start to have belly cramping (contractions) or pain.  You feel weak or pass out (faint).  You start to throw up (vomit) after an injury.  You have been burned.  You have a stiff neck or neck pain.  You get a headache or have vision problems after an injury.  You do not feel the baby move or the baby is not moving as much as normal. This information is not intended to replace advice given to you by your health care provider. Make sure you discuss any questions you have with your health care provider. Document Released: 09/09/2010 Document Revised: 01/13/2016 Document Reviewed: 05/14/2013 Elsevier Interactive Patient Education  2017 [employer]

## 2016-07-06 NOTE — MAU Provider Note (Signed)
Chief Complaint:  Seizures   First Provider Initiated Contact with Patient 07/06/16 1229     HPI: Denise Sandoval is a 20 y.o. G2P1001 at 7551w2d who presents to maternity admissions by EMS after having a seizure witnessed by her boyfriend. Pt was awake and alert, but postictal upon arrival. Possible Hx seizure disorder vs seizures from Benzo withdrawal. BF reports that pt was getting out of shower when her arms stiffened and he could tell something was wrong. She went down to the floor making mild head contact w/ the back of her head on the tub. He states her whole body was shaking for about 10 minutes and that she bit her tongue.   Hx obtained from pt (unable to answer all questions), boyfriend and EPIC records. Pt and BF report 1-2 seizures outside of pregnancy and one this pregnancy (record of ED visit 10/2015 suggested possible Benzo withdrawal at cause. No mention of previous seizures in that note.) Pt has Hx substance abuse and is on opioid maintenance. Reports having been on 2 mg Ativan per day, but stopped at positive UPT several months ago.   CNM asked pt about F/U w/ neuro after ED visit. She and BF state that she goes to Dr. Gerilyn Pilgrimoonquah who is a neurologist, but is not on seizure meds. He only manages her Subutex.    Location: [place] Quality: Sore Severity: mild Duration: <1 hour Context: After seizure/minor fall Timing: Constant Modifying factors: None, hasn't tried anything Associated signs and symptoms: Neg for vision changes, neck pain, difficulties w/ speech or gait.   Denies contractions, leakage of fluid or vaginal bleeding. Good fetal movement.   Pregnancy Course: [facility]. Seizure disorder is not on Problem List and not mentioned in OB notes. Suspect that they are not aware of this Hx.   Past Medical History:  Diagnosis Date  . Anxiety   . Depression   . Kidney infection   . Opiate use   . Postpartum endometritis 05/13/2014  . RLQ abdominal pain  05/13/2014  . Seizures (HCC)   . Supervision of normal first pregnancy in first trimester 09/23/2013   OB History  Gravida Para Term Preterm AB Living  2 1 1     1   SAB TAB Ectopic Multiple Live Births          1    # Outcome Date GA Lbr Len/2nd Weight Sex Delivery Anes PTL Lv  2 Current           1 Term 05/07/14 4218w3d 05:38 / 01:16 8 lb 6.4 oz (3.81 kg) F Vag-Spont EPI N LIV     Past Surgical History:  Procedure Laterality Date  . WISDOM TOOTH EXTRACTION     Family History  Problem Relation Age of Onset  . Alcohol abuse Mother   . Alcohol abuse Father   . Hypertension Maternal Grandmother   . Alcohol abuse Maternal Grandfather   . Alcohol abuse Paternal Grandfather   . Cancer - Lung Maternal Aunt    Social History  Substance Use Topics  . Smoking status: Former Smoker    Packs/day: 0.25    Years: 3.00    Types: Cigarettes    Quit date: 03/11/2016  . Smokeless tobacco: Never Used  . Alcohol use No   No Known Allergies Prescriptions Prior to Admission  Medication Sig Dispense Refill Last Dose  . buprenorphine (SUBUTEX) 8 MG SUBL SL tablet Place 12 mg under the tongue daily.   07/06/2016 at Unknown  time  . Prenatal Vit-Fe Fumarate-FA (PRENATAL MULTIVITAMIN) TABS tablet Take 1 tablet by mouth daily at 12 noon.   07/05/2016 at Unknown time  . ondansetron (ZOFRAN ODT) 4 MG disintegrating tablet Take 1 tablet (4 mg total) by mouth every 6 (six) hours as needed for nausea. (Patient not taking: Reported on 07/06/2016) 20 tablet 2 Not Taking at Unknown time    I have reviewed patient's Past Medical Hx, Surgical Hx, Family Hx, Social Hx, medications and allergies.   ROS:  Review of Systems  Constitutional: Negative for chills and fever.  HENT: Positive for mouth sores (Tongue injury and pain C/W biting).   Eyes: Negative for visual disturbance.  Cardiovascular: Negative for chest pain and palpitations.  Gastrointestinal: Negative for abdominal pain, constipation, diarrhea,  nausea and vomiting.  Genitourinary: Negative for vaginal bleeding.  Musculoskeletal: Negative for gait problem, neck pain and neck stiffness.  Neurological: Positive for seizures. Negative for dizziness, tremors, facial asymmetry, speech difficulty, light-headedness, numbness and headaches.  Psychiatric/Behavioral: Negative for agitation and confusion.    Physical Exam  Patient Vitals for the past 24 hrs:  BP Temp src Pulse Resp  07/06/16 1238 117/67 Axillary 76 16   Constitutional: Well-developed, well-nourished female in no acute distress.  Head: Mild TTP at occiput. No swelling, abrasions, bruising. Normal ROM of neck.  Cardiovascular: normal rate Respiratory: normal effort GI: Abd soft, non-tender, gravid appropriate for gestational age.  MS: Extremities nontender, no edema, normal ROM Neurologic: Alert and oriented x 4. Initially having difficulties remembering medication names and doses and some details of medical Hx. Does not remember having seizure. Memory for everything except for the seizure returned fully in< 1 hour.  GU: Deferred  FHT:  Baseline 120 , moderate variability, accelerations present, no decelerations Contractions: None   Labs: Results for orders placed or performed during the hospital encounter of 07/06/16 (from the past 24 hour(s))  CBC     Status: Abnormal   Collection Time: 07/06/16  1:22 PM  Result Value Ref Range   WBC 11.8 (H) 4.0 - 10.5 K/uL   RBC 3.43 (L) 3.87 - 5.11 MIL/uL   Hemoglobin 10.5 (L) 12.0 - 15.0 g/dL   HCT 16.1 (L) 09.6 - 04.5 %   MCV 88.3 78.0 - 100.0 fL   MCH 30.6 26.0 - 34.0 pg   MCHC 34.7 30.0 - 36.0 g/dL   RDW 40.9 81.1 - 91.4 %   Platelets 245 150 - 400 K/uL  Comprehensive metabolic panel     Status: Abnormal   Collection Time: 07/06/16  1:22 PM  Result Value Ref Range   Sodium 136 135 - 145 mmol/L   Potassium 4.2 3.5 - 5.1 mmol/L   Chloride 107 101 - 111 mmol/L   CO2 24 22 - 32 mmol/L   Glucose, Bld 75 65 - 99 mg/dL    BUN 7 6 - 20 mg/dL   Creatinine, Ser 7.82 0.44 - 1.00 mg/dL   Calcium 8.1 (L) 8.9 - 10.3 mg/dL   Total Protein 5.7 (L) 6.5 - 8.1 g/dL   Albumin 2.7 (L) 3.5 - 5.0 g/dL   AST 21 15 - 41 U/L   ALT 17 14 - 54 U/L   Alkaline Phosphatase 78 38 - 126 U/L   Total Bilirubin 0.4 0.3 - 1.2 mg/dL   GFR calc non Af Amer >60 >60 mL/min   GFR calc Af Amer >60 >60 mL/min   Anion gap 5 5 - 15  Protein / creatinine ratio, urine  Status: None   Collection Time: 07/06/16  3:15 PM  Result Value Ref Range   Creatinine, Urine 106.00 mg/dL   Total Protein, Urine 7 mg/dL   Protein Creatinine Ratio 0.07 0.00 - 0.15 mg/mg[Cre]  Urinalysis, Routine w reflex microscopic (not at [facility])     Status: Abnormal   Collection Time: 07/06/16  3:15 PM  Result Value Ref Range   Color, Urine YELLOW YELLOW   APPearance CLEAR CLEAR   Specific Gravity, Urine 1.020 1.005 - 1.030   pH 6.0 5.0 - 8.0   Glucose, UA NEGATIVE NEGATIVE mg/dL   Hgb urine dipstick NEGATIVE NEGATIVE   Bilirubin Urine NEGATIVE NEGATIVE   Ketones, ur NEGATIVE NEGATIVE mg/dL   Protein, ur NEGATIVE NEGATIVE mg/dL   Nitrite NEGATIVE NEGATIVE   Leukocytes, UA SMALL (A) NEGATIVE  Rapid urine drug screen (hospital performed)     Status: Abnormal   Collection Time: 07/06/16  3:15 PM  Result Value Ref Range   Opiates NONE DETECTED NONE DETECTED   Cocaine NONE DETECTED NONE DETECTED   Benzodiazepines POSITIVE (A) NONE DETECTED   Amphetamines NONE DETECTED NONE DETECTED   Tetrahydrocannabinol POSITIVE (A) NONE DETECTED   Barbiturates NONE DETECTED NONE DETECTED  Urine microscopic-add on     Status: Abnormal   Collection Time: 07/06/16  3:15 PM  Result Value Ref Range   Squamous Epithelial / LPF 0-5 (A) NONE SEEN   WBC, UA 0-5 0 - 5 WBC/hpf   RBC / HPF NONE SEEN 0 - 5 RBC/hpf   Bacteria, UA NONE SEEN NONE SEEN    Imaging:  No results found.  MAU Course: Orders Placed This Encounter  Procedures  . CBC  . Comprehensive metabolic panel  .  Protein / creatinine ratio, urine  . Urinalysis, Routine w reflex microscopic (not [facility])  . Rapid urine drug screen (hospital performed)  . Urine microscopic-add on  . Apply ice to affected area  . Discharge patient  . Seizure precautions   Meds ordered this encounter  Medications  . LORazepam (ATIVAN) injection 2 mg  . buprenorphine (SUBUTEX) 8 MG SUBL SL tablet    Sig: Place 12 mg under the tongue daily.  Marland Kitchen. ibuprofen (ADVIL,MOTRIN) tablet 600 mg  . levETIRAcetam (KEPPRA) tablet 500 mg  . levETIRAcetam (KEPPRA) 500 MG tablet    Sig: Take 1 tablet (500 mg total) by mouth 2 (two) times daily. Take 1 tablet twice a day for one week then increase to 2 tablets twice a day    Dispense:  60 tablet    Refill:  2    Order Specific Question:   Supervising Provider    Answer:   Antonietta BarcelonaHARRAWAY-Kevonta Phariss, CAROLYN [[id number]]   Consulted w/ Dr. Roxy Mannsster, Neurologist. Recommends Keppra 500 BID x 1 week, then increase to 1000 BID. F/U w/ OP Neuro ASAP. No head CT, other orders, observation or eval needed prior to D/C.   MDM: - Probable seizure of unknown etiology. Does not appear to be R/T Benzo withdrawal this time, but CNM is unsure if pt is giving accurate Hx of meds. No evidence of preeclampsia of other emergent neurological condition.  Assessment: 1. 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 Seizure (HCC)     Plan: Discharge home in stable condition.  preterm Labor precautions and fetal kick counts Seizure precautions. Do not drive until cleared by your Neurologist.    Rx Keppra.  Discussed medication-related causes  Follow-up Information    DOONQUAH, KOFI, MD Follow  up.   Specialty:  Neurology Why:  Call to schedule appointment as soon as possible Contact information: [address] [place] [facility] [address] [phone]        [facility] Follow up on 07/12/2016.   Why:  as scheduled Contact information: [address] [place]  [address] [phone]       [facility] [place] [facility]ERN[facility] ADMISSIONS Follow up.   Why:  as needed in emergencies Contact information: [address] [id number] [place] [address] [phone]            Medication List    TAKE these medications   buprenorphine 8 MG Subl SL tablet Commonly known as:  SUBUTEX Place 12 mg under the tongue daily.   levETIRAcetam 500 MG tablet Commonly known as:  KEPPRA Take 1 tablet (500 mg total) by mouth 2 (two) times daily. Take 1 tablet twice a day for one week then increase to 2 tablets twice a day   ondansetron 4 MG disintegrating tablet Commonly known as:  ZOFRAN ODT Take 1 tablet (4 mg total) by mouth every 6 (six) hours as needed for nausea.   prenatal multivitamin Tabs tablet Take 1 tablet by mouth daily at 12 noon.       [place], CNM 07/06/2016 4:31 PM

## 2016-07-12 ENCOUNTER — Ambulatory Visit (INDEPENDENT_AMBULATORY_CARE_PROVIDER_SITE_OTHER): Payer: Medicaid Other | Admitting: Obstetrics & Gynecology

## 2016-07-12 ENCOUNTER — Encounter: Payer: Self-pay | Admitting: Obstetrics & Gynecology

## 2016-07-12 VITALS — BP 140/80 | Wt 169.3 lb

## 2016-07-12 DIAGNOSIS — F1120 Opioid dependence, uncomplicated: Secondary | ICD-10-CM

## 2016-07-12 DIAGNOSIS — Z3403 Encounter for supervision of normal first pregnancy, third trimester: Secondary | ICD-10-CM

## 2016-07-12 DIAGNOSIS — O99323 Drug use complicating pregnancy, third trimester: Secondary | ICD-10-CM

## 2016-07-12 DIAGNOSIS — Z331 Pregnant state, incidental: Secondary | ICD-10-CM

## 2016-07-12 DIAGNOSIS — Z1389 Encounter for screening for other disorder: Secondary | ICD-10-CM

## 2016-07-12 DIAGNOSIS — Z3A30 30 weeks gestation of pregnancy: Secondary | ICD-10-CM

## 2016-07-12 DIAGNOSIS — Z0283 Encounter for blood-alcohol and blood-drug test: Secondary | ICD-10-CM

## 2016-07-12 LAB — POCT URINALYSIS DIPSTICK
GLUCOSE UA: NEGATIVE
Nitrite, UA: NEGATIVE
PROTEIN UA: NEGATIVE
RBC UA: NEGATIVE

## 2016-07-12 MED ORDER — OMEPRAZOLE 20 MG PO CPDR: 20.0000 mg | DELAYED_RELEASE_CAPSULE | Freq: Every day | ORAL | 6 refills | Status: DC

## 2016-07-12 NOTE — Progress Notes (Signed)
G2P1001 5920w1d Estimated Date of Delivery: 09/19/16  Blood pressure 140/80, weight 169 lb 4.8 oz (76.8 kg), last menstrual period 03/27/2016, unknown if currently breastfeeding.   BP weight and urine results all reviewed and noted.  Please refer to the obstetrical flow sheet for the fundal height and fetal heart rate documentation:  Patient reports good fetal movement, denies any bleeding and no rupture of membranes symptoms or regular contractions. Patient is without complaints. All questions were answered.  Orders Placed This Encounter  Procedures  . Pain Management Screening Profile (10S)  . POCT urinalysis dipstick    Plan:  Continued routine obstetrical care, continues on the subutex 12mg  daily managed by Dr Gerilyn Pilgrimoonquah  Meds ordered this encounter  Medications  . omeprazole (PRILOSEC) 20 MG capsule    Sig: Take 1 capsule (20 mg total) by mouth daily. 1 tablet a day    Dispense:  30 capsule    Refill:  6   Orders Placed This Encounter  Procedures  . US OB Follow Up    Standing Status:   Future    Standing Expiration Date:   09/11/2017    Order Specific Question:   Reason for Exam (SYMPTOM  OR DIAGNOSIS REQUIRED)    Answer:   subutex use, EFW    Order Specific Question:   Preferred imaging location?    Answer:   Internal  . Pain Management Screening Profile (10S)  . POCT urinalysis dipstick    Return in about 2 weeks (around 07/26/2016) for LROB.

## 2016-07-13 LAB — PMP SCREEN PROFILE (10S), URINE
AMPHETAMINE SCRN UR: NEGATIVE ng/mL
Barbiturate Screen, Ur: NEGATIVE ng/mL
Benzodiazepine Screen, Urine: POSITIVE ng/mL
COCAINE(METAB.) SCREEN, URINE: NEGATIVE ng/mL
Cannabinoids Ur Ql Scn: POSITIVE ng/mL
Creatinine(Crt), U: 45.2 mg/dL (ref 20.0–300.0)
METHADONE SCREEN, URINE: NEGATIVE ng/mL
OPIATE SCRN UR: POSITIVE ng/mL
Oxycodone+Oxymorphone Ur Ql Scn: NEGATIVE ng/mL
PCP SCRN UR: NEGATIVE ng/mL
PROPOXYPHENE SCREEN: NEGATIVE ng/mL
Ph of Urine: 6.4 (ref 4.5–8.9)

## 2016-07-14 DIAGNOSIS — R569 Unspecified convulsions: Secondary | ICD-10-CM

## 2016-07-26 ENCOUNTER — Encounter: Payer: Medicaid Other | Admitting: Obstetrics and Gynecology

## 2016-07-26 ENCOUNTER — Other Ambulatory Visit: Payer: Medicaid Other

## 2016-08-01 ENCOUNTER — Ambulatory Visit (INDEPENDENT_AMBULATORY_CARE_PROVIDER_SITE_OTHER): Payer: Medicaid Other

## 2016-08-01 DIAGNOSIS — O99323 Drug use complicating pregnancy, third trimester: Secondary | ICD-10-CM | POA: Diagnosis not present

## 2016-08-01 DIAGNOSIS — F1120 Opioid dependence, uncomplicated: Secondary | ICD-10-CM

## 2016-08-01 DIAGNOSIS — Z3A33 33 weeks gestation of pregnancy: Secondary | ICD-10-CM

## 2016-08-01 NOTE — Progress Notes (Signed)
US 33 wks,cephalic,post pl gr 1,normal ov's bilat,afi 12.7 cm,fhr 128 bpm,efw 2109 g 46%

## 2016-08-02 ENCOUNTER — Ambulatory Visit (INDEPENDENT_AMBULATORY_CARE_PROVIDER_SITE_OTHER): Payer: Medicaid Other | Admitting: Women's Health

## 2016-08-02 ENCOUNTER — Encounter: Payer: Self-pay | Admitting: Women's Health

## 2016-08-02 VITALS — BP 124/60 | HR 76 | Wt 182.0 lb

## 2016-08-02 DIAGNOSIS — Z1389 Encounter for screening for other disorder: Secondary | ICD-10-CM

## 2016-08-02 DIAGNOSIS — R569 Unspecified convulsions: Secondary | ICD-10-CM

## 2016-08-02 DIAGNOSIS — O360131 Maternal care for anti-D [Rh] antibodies, third trimester, fetus 1: Secondary | ICD-10-CM | POA: Diagnosis not present

## 2016-08-02 DIAGNOSIS — O26899 Other specified pregnancy related conditions, unspecified trimester: Secondary | ICD-10-CM

## 2016-08-02 DIAGNOSIS — Z331 Pregnant state, incidental: Secondary | ICD-10-CM

## 2016-08-02 DIAGNOSIS — Z6791 Unspecified blood type, Rh negative: Secondary | ICD-10-CM

## 2016-08-02 DIAGNOSIS — Z3A33 33 weeks gestation of pregnancy: Secondary | ICD-10-CM

## 2016-08-02 DIAGNOSIS — Z3483 Encounter for supervision of other normal pregnancy, third trimester: Secondary | ICD-10-CM

## 2016-08-02 LAB — POCT URINALYSIS DIPSTICK
Glucose, UA: NEGATIVE
KETONES UA: NEGATIVE
Leukocytes, UA: NEGATIVE
Nitrite, UA: NEGATIVE
RBC UA: NEGATIVE

## 2016-08-02 MED ORDER — RHO D IMMUNE GLOBULIN 1500 UNIT/2ML IJ SOSY
300.0000 ug | PREFILLED_SYRINGE | Freq: Once | INTRAMUSCULAR | Status: AC
  Administered 2016-08-02: 300 ug via INTRAMUSCULAR

## 2016-08-02 MED ORDER — CITALOPRAM HYDROBROMIDE 20 MG PO TABS: 20.0000 mg | ORAL_TABLET | Freq: Every day | ORAL | 2 refills | Status: DC
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
## 2016-08-02 NOTE — Patient Instructions (Signed)
Call [facility] to get appointment scheduled  You will have your sugar test next visit.  Please do not eat or drink anything after midnight the night before you come, not even water.  You will be here for at least two hours.     Call the office [phone]) or go to [facility] if:  You begin to have strong, frequent contractions  Your water breaks.  Sometimes it is a big gush of fluid, sometimes it is just a trickle that keeps getting your panties wet or running down your legs  You have vaginal bleeding.  It is normal to have a small amount of spotting if your cervix was checked.   You don't feel your baby moving like normal.  If you don't, get you something to eat and drink and lay down and focus on feeling your baby move.  You should feel at least 10 movements in 2 hours.  If you don't, you should call the office or go to [facility].    Tdap Vaccine  It is recommended that you get the Tdap vaccine during the third trimester of EACH pregnancy to help protect your baby from getting pertussis (whooping cough)  27-36 weeks is the BEST time to do this so that you can pass the protection on to your baby. During pregnancy is better than after pregnancy, but if you are unable to get it during pregnancy it will be offered at the hospital.   You can get this vaccine at the health department or your family doctor  Everyone who will be around your baby should also be up-to-date on their vaccines. Adults (who are not pregnant) only need 1 dose of Tdap during adulthood.    Preterm Labor and Birth Information The normal length of a pregnancy is 39-41 weeks. Preterm labor is when labor starts before 37 completed weeks of pregnancy. What are the risk factors for preterm labor? Preterm labor is more likely to occur in women who:  Have certain infections during pregnancy such as a bladder infection, sexually transmitted infection, or infection inside the uterus (chorioamnionitis).  Have a  shorter-than-normal cervix.  Have gone into preterm labor before.  Have had surgery on their cervix.  Are younger than age 33 or older than age 20.  Are African American.  Are pregnant with twins or multiple babies (multiple gestation).  Take street drugs or smoke while pregnant.  Do not gain enough weight while pregnant.  Became pregnant shortly after having been pregnant. What are the symptoms of preterm labor? Symptoms of preterm labor include:  Cramps similar to those that can happen during a menstrual period. The cramps may happen with diarrhea.  Pain in the abdomen or lower back.  Regular uterine contractions that may feel like tightening of the abdomen.  A feeling of increased pressure in the pelvis.  Increased watery or bloody mucus discharge from the vagina.  Water breaking (ruptured amniotic sac). Why is it important to recognize signs of preterm labor? It is important to recognize signs of preterm labor because babies who are born prematurely may not be fully developed. This can put them at an increased risk for:  Long-term (chronic) heart and lung problems.  Difficulty immediately after birth with regulating body systems, including blood sugar, body temperature, heart rate, and breathing rate.  Bleeding in the brain.  Cerebral palsy.  Learning difficulties.  Death. These risks are highest for babies who are born before 34 weeks of pregnancy. How is preterm labor treated?  Treatment depends on the length of your pregnancy, your condition, and the health of your baby. It may involve:  Having a stitch (suture) placed in your cervix to prevent your cervix from opening too early (cerclage).  Taking or being given medicines, such as:  Hormone medicines. These may be given early in pregnancy to help support the pregnancy.  Medicine to stop contractions.  Medicines to help mature the baby's lungs. These may be prescribed if the risk of delivery is  high.  Medicines to prevent your baby from developing cerebral palsy. If the labor happens before 34 weeks of pregnancy, you may need to stay in the hospital. What should I do if I think I am in preterm labor? If you think that you are going into preterm labor, call your health care provider right away. How can I prevent preterm labor in future pregnancies? To increase your chance of having a full-term pregnancy:  Do not use any tobacco products, such as cigarettes, chewing tobacco, and e-cigarettes. If you need help quitting, ask your health care provider.  Do not use street drugs or medicines that have not been prescribed to you during your pregnancy.  Talk with your health care provider before taking any herbal supplements, even if you have been taking them regularly.  Make sure you gain a healthy amount of weight during your pregnancy.  Watch for infection. If you think that you might have an infection, get it checked right away.  Make sure to tell your health care provider if you have gone into preterm labor before. This information is not intended to replace advice given to you by your health care provider. Make sure you discuss any questions you have with your health care provider. Document Released: 10/28/2003 Document Revised: 01/18/2016 Document Reviewed: 12/29/2015 Elsevier Interactive Patient Education  2017 Elsevier Inc.   Major Depressive Disorder, Adult Major depressive disorder (MDD) is a mental health condition. It may also be called clinical depression or unipolar depression. MDD usually causes feelings of sadness, hopelessness, or helplessness. MDD can also cause physical symptoms. It can interfere with work, school, relationships, and other everyday activities. MDD may be mild, moderate, or severe. It may occur once (single episode major depressive disorder) or it may occur multiple times (recurrent major depressive disorder). What are the causes? The exact cause of  this condition is not known. MDD is most likely caused by a combination of things, which may include:  Genetic factors. These are traits that are passed along from parent to child.  Individual factors. Your personality, your behavior, and the way you handle your thoughts and feelings may contribute to MDD. This includes personality traits and behaviors learned from others.  Physical factors, such as:  Differences in the part of your brain that controls emotion. This part of your brain may be different than it is in people who do not have MDD.  Long-term (chronic) medical or psychiatric illnesses.  Social factors. Traumatic experiences or major life changes may play a role in the development of MDD. What increases the risk? This condition is more likely to develop in women. The following factors may also make you more likely to develop MDD:  A family history of depression.  Troubled family relationships.  Abnormally low levels of certain brain chemicals.  Traumatic events in childhood, especially abuse or the loss of a parent.  Being under a lot of stress, or long-term stress, especially from upsetting life experiences or losses.  A history of:  Chronic  physical illness.  Other mental health disorders.  Substance abuse.  Poor living conditions.  Experiencing social exclusion or discrimination on a regular basis. What are the signs or symptoms? The main symptoms of MDD typically include:  Constant depressed or irritable mood.  Loss of interest in things and activities. MDD symptoms may also include:  Sleeping or eating too much or too little.  Unexplained weight change.  Fatigue or low energy.  Feelings of worthlessness or guilt.  Difficulty thinking clearly or making decisions.  Thoughts of suicide or of harming others.  Physical agitation or weakness.  Isolation. Severe cases of MDD may also occur with other symptoms, such as:  Delusions or  hallucinations, in which you imagine things that are not real (psychotic depression).  Low-level depression that lasts at least a year (chronic depression or persistent depressive disorder).  Extreme sadness and hopelessness (melancholic depression).  Trouble speaking and moving (catatonic depression). How is this diagnosed? This condition may be diagnosed based on:  Your symptoms.  Your medical history, including your mental health history. This may involve tests to evaluate your mental health. You may be asked questions about your lifestyle, including any drug and alcohol use, and how long you have had symptoms of MDD.  A physical exam.  Blood tests to rule out other conditions. You must have a depressed mood and at least four other MDD symptoms most of the day, nearly every day in the same 2-week timeframe before your health care provider can confirm a diagnosis of MDD. How is this treated? This condition is usually treated by mental health professionals, such as psychologists, psychiatrists, and clinical social workers. You may need more than one type of treatment. Treatment may include:  Psychotherapy. This is also called talk therapy or counseling. Types of psychotherapy include:  Cognitive behavioral therapy (CBT). This type of therapy teaches you to recognize unhealthy feelings, thoughts, and behaviors, and replace them with positive thoughts and actions.  Interpersonal therapy (IPT). This helps you to improve the way you relate to and communicate with others.  Family therapy. This treatment includes members of your family.  Medicine to treat anxiety and depression, or to help you control certain emotions and behaviors.  Lifestyle changes, such as:  Limiting alcohol and drug use.  Exercising regularly.  Getting plenty of sleep.  Making healthy eating choices.  Spending more time outdoors. Treatments involving stimulation of the brain can be used in situations with  extremely severe symptoms, or when medicine or other therapies do not work over time. These treatments include electroconvulsive therapy, transcranial magnetic stimulation, and vagal nerve stimulation. Follow these instructions at home: Activity  Return to your normal activities as told by your health care provider.  Exercise regularly and spend time outdoors as told by your health care provider. General instructions  Take over-the-counter and prescription medicines only as told by your health care provider.  Do not drink alcohol. If you drink alcohol, limit your alcohol intake to no more than 1 drink a day for nonpregnant women and 2 drinks a day for men. One drink equals 12 oz of beer, 5 oz of wine, or 1 oz of hard liquor. Alcohol can affect any antidepressant medicines you are taking. Talk to your health care provider about your alcohol use.  Eat a healthy diet and get plenty of sleep.  Find activities that you enjoy doing, and make time to do them.  Consider joining a support group. Your health care provider may be able to  recommend a support group.  Keep all follow-up visits as told by your health care provider. This is important. Where to find more information: [employer]ness  www.nami.org [employer]  http://www.maynard.net/www.nimh.nih.gov National Suicide Prevention Lifeline  1-800-273-TALK 7736564204(8255). This is free, 24-hour help. Contact a health care provider if:  Your symptoms get worse.  You develop new symptoms. Get help right away if:  You self-harm.  You have serious thoughts about hurting yourself or others.  You see, hear, taste, smell, or feel things that are not present (hallucinate). This information is not intended to replace advice given to you by your health care provider. Make sure you discuss any questions you have with your health care provider. Document Released: 12/02/2012 Document Revised: 04/13/2016 Document Reviewed:  02/16/2016 Elsevier Interactive Patient Education  2017 [employer]

## 2016-08-04 ENCOUNTER — Other Ambulatory Visit: Payer: Medicaid Other

## 2016-08-04 DIAGNOSIS — Z131 Encounter for screening for diabetes mellitus: Secondary | ICD-10-CM

## 2016-08-04 DIAGNOSIS — Z3483 Encounter for supervision of other normal pregnancy, third trimester: Secondary | ICD-10-CM

## 2016-08-07 ENCOUNTER — Other Ambulatory Visit: Payer: Self-pay | Admitting: Women's Health

## 2016-08-07 MED ORDER — FERROUS SULFATE 325 (65 FE) MG PO TABS: 325.0000 mg | ORAL_TABLET | Freq: Two times a day (BID) | ORAL | 3 refills | Status: DC

## 2016-08-09 LAB — CBC
HEMATOCRIT: 32.6 % — AB (ref 34.0–46.6)
HEMOGLOBIN: 10.9 g/dL — AB (ref 11.1–15.9)
MCH: 29.2 pg (ref 26.6–33.0)
MCHC: 33.4 g/dL (ref 31.5–35.7)
MCV: 87 fL (ref 79–97)
Platelets: 294 10*3/uL (ref 150–379)
RBC: 3.73 x10E6/uL — ABNORMAL LOW (ref 3.77–5.28)
RDW: 13.2 % (ref 12.3–15.4)
WBC: 11.3 10*3/uL — ABNORMAL HIGH (ref 3.4–10.8)

## 2016-08-09 LAB — HIV ANTIBODY (ROUTINE TESTING W REFLEX): HIV Screen 4th Generation wRfx: NONREACTIVE

## 2016-08-09 LAB — GLUCOSE TOLERANCE, 2 HOURS W/ 1HR
GLUCOSE, 1 HOUR: 124 mg/dL (ref 65–179)
Glucose, 2 hour: 126 mg/dL (ref 65–152)
Glucose, Fasting: 73 mg/dL (ref 65–91)

## 2016-08-09 LAB — AB SCR+ANTIBODY ID: ANTIBODY SCREEN: POSITIVE — AB

## 2016-08-09 LAB — RPR: RPR: NONREACTIVE

## 2016-08-09 LAB — ANTIBODY SCREEN

## 2016-08-16 ENCOUNTER — Encounter: Payer: Medicaid Other | Admitting: Advanced Practice Midwife

## 2016-08-17 ENCOUNTER — Ambulatory Visit (INDEPENDENT_AMBULATORY_CARE_PROVIDER_SITE_OTHER): Payer: Medicaid Other | Admitting: Advanced Practice Midwife

## 2016-08-17 ENCOUNTER — Encounter: Payer: Self-pay | Admitting: Advanced Practice Midwife

## 2016-08-17 VITALS — BP 116/73 | HR 83 | Wt 181.8 lb

## 2016-08-17 DIAGNOSIS — Z1389 Encounter for screening for other disorder: Secondary | ICD-10-CM

## 2016-08-17 DIAGNOSIS — Z3A35 35 weeks gestation of pregnancy: Secondary | ICD-10-CM

## 2016-08-17 DIAGNOSIS — O26843 Uterine size-date discrepancy, third trimester: Secondary | ICD-10-CM

## 2016-08-17 DIAGNOSIS — Z3483 Encounter for supervision of other normal pregnancy, third trimester: Secondary | ICD-10-CM

## 2016-08-17 DIAGNOSIS — Z331 Pregnant state, incidental: Secondary | ICD-10-CM

## 2016-08-17 MED ORDER — AMOXICILLIN 500 MG PO CAPS: 500.0000 mg | ORAL_CAPSULE | Freq: Three times a day (TID) | ORAL | 0 refills | Status: DC

## 2016-08-17 NOTE — Progress Notes (Signed)
G2P1001 4137w2d Estimated Date of Delivery: 09/19/16  Blood pressure 116/73, pulse 83, weight 181 lb 12.8 oz (82.5 kg), last menstrual period 03/27/2016, unknown if currently breastfeeding.   BP weight and urine results all reviewed and noted.   Missed appt for EEG. Mom still doing poorly Please refer to the obstetrical flow sheet for the fundal height and fetal heart rate documentation:  Patient reports good fetal movement, denies any bleeding and no rupture of membranes symptoms or regular contractions. Patient is without complaints. All questions were answered.  Orders Placed This Encounter  Procedures  . POCT urinalysis dipstick    Plan:  Continued routine obstetrical care, EEG rescheduled for 1/5 at 0930  Return in about 1 week (around 08/24/2016) for LROB.

## 2016-08-17 NOTE — Addendum Note (Signed)
Addended by: Moss McRESENZO, Lindley Hiney M on: 08/17/2016 04:49 PM   Modules accepted: Orders

## 2016-08-21 NOTE — L&D Delivery Note (Signed)
Delivery Note At 5:49 AM a viable female was delivered via Vaginal, Spontaneous Delivery (Presentation: vertex ROA).  APGAR: 8, 9; weight 7 lb 6.2 oz (3351 g).   Placenta status: delivered, spontaneously, intact.  Cord: 3VC  with the following complications: none.  Anesthesia:  epidural Episiotomy: None Lacerations: 2nd degree;Perineal Suture Repair: 3.0 vicryl Est. Blood Loss (mL): 200  Mom to postpartum.  Baby to Couplet care / Skin to Skin.  Levie HeritageJacob J Stinson 09/27/2016, 7:50 AM

## 2016-08-24 ENCOUNTER — Encounter: Payer: Self-pay | Admitting: Advanced Practice Midwife

## 2016-08-24 ENCOUNTER — Ambulatory Visit (INDEPENDENT_AMBULATORY_CARE_PROVIDER_SITE_OTHER): Payer: Medicaid Other | Admitting: Advanced Practice Midwife

## 2016-08-24 ENCOUNTER — Ambulatory Visit (INDEPENDENT_AMBULATORY_CARE_PROVIDER_SITE_OTHER): Payer: Medicaid Other

## 2016-08-24 VITALS — BP 100/58 | HR 76 | Wt 180.4 lb

## 2016-08-24 DIAGNOSIS — Z3A36 36 weeks gestation of pregnancy: Secondary | ICD-10-CM

## 2016-08-24 DIAGNOSIS — O26843 Uterine size-date discrepancy, third trimester: Secondary | ICD-10-CM | POA: Diagnosis not present

## 2016-08-24 DIAGNOSIS — Z3483 Encounter for supervision of other normal pregnancy, third trimester: Secondary | ICD-10-CM

## 2016-08-24 DIAGNOSIS — Z331 Pregnant state, incidental: Secondary | ICD-10-CM

## 2016-08-24 DIAGNOSIS — Z1389 Encounter for screening for other disorder: Secondary | ICD-10-CM

## 2016-08-24 LAB — POCT URINALYSIS DIPSTICK
Blood, UA: NEGATIVE
GLUCOSE UA: NEGATIVE
Ketones, UA: NEGATIVE
LEUKOCYTES UA: NEGATIVE
NITRITE UA: NEGATIVE

## 2016-08-24 NOTE — Progress Notes (Signed)
G2P1001 8281w2d Estimated Date of Delivery: 09/19/16  Blood pressure (!) 100/58, pulse 76, weight 180 lb 6.4 oz (81.8 kg), last menstrual period 03/27/2016, unknown if currently breastfeeding.   BP weight and urine results all reviewed and noted.  US 36+2 wks,cephalic,fhr 140 bpm,post pl gr 2,afi 10.9 cm,EFW 2608 g 27%  Please refer to the obstetrical flow sheet for the fundal height and fetal heart rate documentation:  Patient reports good fetal movement, denies any bleeding and no rupture of membranes symptoms or regular contractions. Patient is without complaints. All questions were answered.  Orders Placed This Encounter  Procedures  . POCT urinalysis dipstick    Plan:  Continued routine obstetrical care, appt tomorrow w/[place] for EEG.   Return in about 1 week (around 08/31/2016) for LROB.

## 2016-08-24 NOTE — Progress Notes (Signed)
US 36+2 wks,cephalic,fhr 140 bpm,post pl gr 2,afi 10.9 cm,EFW 2608 g 27%

## 2016-09-01 ENCOUNTER — Ambulatory Visit (INDEPENDENT_AMBULATORY_CARE_PROVIDER_SITE_OTHER): Payer: Medicaid Other | Admitting: Obstetrics and Gynecology

## 2016-09-01 ENCOUNTER — Encounter: Payer: Self-pay | Admitting: Obstetrics and Gynecology

## 2016-09-01 VITALS — BP 128/69 | HR 77 | Wt 184.4 lb

## 2016-09-01 DIAGNOSIS — Z87440 Personal history of urinary (tract) infections: Secondary | ICD-10-CM

## 2016-09-01 DIAGNOSIS — Z3685 Encounter for antenatal screening for Streptococcus B: Secondary | ICD-10-CM

## 2016-09-01 DIAGNOSIS — Z331 Pregnant state, incidental: Secondary | ICD-10-CM

## 2016-09-01 DIAGNOSIS — Z87448 Personal history of other diseases of urinary system: Secondary | ICD-10-CM

## 2016-09-01 DIAGNOSIS — Z1389 Encounter for screening for other disorder: Secondary | ICD-10-CM

## 2016-09-01 DIAGNOSIS — Z3A38 38 weeks gestation of pregnancy: Secondary | ICD-10-CM

## 2016-09-01 DIAGNOSIS — Z3483 Encounter for supervision of other normal pregnancy, third trimester: Secondary | ICD-10-CM

## 2016-09-01 LAB — POCT URINALYSIS DIPSTICK
GLUCOSE UA: NEGATIVE
Ketones, UA: NEGATIVE
LEUKOCYTES UA: NEGATIVE
NITRITE UA: POSITIVE
Protein, UA: NEGATIVE
RBC UA: NEGATIVE

## 2016-09-01 LAB — OB RESULTS CONSOLE GBS: GBS: NEGATIVE

## 2016-09-01 NOTE — Progress Notes (Addendum)
Patient ID: Denise CrazierKaitlyn Sandoval, female   DOB: 1996-02-15, 21 y.o.   MRN: [id number]  [id number]  Estimated Date of Delivery: 09/19/16 LROB 6565w3d  Chief Complaint  Patient presents with   Routine Prenatal Visit    GBS,GC/CHL  ____  Pt takes qd 12 mg Subutex. She states the dose has remained the same throughout her pregnancy and she would like to reduce the dose soon so that she can discontinue the medication after delivery. She states she has tried taking only 8 mg a few times and tolerated it well, so she feels comfortable proceeding to reduce the dose with Neurology.    She also takes qd Celexa, which she started 30 days ago and reports has greatly improved her mood.   Patient reports good fetal movement. She denies any bleeding, rupture of membranes,or regular contractions.  Blood pressure 128/69, pulse 77, weight 184 lb 6.4 oz (83.6 kg), last menstrual period 03/27/2016, unknown if currently breastfeeding.   Urine results:notable for nitrites  refer to the ob flow sheet for FH and FHR, ,                          Physical Examination: General appearance - alert, well appearing, and in no distress                                      Abdomen - FH 36 cm,                                                         -FHR 145 bpm                                                         soft, nontender, nondistended, no masses or organomegaly                                      Pelvic - VULVA: normal appearing vulva with no masses, tenderness or lesions,      VAGINA: normal appearing vagina with normal color and discharge, no lesions,      CERVIX: normal appearing cervix without discharge or lesions, posterior, 1 cm   GBS, GC/CHL collected                                            Questions were answered. Assessment: LROB G2P1001 @ 5465w3d  UDS, urine culture sent today culture POS for E Coli with senstivity to all antibiotics. Rx Keflex  Plan:   1. Continued routine obstetrical care 2. F/u as  scheduled with [place] for management of reducing dose of Subutex.   F/u in 10-11 days for routine prenatal care   By signing my name below, I, Doreatha MartinEva Mathews, attest that this documentation has been prepared under the direction and in the presence of Tilda BurrowJohn V Ferguson, MD.  Electronically Signed: Doreatha Martin, ED Scribe. 09/01/16. 1:42 PM.

## 2016-09-04 LAB — URINE CULTURE

## 2016-09-05 ENCOUNTER — Other Ambulatory Visit: Payer: Self-pay | Admitting: Advanced Practice Midwife

## 2016-09-05 LAB — CULTURE, BETA STREP (GROUP B ONLY): Strep Gp B Culture: NEGATIVE

## 2016-09-05 LAB — GC/CHLAMYDIA PROBE AMP
Chlamydia trachomatis, NAA: NEGATIVE
NEISSERIA GONORRHOEAE BY PCR: NEGATIVE

## 2016-09-08 ENCOUNTER — Telehealth: Payer: Self-pay | Admitting: *Deleted

## 2016-09-08 MED ORDER — CEPHALEXIN 500 MG PO CAPS: 500.0000 mg | ORAL_CAPSULE | Freq: Four times a day (QID) | ORAL | 0 refills | Status: DC

## 2016-09-08 NOTE — Telephone Encounter (Signed)
Pt called. Unable to leave message. Voice mail full.

## 2016-09-08 NOTE — Addendum Note (Signed)
Addended by: Tilda BurrowFERGUSON, Shaakira Borrero V. on: 09/08/2016 01:54 PM   Modules accepted: Orders

## 2016-09-11 ENCOUNTER — Telehealth: Payer: Self-pay | Admitting: *Deleted

## 2016-09-11 ENCOUNTER — Encounter: Payer: Medicaid Other | Admitting: Women's Health

## 2016-09-11 NOTE — Telephone Encounter (Signed)
Informed patient of urine culture and prescription sent to pharmacy.

## 2016-09-13 ENCOUNTER — Encounter: Payer: Self-pay | Admitting: Obstetrics & Gynecology

## 2016-09-13 ENCOUNTER — Ambulatory Visit (INDEPENDENT_AMBULATORY_CARE_PROVIDER_SITE_OTHER): Payer: Medicaid Other | Admitting: Obstetrics & Gynecology

## 2016-09-13 VITALS — BP 141/83 | HR 90 | Wt 196.2 lb

## 2016-09-13 DIAGNOSIS — R030 Elevated blood-pressure reading, without diagnosis of hypertension: Secondary | ICD-10-CM

## 2016-09-13 DIAGNOSIS — Z1389 Encounter for screening for other disorder: Secondary | ICD-10-CM

## 2016-09-13 DIAGNOSIS — Z3A39 39 weeks gestation of pregnancy: Secondary | ICD-10-CM

## 2016-09-13 DIAGNOSIS — Z3483 Encounter for supervision of other normal pregnancy, third trimester: Secondary | ICD-10-CM

## 2016-09-13 DIAGNOSIS — Z331 Pregnant state, incidental: Secondary | ICD-10-CM

## 2016-09-13 LAB — POCT URINALYSIS DIPSTICK
Blood, UA: NEGATIVE
GLUCOSE UA: NEGATIVE
Ketones, UA: NEGATIVE
LEUKOCYTES UA: NEGATIVE
NITRITE UA: NEGATIVE
Protein, UA: NEGATIVE

## 2016-09-13 NOTE — Progress Notes (Signed)
G2P1001 2822w1d Estimated Date of Delivery: 09/19/16  Blood pressure (!) 141/83, pulse 90, weight 196 lb 3.2 oz (89 kg), last menstrual period 03/27/2016, unknown if currently breastfeeding.   BP weight and urine results all reviewed and noted.  Please refer to the obstetrical flow sheet for the fundal height and fetal heart rate documentation:  Patient reports good fetal movement, denies any bleeding and no rupture of membranes symptoms or regular contractions. Patient is without complaints. All questions were answered.  Orders Placed This Encounter  Procedures  . POCT urinalysis dipstick    Plan:  Continued routine obstetrical care, BP creeping a bit No CNS symptoms, negative protein Cervix 1 cm/soft/posterior/-2/50%  Return in about 1 week (around 09/20/2016) for LROB.

## 2016-09-20 ENCOUNTER — Encounter: Payer: Self-pay | Admitting: Obstetrics and Gynecology

## 2016-09-20 ENCOUNTER — Ambulatory Visit (INDEPENDENT_AMBULATORY_CARE_PROVIDER_SITE_OTHER): Payer: Medicaid Other | Admitting: Obstetrics and Gynecology

## 2016-09-20 ENCOUNTER — Other Ambulatory Visit: Payer: Self-pay | Admitting: Obstetrics and Gynecology

## 2016-09-20 VITALS — BP 110/60 | HR 80 | Wt 196.6 lb

## 2016-09-20 DIAGNOSIS — Z1389 Encounter for screening for other disorder: Secondary | ICD-10-CM

## 2016-09-20 DIAGNOSIS — Z331 Pregnant state, incidental: Secondary | ICD-10-CM

## 2016-09-20 DIAGNOSIS — Z3A41 41 weeks gestation of pregnancy: Secondary | ICD-10-CM

## 2016-09-20 DIAGNOSIS — O480 Post-term pregnancy: Secondary | ICD-10-CM

## 2016-09-20 LAB — POCT URINALYSIS DIPSTICK
GLUCOSE UA: NEGATIVE
Ketones, UA: NEGATIVE
LEUKOCYTES UA: NEGATIVE
NITRITE UA: NEGATIVE
Protein, UA: NEGATIVE
RBC UA: NEGATIVE

## 2016-09-20 NOTE — Progress Notes (Signed)
Denise Sandoval is a 21 y.o. female  G2P1001  Estimated Date of Delivery: 09/19/16 LROB 7967w1d  Chief Complaint  Patient presents with  . Routine Prenatal Visit  ____  Patient has no acute complaints at this time. Patient reports  good fetal movement;  denies any bleeding , rupture of membranes,or regular contractions.  Last menstrual period 03/27/2016, unknown if currently breastfeeding.   Urine results:NEGATIVE refer to the ob flow sheet for FH and FHR, ,                          Physical Examination: General appearance - alert, well appearing, and in no distress and oriented to person, place, and time                                      Abdomen - FH 36 cm ,                                                         -FHR 118                                                         soft, nontender, nondistended, no masses or organomegaly                                      Pelvic - VULVA: normal appearing vulva with no masses, tenderness or lesions, VAGINA: normal appearing vagina with normal color and discharge, no lesions, CERVIX: 1 cm dilated                                           Questions were answered. Assessment: LROB G2P1001 @ 3267w1d  Plan:  Continued routine obstetrical care  F/u in 2 days for NST  IOL scheduled for 7:30 Tuesday Feb 6th morning  By signing my name below, I, Denise Sandoval, attest that this documentation has been prepared under the direction and in the presence of Tilda BurrowJohn V Chelci [place], MD . Electronically Signed: Freida Busmaniana Sandoval, Scribe. 09/20/2016. 3:33 PM. I personally performed the services described in this documentation, which was SCRIBED in my presence. The recorded information has been reviewed and considered accurate. It has been edited as necessary during review. Tilda BurrowFERGUSON,Joyceann Kruser V, MD

## 2016-09-21 ENCOUNTER — Telehealth (HOSPITAL_COMMUNITY): Payer: Self-pay | Admitting: *Deleted

## 2016-09-21 NOTE — Telephone Encounter (Signed)
Preadmission screen  

## 2016-09-22 ENCOUNTER — Ambulatory Visit (INDEPENDENT_AMBULATORY_CARE_PROVIDER_SITE_OTHER): Payer: Medicaid Other | Admitting: Obstetrics & Gynecology

## 2016-09-22 ENCOUNTER — Encounter: Payer: Self-pay | Admitting: Obstetrics & Gynecology

## 2016-09-22 VITALS — BP 120/70 | HR 82 | Wt 201.4 lb

## 2016-09-22 DIAGNOSIS — O480 Post-term pregnancy: Secondary | ICD-10-CM

## 2016-09-22 DIAGNOSIS — Z3483 Encounter for supervision of other normal pregnancy, third trimester: Secondary | ICD-10-CM

## 2016-09-22 DIAGNOSIS — Z3A41 41 weeks gestation of pregnancy: Secondary | ICD-10-CM | POA: Diagnosis not present

## 2016-09-22 DIAGNOSIS — Z1389 Encounter for screening for other disorder: Secondary | ICD-10-CM

## 2016-09-22 DIAGNOSIS — Z331 Pregnant state, incidental: Secondary | ICD-10-CM

## 2016-09-22 LAB — POCT URINALYSIS DIPSTICK
Blood, UA: NEGATIVE
Glucose, UA: NEGATIVE
KETONES UA: NEGATIVE
LEUKOCYTES UA: NEGATIVE
NITRITE UA: NEGATIVE
PROTEIN UA: NEGATIVE

## 2016-09-22 NOTE — Progress Notes (Signed)
G2P1001 2520w3d Estimated Date of Delivery: 09/19/16  Blood pressure 120/70, pulse 82, weight 201 lb 6.4 oz (91.4 kg), last menstrual period 03/27/2016, unknown if currently breastfeeding.   BP weight and urine results all reviewed and noted.  Please refer to the obstetrical flow sheet for the fundal height and fetal heart rate documentation:  Patient reports good fetal movement, denies any bleeding and no rupture of membranes symptoms or regular contractions. Patient is without complaints. All questions were answered.  Orders Placed This Encounter  Procedures  . POCT urinalysis dipstick    Plan:  Continued routine obstetrical care, reactive NST  Return in about 6 weeks (around 11/03/2016) for post partum visit.

## 2016-09-26 ENCOUNTER — Inpatient Hospital Stay (HOSPITAL_COMMUNITY): Payer: Medicaid Other | Admitting: Anesthesiology

## 2016-09-26 ENCOUNTER — Encounter (HOSPITAL_COMMUNITY): Payer: Self-pay

## 2016-09-26 ENCOUNTER — Inpatient Hospital Stay (HOSPITAL_COMMUNITY)
Admission: RE | Admit: 2016-09-26 | Discharge: 2016-09-29 | DRG: 775 | Disposition: A | Discharge status: Home / Self Care | Payer: Medicaid Other | Source: Ambulatory Visit | Attending: Family Medicine | Admitting: Family Medicine

## 2016-09-26 DIAGNOSIS — F1120 Opioid dependence, uncomplicated: Secondary | ICD-10-CM | POA: Diagnosis present

## 2016-09-26 DIAGNOSIS — O99354 Diseases of the nervous system complicating childbirth: Secondary | ICD-10-CM | POA: Diagnosis present

## 2016-09-26 DIAGNOSIS — Z87891 Personal history of nicotine dependence: Secondary | ICD-10-CM | POA: Diagnosis not present

## 2016-09-26 DIAGNOSIS — O26893 Other specified pregnancy related conditions, third trimester: Secondary | ICD-10-CM | POA: Diagnosis present

## 2016-09-26 DIAGNOSIS — Z8249 Family history of ischemic heart disease and other diseases of the circulatory system: Secondary | ICD-10-CM

## 2016-09-26 DIAGNOSIS — G40909 Epilepsy, unspecified, not intractable, without status epilepticus: Secondary | ICD-10-CM | POA: Diagnosis present

## 2016-09-26 DIAGNOSIS — O99324 Drug use complicating childbirth: Secondary | ICD-10-CM | POA: Diagnosis present

## 2016-09-26 DIAGNOSIS — Z6791 Unspecified blood type, Rh negative: Secondary | ICD-10-CM

## 2016-09-26 DIAGNOSIS — O480 Post-term pregnancy: Secondary | ICD-10-CM | POA: Diagnosis present

## 2016-09-26 DIAGNOSIS — Z3A41 41 weeks gestation of pregnancy: Secondary | ICD-10-CM

## 2016-09-26 LAB — CBC
HEMATOCRIT: 28.9 % — AB (ref 36.0–46.0)
HEMOGLOBIN: 10.1 g/dL — AB (ref 12.0–15.0)
MCH: 29.8 pg (ref 26.0–34.0)
MCHC: 34.9 g/dL (ref 30.0–36.0)
MCV: 85.3 fL (ref 78.0–100.0)
Platelets: 320 10*3/uL (ref 150–400)
RBC: 3.39 MIL/uL — AB (ref 3.87–5.11)
RDW: 14.6 % (ref 11.5–15.5)
WBC: 13.6 10*3/uL — ABNORMAL HIGH (ref 4.0–10.5)

## 2016-09-26 LAB — RAPID URINE DRUG SCREEN, HOSP PERFORMED
Amphetamines: NOT DETECTED
Barbiturates: NOT DETECTED
Benzodiazepines: POSITIVE — AB
COCAINE: NOT DETECTED
OPIATES: NOT DETECTED
Tetrahydrocannabinol: POSITIVE — AB

## 2016-09-26 MED ORDER — TERBUTALINE SULFATE 1 MG/ML IJ SOLN
0.2500 mg | Freq: Once | INTRAMUSCULAR | Status: DC | PRN
  Filled 2016-09-26: qty 1

## 2016-09-26 MED ORDER — ACETAMINOPHEN 325 MG PO TABS: 650.0000 mg | ORAL_TABLET | ORAL | Status: DC | PRN

## 2016-09-26 MED ORDER — LIDOCAINE HCL (PF) 1 % IJ SOLN
30.0000 mL | INTRAMUSCULAR | Status: AC | PRN
  Administered 2016-09-27: 30 mL via SUBCUTANEOUS
  Filled 2016-09-26: qty 30

## 2016-09-26 MED ORDER — FENTANYL 2.5 MCG/ML BUPIVACAINE 1/10 % EPIDURAL INFUSION (WH - ANES)
14.0000 mL/h | INTRAMUSCULAR | Status: DC | PRN
  Administered 2016-09-26 – 2016-09-27 (×2): 14 mL/h via EPIDURAL
  Filled 2016-09-26 (×2): qty 100

## 2016-09-26 MED ORDER — OXYTOCIN 40 UNITS IN LACTATED RINGERS INFUSION - SIMPLE MED: 2.5000 [IU]/h | INTRAVENOUS | Status: DC

## 2016-09-26 MED ORDER — OXYCODONE-ACETAMINOPHEN 5-325 MG PO TABS: 1.0000 | ORAL_TABLET | ORAL | Status: DC | PRN

## 2016-09-26 MED ORDER — EPHEDRINE 5 MG/ML INJ
10.0000 mg | INTRAVENOUS | Status: DC | PRN
  Filled 2016-09-26: qty 4

## 2016-09-26 MED ORDER — LEVETIRACETAM 500 MG PO TABS
1000.0000 mg | ORAL_TABLET | Freq: Two times a day (BID) | ORAL | Status: DC
  Administered 2016-09-26: 1000 mg via ORAL
  Filled 2016-09-26 (×2): qty 2

## 2016-09-26 MED ORDER — FENTANYL CITRATE (PF) 100 MCG/2ML IJ SOLN
100.0000 ug | INTRAMUSCULAR | Status: DC | PRN
  Administered 2016-09-26: 100 ug via INTRAVENOUS
  Filled 2016-09-26: qty 2

## 2016-09-26 MED ORDER — LACTATED RINGERS IV SOLN
INTRAVENOUS | Status: DC
  Administered 2016-09-26 (×3): via INTRAVENOUS

## 2016-09-26 MED ORDER — PHENYLEPHRINE 40 MCG/ML (10ML) SYRINGE FOR IV PUSH (FOR BLOOD PRESSURE SUPPORT)
80.0000 ug | PREFILLED_SYRINGE | INTRAVENOUS | Status: DC | PRN
  Administered 2016-09-26: 80 ug via INTRAVENOUS
  Filled 2016-09-26: qty 10
  Filled 2016-09-26: qty 5

## 2016-09-26 MED ORDER — CITALOPRAM HYDROBROMIDE 20 MG PO TABS
20.0000 mg | ORAL_TABLET | Freq: Every day | ORAL | Status: DC
  Filled 2016-09-26: qty 1

## 2016-09-26 MED ORDER — FLEET ENEMA 7-19 GM/118ML RE ENEM: 1.0000 | ENEMA | RECTAL | Status: DC | PRN

## 2016-09-26 MED ORDER — OXYTOCIN BOLUS FROM INFUSION
500.0000 mL | Freq: Once | INTRAVENOUS | Status: AC
  Administered 2016-09-27: 999 mL/h via INTRAVENOUS

## 2016-09-26 MED ORDER — OXYCODONE-ACETAMINOPHEN 5-325 MG PO TABS: 2.0000 | ORAL_TABLET | ORAL | Status: DC | PRN

## 2016-09-26 MED ORDER — PHENYLEPHRINE 40 MCG/ML (10ML) SYRINGE FOR IV PUSH (FOR BLOOD PRESSURE SUPPORT)
80.0000 ug | PREFILLED_SYRINGE | INTRAVENOUS | Status: DC | PRN
  Administered 2016-09-26: 80 ug via INTRAVENOUS
  Filled 2016-09-26 (×2): qty 10
  Filled 2016-09-26: qty 5

## 2016-09-26 MED ORDER — BUPRENORPHINE HCL 8 MG SL SUBL: 12.0000 mg | SUBLINGUAL_TABLET | Freq: Every day | SUBLINGUAL | Status: DC

## 2016-09-26 MED ORDER — MISOPROSTOL 25 MCG QUARTER TABLET
25.0000 ug | ORAL_TABLET | ORAL | Status: DC | PRN
  Administered 2016-09-26: 25 ug via VAGINAL
  Filled 2016-09-26: qty 0.25

## 2016-09-26 MED ORDER — LIDOCAINE HCL (PF) 1 % IJ SOLN
INTRAMUSCULAR | Status: DC | PRN
  Administered 2016-09-26 (×2): 6 mL via EPIDURAL

## 2016-09-26 MED ORDER — OXYTOCIN 40 UNITS IN LACTATED RINGERS INFUSION - SIMPLE MED
1.0000 m[IU]/min | INTRAVENOUS | Status: DC
  Administered 2016-09-26: 2 m[IU]/min via INTRAVENOUS
  Filled 2016-09-26: qty 1000

## 2016-09-26 MED ORDER — LACTATED RINGERS IV SOLN: 500.0000 mL | Freq: Once | INTRAVENOUS | Status: DC

## 2016-09-26 MED ORDER — DIPHENHYDRAMINE HCL 50 MG/ML IJ SOLN: 12.5000 mg | INTRAMUSCULAR | Status: DC | PRN

## 2016-09-26 MED ORDER — SOD CITRATE-CITRIC ACID 500-334 MG/5ML PO SOLN: 30.0000 mL | ORAL | Status: DC | PRN

## 2016-09-26 MED ORDER — LACTATED RINGERS IV SOLN: 500.0000 mL | INTRAVENOUS | Status: DC | PRN

## 2016-09-26 MED ORDER — ONDANSETRON HCL 4 MG/2ML IJ SOLN
4.0000 mg | Freq: Four times a day (QID) | INTRAMUSCULAR | Status: DC | PRN
  Administered 2016-09-27: 4 mg via INTRAVENOUS
  Filled 2016-09-26: qty 2

## 2016-09-26 NOTE — Progress Notes (Signed)
Labor Progress Note Denise Garate CrazierKaitlyn Sandoval is a 21 y.o. G2P1001 at 6440w0d presented for IOL for PD.   S:  Comfortable with epidural. No c/o.  O:  BP (!) 95/45   Pulse 76   Temp 98.2 F (36.8 C) (Oral)   Resp 18   Ht 5\' 8"  (1.727 m)   Wt 91.2 kg (201 lb)   LMP 03/27/2016   SpO2 97%   BMI 30.56 kg/m  EFM: baseline 135 bpm/ mod variability/ + accels/ no decels  Toco: 2-4 SVE: Dilation: 4 Effacement (%): 60 Cervical Position: Middle Station: -3 Presentation: Vertex Exam by:: Mindy Trogdon RN Pitocin: 2 mu/min  A/P: 21 y.o. G2P1001 6540w0d  1. Labor: latent 2. FWB: Cat I 3. Pain: epidural Continue Pitocin titration. Anticipate labor progression and SVD.  Donette LarryMelanie Petros Ahart, CNM 9:04 PM

## 2016-09-26 NOTE — Anesthesia Preprocedure Evaluation (Signed)
Anesthesia Evaluation  Patient identified by MRN, date of birth, ID band Patient awake    Reviewed: Allergy & Precautions, H&P , NPO status , Patient's Chart, lab work & pertinent test results  History of Anesthesia Complications Negative for: history of anesthetic complications  Airway Mallampati: II  TM Distance: >3 FB Neck ROM: Full    Dental no notable dental hx. (+) Teeth Intact   Pulmonary neg sleep apnea, neg COPD, neg recent URI, former smoker,    Pulmonary exam normal        Cardiovascular negative cardio ROS Normal cardiovascular exam     Neuro/Psych    GI/Hepatic negative GI ROS, Neg liver ROS,   Endo/Other    Renal/GU Recurrent pyelonephritis during pregnancy  negative genitourinary   Musculoskeletal   Abdominal Normal abdominal exam  (+)   Peds negative pediatric ROS (+)  Hematology  (+) anemia ,   Anesthesia Other Findings   Reproductive/Obstetrics (+) Pregnancy                             Anesthesia Physical  Anesthesia Plan  ASA: II  Anesthesia Plan: Epidural   Post-op Pain Management:    Induction:   Airway Management Planned:   Additional Equipment:   Intra-op Plan:   Post-operative Plan:   Informed Consent: I have reviewed the patients History and Physical, chart, labs and discussed the procedure including the risks, benefits and alternatives for the proposed anesthesia with the patient or authorized representative who has indicated his/her understanding and acceptance.     Plan Discussed with: Anesthesiologist  Anesthesia Plan Comments:         Anesthesia Quick Evaluation

## 2016-09-26 NOTE — Progress Notes (Signed)
cytotec placed 11:30. Cervix examined just now, 1.5/30/-3 vertex. Category 1 tracing. Foley catheter placed, will plan to continue cytotec 25 mcg vaginal q4 hours for ripening.

## 2016-09-26 NOTE — Anesthesia Pain Management Evaluation Note (Signed)
  CRNA Pain Management Visit Note  Patient: Denise CrazierKaitlyn Sandoval, 21 y.o., female  "Hello I am a member of the anesthesia team at [facility]. We have an anesthesia team available at all times to provide care throughout the hospital, including epidural management and anesthesia for C-section. I don't know your plan for the delivery whether it a natural birth, water birth, IV sedation, nitrous supplementation, doula or epidural, but we want to meet your pain goals."   1.Was your pain managed to your expectations on prior hospitalizations?   Yes   2.What is your expectation for pain management during this hospitalization?     Epidural  3.How can we help you reach that goal? epidural  Record the patient's initial score and the patient's pain goal.   Pain: 4  Pain Goal: 5 [facility] wants you to be able to say your pain was always managed very well.  Edison PaceWILKERSON,Hashem Goynes 09/26/2016

## 2016-09-26 NOTE — Anesthesia Procedure Notes (Signed)
Epidural Patient location during procedure: OB Start time: 09/26/2016 4:53 PM End time: 09/26/2016 4:56 PM  Staffing Anesthesiologist: Leilani AbleHATCHETT, Citlally Captain Performed: anesthesiologist   Preanesthetic Checklist Completed: patient identified, surgical consent, pre-op evaluation, timeout performed, IV checked, risks and benefits discussed and monitors and equipment checked  Epidural Patient position: sitting Prep: site prepped and draped and DuraPrep Patient monitoring: continuous pulse ox and blood pressure Approach: midline Location: L3-L4 Injection technique: LOR air  Needle:  Needle type: Tuohy  Needle gauge: 17 G Needle length: 9 cm and 9 Needle insertion depth: 6 cm Catheter type: closed end flexible Catheter size: 19 Gauge Catheter at skin depth: 11 cm Test dose: negative and Other  Assessment Sensory level: T9 Events: blood not aspirated, injection not painful, no injection resistance, negative IV test and no paresthesia  Additional Notes Reason for block:procedure for pain

## 2016-09-26 NOTE — H&P (Signed)
LABOR AND DELIVERY ADMISSION HISTORY AND PHYSICAL NOTE  Denise Sandoval is a 21 y.o. female G2P1001 with IUP at 26w0dby 14 wk u/s presenting for iol for post dates.   She reports positive fetal movement. She denies leakage of fluid or vaginal bleeding.  Prenatal History/Complications:  Past Medical History: Past Medical History:  Diagnosis Date  . Anxiety   . Depression   . Kidney infection   . Opiate use   . Postpartum endometritis 05/13/2014  . RLQ abdominal pain 05/13/2014  . [place]   . Supervision of normal first pregnancy in first trimester 09/23/2013    Past Surgical History: Past Surgical History:  Procedure Laterality Date  . WISDOM TOOTH EXTRACTION      Obstetrical History: OB History    Gravida Para Term Preterm AB Living   2 1 1     1    SAB TAB Ectopic Multiple Live Births           1      Social History: Social History   Social History  . Marital status: Single    Spouse name: N/A  . Number of children: N/A  . Years of education: N/A   Social History Main Topics  . Smoking status: Former Smoker    Packs/day: 0.25    Years: 3.00    Types: Cigarettes    Quit date: 03/11/2016  . Smokeless tobacco: Never Used     Comment: quit 3 wks ago  . Alcohol use No  . Drug use: Yes    Types: Benzodiazepines, Hydrocodone, Marijuana     Comment: used to use cocaine and marijuana   . Sexual activity: Yes    Birth control/ protection: Pill   Other Topics Concern  . None   Social History Narrative  . None    Family History: Family History  Problem Relation Age of Onset  . Alcohol abuse Mother   . Alcohol abuse Father   . Hypertension Maternal Grandmother   . Alcohol abuse Maternal Grandfather   . Alcohol abuse Paternal Grandfather   . Cancer - Lung Maternal Aunt     Allergies: No Known Allergies  Prescriptions Prior to Admission  Medication Sig Dispense Refill Last Dose  . buprenorphine (SUBUTEX) 8 MG SUBL SL tablet Place 12 mg under the  tongue daily.   09/26/2016 at Unknown time  . citalopram (CELEXA) 20 MG tablet Take 1 tablet (20 mg total) by mouth daily. 30 tablet 2 09/26/2016 at Unknown time  . levETIRAcetam (KEPPRA) 500 MG tablet TAKE 1 TABLET BY MOUTH TWICE A DAY FOR 1 WEEK THEN TAKE 2 TABLETS TWICE A DAY 60 tablet 2 09/26/2016 at Unknown time  . Prenatal Vit-Fe Fumarate-FA (PRENATAL MULTIVITAMIN) TABS tablet Take 1 tablet by mouth daily at 12 noon.   09/26/2016 at Unknown time     Review of Systems   All systems reviewed and negative except as stated in HPI  Blood pressure (!) 108/51, pulse 70, temperature 97.9 F (36.6 C), temperature source Oral, resp. rate 17, height 5' 8"  (1.727 m), weight 201 lb (91.2 kg), last menstrual period 03/27/2016, unknown if currently breastfeeding. General appearance: alert, cooperative and appears stated age Lungs: clear to auscultation bilaterally Heart: regular rate and rhythm Abdomen: soft, non-tender; bowel sounds normal Extremities: No calf swelling or tenderness Presentation: cephalic Fetal monitoring: 140/mod/-a/-d Uterine activity: quiet Dilation: 1 Effacement (%): 50 Station: Ballotable Exam by:: ADelorise Shiner RN and Dr. WSi Raider  Prenatal labs: ABO, Rh: O/Negative/-- (08/23  1701) Antibody: Positive, See Final Results (12/15 0921) Rubella: !Error!not immune RPR: Non Reactive (12/15 0921)  HBsAg: Negative (08/23 1701)  HIV: Non Reactive (12/15 0921)  GBS: Negative (01/12 0000)  2 hr glucola: wnl Genetic screening:  Too late Anatomy US: wnl  Prenatal Transfer Tool  Maternal Diabetes: No Genetic Screening: none Maternal Ultrasounds/Referrals: Normal Fetal Ultrasounds or other Referrals:  None Maternal Substance Abuse:  Hx opioids, benzodiazepines, etoh Significant Maternal Medications:  Suboxone, celexa, keppra Significant Maternal Lab Results: Lab values include: Group B Strep negative  Results for orders placed or performed during the hospital encounter of 09/26/16  (from the past 24 hour(s))  CBC   Collection Time: 09/26/16  9:15 AM  Result Value Ref Range   WBC 13.6 (H) 4.0 - 10.5 K/uL   RBC 3.39 (L) 3.87 - 5.11 MIL/uL   Hemoglobin 10.1 (L) 12.0 - 15.0 g/dL   HCT 28.9 (L) 36.0 - 46.0 %   MCV 85.3 78.0 - 100.0 fL   MCH 29.8 26.0 - 34.0 pg   MCHC 34.9 30.0 - 36.0 g/dL   RDW 14.6 11.5 - 15.5 %   Platelets 320 150 - 400 K/uL    Patient Active Problem List   Diagnosis Date Noted  . Post-dates pregnancy 09/26/2016  . Seizure ([place]) 07/14/2016  . History of chlamydia 04/17/2016  . History of postpartum depression, currently pregnant 04/17/2016  . Late prenatal care affecting pregnancy 04/17/2016  . Supervision of normal pregnancy 04/12/2016  . MDD (major depressive disorder), recurrent severe, without psychosis ([place]) 04/30/2015  . Alcohol use disorder, moderate, dependence ([place]) 04/30/2015  . Opioid use disorder, moderate, dependence ([place]) 04/30/2015  . Moderate benzodiazepine use disorder ([place]) 04/30/2015  . Major depressive disorder 04/25/2015  . Post traumatic stress disorder (PTSD) 01/16/2015  . Marijuana use 01/14/2014  . Rubella non-immune status, antepartum 01/14/2014  . Rh negative state in antepartum period 01/14/2014    Assessment: Denise Sandoval is a 21 y.o. G2P1001 at 98w0dhere for pdiol  #Labor:cytotec for ripening, eventual foley #Pain: Eventual epidural #FWB:  cat 1 #ID:  gbs neg #MOF:  breast #MOC: POP #Circ:  Yes, outpt #RNI: mmr PP #Rh neg: rh w/u PP #hx ptsd, mdd: continue celexa, has therapist #opioid dependence: continue suboxone 12 qd, will check urine tox #hx seizures: saw neuro this pregnancy. Had one seizure this preg, possible benzo withdrawal. Started on keppra 1000 bid this pregnancy w/ plans to continue PP. Has neuro f/u PP  Denise Sandoval B Aedon Deason 09/26/2016, 11:12 AM

## 2016-09-27 ENCOUNTER — Encounter (HOSPITAL_COMMUNITY): Payer: Self-pay

## 2016-09-27 DIAGNOSIS — F1120 Opioid dependence, uncomplicated: Secondary | ICD-10-CM

## 2016-09-27 DIAGNOSIS — Z3A41 41 weeks gestation of pregnancy: Secondary | ICD-10-CM

## 2016-09-27 LAB — RPR: RPR: NONREACTIVE

## 2016-09-27 MED ORDER — PRENATAL MULTIVITAMIN CH
1.0000 | ORAL_TABLET | Freq: Every day | ORAL | Status: DC
  Administered 2016-09-27: 1 via ORAL
  Filled 2016-09-27: qty 1

## 2016-09-27 MED ORDER — BUPRENORPHINE HCL 8 MG SL SUBL
12.0000 mg | SUBLINGUAL_TABLET | Freq: Every day | SUBLINGUAL | Status: DC
Start: 2016-09-27 — End: 2016-09-29
  Administered 2016-09-27 – 2016-09-29 (×3): 12 mg via SUBLINGUAL
  Filled 2016-09-27 (×3): qty 2

## 2016-09-27 MED ORDER — PRENATAL MULTIVITAMIN CH
1.0000 | ORAL_TABLET | Freq: Every day | ORAL | Status: DC
  Administered 2016-09-28 – 2016-09-29 (×2): 1 via ORAL
  Filled 2016-09-27 (×2): qty 1

## 2016-09-27 MED ORDER — ONDANSETRON HCL 4 MG PO TABS: 4.0000 mg | ORAL_TABLET | ORAL | Status: DC | PRN

## 2016-09-27 MED ORDER — SIMETHICONE 80 MG PO CHEW: 80.0000 mg | CHEWABLE_TABLET | ORAL | Status: DC | PRN

## 2016-09-27 MED ORDER — IBUPROFEN 600 MG PO TABS
600.0000 mg | ORAL_TABLET | Freq: Four times a day (QID) | ORAL | Status: DC
  Administered 2016-09-27 – 2016-09-29 (×10): 600 mg via ORAL
  Filled 2016-09-27 (×10): qty 1

## 2016-09-27 MED ORDER — COCONUT OIL OIL: 1.0000 "application " | TOPICAL_OIL | Status: DC | PRN

## 2016-09-27 MED ORDER — BENZOCAINE-MENTHOL 20-0.5 % EX AERO: 1.0000 "application " | INHALATION_SPRAY | CUTANEOUS | Status: DC | PRN

## 2016-09-27 MED ORDER — LEVETIRACETAM 500 MG PO TABS
1000.0000 mg | ORAL_TABLET | Freq: Two times a day (BID) | ORAL | Status: DC
  Administered 2016-09-27 – 2016-09-29 (×5): 1000 mg via ORAL
  Filled 2016-09-27 (×7): qty 2

## 2016-09-27 MED ORDER — CITALOPRAM HYDROBROMIDE 20 MG PO TABS
20.0000 mg | ORAL_TABLET | Freq: Every day | ORAL | Status: DC
  Administered 2016-09-27 – 2016-09-29 (×3): 20 mg via ORAL
  Filled 2016-09-27 (×4): qty 1

## 2016-09-27 MED ORDER — DIPHENHYDRAMINE HCL 25 MG PO CAPS: 25.0000 mg | ORAL_CAPSULE | Freq: Four times a day (QID) | ORAL | Status: DC | PRN

## 2016-09-27 MED ORDER — SENNOSIDES-DOCUSATE SODIUM 8.6-50 MG PO TABS
2.0000 | ORAL_TABLET | ORAL | Status: DC
  Administered 2016-09-27 – 2016-09-29 (×2): 2 via ORAL
  Filled 2016-09-27 (×2): qty 2

## 2016-09-27 MED ORDER — TETANUS-DIPHTH-ACELL PERTUSSIS 5-2.5-18.5 LF-MCG/0.5 IM SUSP: 0.5000 mL | Freq: Once | INTRAMUSCULAR | Status: DC

## 2016-09-27 MED ORDER — WITCH HAZEL-GLYCERIN EX PADS: 1.0000 "application " | MEDICATED_PAD | CUTANEOUS | Status: DC | PRN

## 2016-09-27 MED ORDER — ONDANSETRON HCL 4 MG/2ML IJ SOLN: 4.0000 mg | INTRAMUSCULAR | Status: DC | PRN

## 2016-09-27 MED ORDER — ACETAMINOPHEN 325 MG PO TABS: 650.0000 mg | ORAL_TABLET | ORAL | Status: DC | PRN

## 2016-09-27 MED ORDER — DIBUCAINE 1 % RE OINT: 1.0000 "application " | TOPICAL_OINTMENT | RECTAL | Status: DC | PRN

## 2016-09-27 NOTE — Progress Notes (Signed)
Labor Progress Note Denise Heidecker CrazierKaitlyn Sandoval is a 21 y.o. G2P1001 at 5963w1d presented for IOL for PD Called to room for FHR decel  S:  Comfortable with epidural.  O:  BP 113/82   Pulse (!) 133   Temp 98.8 F (37.1 C) (Oral)   Resp 16   Ht 5\' 8"  (1.727 m)   Wt 91.2 kg (201 lb)   LMP 03/27/2016   SpO2 97%   BMI 30.56 kg/m  EFM: baseline 130 bpm/ mod variability/ + accels/ prolonged decel x4 min into 60s Toco: 1-3 SVE: Dilation: 6 Effacement (%): 70 Cervical Position: Middle Station: -2 Presentation: Vertex Exam by:: Denise Sandoval CNM Pitocin: off mu/min  A/P: 21 y.o. G2P1001 5063w1d  1. Labor: early active 2. FWB: Cat II 3. Pain: epidural FHR recovered after maternal repositioning and o2. FSE placed.  Allow for fetal recovery then restart Pitocin. Anticipate SVD.  Denise Sandoval, CNM 2:36 AM

## 2016-09-27 NOTE — Anesthesia Postprocedure Evaluation (Signed)
Anesthesia Post Note  Patient: [name]  Procedure(s) Performed: * No procedures listed *  Patient location during evaluation: Mother Baby Anesthesia Type: Epidural Level of consciousness: awake, awake and alert and oriented Pain management: pain level controlled Vital Signs Assessment: post-procedure vital signs reviewed and stable Respiratory status: spontaneous breathing and nonlabored ventilation Cardiovascular status: stable Postop Assessment: no headache, no backache, epidural receding, patient able to bend at knees, no signs of nausea or vomiting and adequate PO intake Anesthetic complications: no        Last Vitals:  Vitals:   09/27/16 0855 09/27/16 1010  BP: 130/69 129/71  Pulse: 61 62  Resp: 18 16  Temp: 37.1 C 37.2 C    Last Pain:  Vitals:   09/27/16 1114  TempSrc:   PainSc: 5    Pain Goal: Patients Stated Pain Goal: 2 (09/27/16 0635)               Laban EmperorMalinova,Lorraina Spring Hristova

## 2016-09-27 NOTE — Lactation Note (Signed)
This note was copied from a baby's chart. Lactation Consultation Note  Patient Name: Denise Sandoval ZOXWR'UToday's Date: 09/27/2016 Reason for consult: Initial assessment   Spoke with mother of 10 hour old infant. Maternal history of poly substance abuse with + maternal drug screen for Benzo's and THC, infant UDS pending. Mom reports she attempted to BF her first child and does not wish to BF this child. Discussed that sometimes giving the infant EBM with Subutex may assist infant with withdrawal symptoms, mom says she was aware. Enc her to call out for BF assistance if she needs it or for a pump if she chooses to pump her milk, mom did not wish to pump at this time.    Maternal Data Formula Feeding for Exclusion: Yes Reason for exclusion: Mother's choice to formula feed on admision  Feeding    LATCH Score/Interventions                      Lactation Tools Discussed/Used     Consult Status Consult Status: Complete    Ed BlalockSharon S Lavonda Thal 09/27/2016, 3:51 PM

## 2016-09-27 NOTE — Progress Notes (Signed)
Pt has recurrent low blood pressure.Pt states she always has low BP's and is asymptomatic. Spoke with CNM who was reassured that she was asymptomatic. Will continue to Monitor.

## 2016-09-28 DIAGNOSIS — Z3A41 41 weeks gestation of pregnancy: Secondary | ICD-10-CM

## 2016-09-28 DIAGNOSIS — F1120 Opioid dependence, uncomplicated: Secondary | ICD-10-CM

## 2016-09-28 MED ORDER — MEASLES, MUMPS & RUBELLA VAC ~~LOC~~ INJ
0.5000 mL | INJECTION | Freq: Once | SUBCUTANEOUS | Status: AC
  Administered 2016-09-29: 0.5 mL via SUBCUTANEOUS
  Filled 2016-09-28 (×2): qty 0.5

## 2016-09-28 MED ORDER — RHO D IMMUNE GLOBULIN 1500 UNIT/2ML IJ SOSY
300.0000 ug | PREFILLED_SYRINGE | Freq: Once | INTRAMUSCULAR | Status: AC
  Administered 2016-09-28: 300 ug via INTRAMUSCULAR
  Filled 2016-09-28: qty 2

## 2016-09-28 MED ORDER — MEASLES, MUMPS & RUBELLA VAC ~~LOC~~ INJ
0.5000 mL | INJECTION | Freq: Once | SUBCUTANEOUS | Status: DC
  Filled 2016-09-28: qty 0.5

## 2016-09-28 NOTE — Clinical Social Work Maternal (Signed)
CLINICAL SOCIAL WORK MATERNAL/CHILD NOTE  Patient Details  Name: Denise Sandoval MRN: [id number] Date of Birth: 03/27/1996  Date:  09/28/2016  Clinical Social Worker Initiating Note:  Denise Sandoval Date/ Time Initiated:  09/28/16/1131     Child's Name:  Denise Sandoval   Legal Guardian:  Mother (FOB is Denise Sandoval 09/04/1994)   Need for Interpreter:  None   Date of Referral:  09/28/16     Reason for Referral:  Behavioral Health Issues, including SI , Current Substance Use/Substance Use During Pregnancy  (Hx of THC and Benzo use during pregnancy.)   Referral Source:  Central Nursery   Address:  [address] [place], [place] [address]  Phone number:  [phone]   Household Members:  Self, Significant Other   Natural Supports (not living in the home):  Immediate Family (FOB's family will also be a source of support. )   Professional Supports: Therapist, Other (Comment) ([name] is also seen at [facility] for Subutex managment. )   Employment: Unemployed   Type of Work:     Education:  Vocation/technical training   Financial Resources:  Medicaid   Other Resources:  WIC, Food Stamps    Cultural/Religious Considerations Which May Impact Care:  None Reported  Strengths:  Ability to meet basic needs , Home prepared for child , Understanding of illness   Risk Factors/Current Problems:  Substance Use , Mental Health Concerns    Cognitive State:  Alert , Able to Concentrate , Linear Thinking , Insightful    Mood/Affect:  Happy , Bright , Interested , Comfortable    CSW Assessment: CSW met with [name] to complete an assessment for hx of substance use and hx of anxiety/depression.  When CSW arrived, [name] and FOB (Denise Sandoval) were resting in bed.  FOB was holding infant and appeared to be comfortable bonding with infant.  [name] was polite and easy to engage.  [name] gave CSW permission to met with [name] while FOB was present.  CSW inquired about [name]'s MH hx and  [name] acknowledged a hx of anxiety/depression since age 13.  [name] disclosed that [name] symptoms were managed by Zoloft for the first 5 years and currently [name] is taking Celexa.  [name] stated that [name] feels like Celexa is also managing [name]'s symptoms. [name] communicated that [name] medications are currently being prescribed by [facility].  [name] acknowledged being an established patient at [facility] for outpatient substance abuse and mental health counseling.  [name] reports planning to make a postanal appointment after [name] and infant is d/c from hosptial.  CSW encouraged [name] to make an appointment and educated [name] about PPD. CSW informed [name] of possible supports and interventions to decrease PPD.  CSW also encouraged [name] to seek medical attention if needed for increased signs and symptoms for PPD. [name] denied PPD signs and symptoms with [name]'s oldest daughter (Denise Sandoval 05/07/14).   CSW inquired about [name]'s substance abuse hx and [name] was forthcoming.  [name] reported an addiction to marijuana and pain medications.  [name] disclosed that [name] is currently receiving Subutex medication management at [facility].  CSW inquired about [name]'s prenatal positive drug screens and asked [name] if [facility] was aware.  [name] reported [facility] is aware and has encouraged [name] not to continue to abuse illegal substance while on Subutex. [name] has a scheduled appointment at [facility] on 10/04/16.  CSW informed [name] of the hospital's drug screen policy; [name] was made aware of the 2 screenings for the infant. [name] appeared   understanding and communicated that [name] was certain that infant's UDS was going to be positive. CSW shared with [name] that the infant's UDS was positive for THC and Benzos and CSW will continue to monitor infant's CDS. CSW informed [name] that CSW will be making a report to [place] CPS.  [name] did not have any questions regarding the hospital's policy. [name] reported [name]'s last use of marijuana and Benzos was about 3  weeks ago. [name] also acknowledged a hx of CPS involvement. [name] reported that [name]'s oldest daughter Denise Sandoval 05/07/14) currently resides with [name]'s grandmother (Denise Sandoval [phone]), but [name] volunteered to a temporary custody with [name]'s grandmother without CPS involvement (this information was confirmed with [place] CPS). FOB had several questions regarding CPS involvement and disclosed he did not have a substance abuse hx.  With FOB's and [name]'s permission, CSW agreed to share FOB's information with CPS.   CSW reviewed safe sleep, and SIDS. [name] and FOB were knowledgeable and asked appropriate questions.  [name] communicated that she has a bassinet for the baby, and feels prepared for the infant.  [name] did not have any further questions, concerns, or needs at this time.    CSW made a CPS report to [place] CPS, Jacqueline Strand.  CPS will follow-up with CSW prior to [name]'s d/c.  At this time there are barriers to d/c.   CSW Plan/Description:  Information/Referral to Community Resources , Patient/Family Education , Child Protective Service Report  (CSW will monitor infant's CDS and will update [place] CPS.)    Denise Sandoval, MSW, LCSW Clinical Social Work [phone]     Denise Petterson D BOYD-GILYARD, LCSW 09/28/2016, 11:42 AM 

## 2016-09-28 NOTE — Discharge Instructions (Signed)

## 2016-09-28 NOTE — Progress Notes (Signed)
Patient given MMR VIS. 

## 2016-09-28 NOTE — Discharge Summary (Signed)
OB Discharge Summary     Patient Name: Denise Sandoval DOB: 04-Apr-1996 MRN: [id number]  Date of admission: 09/26/2016 Delivering MD: Levie HeritageSTINSON, JACOB J   Date of discharge: 09/29/2016  Admitting diagnosis: INDUCTION Intrauterine pregnancy: 1155w1d     Secondary diagnosis:  Active Problems:   Post-dates pregnancy  Additional problems: polysubstance abuse, seizure disorder     Discharge diagnosis: Term Pregnancy Delivered                                                                                                Post partum procedures:none  Augmentation: AROM, Pitocin, Cytotec and Foley Balloon  Complications: None  Hospital course:  Induction of Labor With Vaginal Delivery   21 y.o. yo [id number] at 7755w1d was admitted to the hospital 09/26/2016 for induction of labor.  Indication for induction: Postdates.  Patient had an uncomplicated labor course as follows: Membrane Rupture Time/Date: 2:33 AM ,09/27/2016   Intrapartum Procedures: Episiotomy: None [1]                                         Lacerations:  2nd degree [3];Perineal [11]  Patient had delivery of a Viable infant.  Information for the patient's newborn:  Denise Sandoval, Denise Sandoval [[id number]][[id number]]  Delivery Method: Vaginal, Spontaneous Delivery (Filed from Delivery Summary)   09/27/2016  Details of delivery can be found in separate delivery note.  Patient had a routine postpartum course. Patient is discharged home 09/29/16.  Physical exam  Vitals:   09/27/16 1850 09/27/16 2145 09/28/16 0511 09/28/16 1924  BP: (!) 127/54 105/66 (!) 104/57 (!) 126/53  Pulse: 65 64 65 65  Resp: 18 16 18 18   Temp: 98.5 F (36.9 C) 98.1 F (36.7 C) 98 F (36.7 C) 100.2 F (37.9 C)  TempSrc: Oral  Oral Oral  SpO2:  97%    Weight:      Height:       General: alert, cooperative and no distress Lochia: appropriate Uterine Fundus: firm Incision: N/A DVT Evaluation: No evidence of DVT seen on physical exam. Negative Homan's sign. No significant  calf/ankle edema. Labs: Lab Results  Component Value Date   WBC 13.6 (H) 09/26/2016   HGB 10.1 (L) 09/26/2016   HCT 28.9 (L) 09/26/2016   MCV 85.3 09/26/2016   PLT 320 09/26/2016   CMP Latest Ref Rng & Units 07/06/2016  Glucose 65 - 99 mg/dL 75  BUN 6 - 20 mg/dL 7  Creatinine 7.840.44 - 6.961.00 mg/dL 2.950.54  Sodium 284135 - 132145 mmol/L 136  Potassium 3.5 - 5.1 mmol/L 4.2  Chloride 101 - 111 mmol/L 107  CO2 22 - 32 mmol/L 24  Calcium 8.9 - 10.3 mg/dL 8.1(L)  Total Protein 6.5 - 8.1 g/dL 4.4(W5.7(L)  Total Bilirubin 0.3 - 1.2 mg/dL 0.4  Alkaline Phos 38 - 126 U/L 78  AST 15 - 41 U/L 21  ALT 14 - 54 U/L 17    Discharge instruction: per After Visit Summary and "Baby and Me Booklet".  After visit  meds:  Allergies as of 09/29/2016   No Known Allergies     Medication List    STOP taking these medications   ICY HOT EX   prenatal multivitamin Tabs tablet     TAKE these medications   buprenorphine 8 MG Subl SL tablet Commonly known as:  SUBUTEX Place 12 mg under the tongue daily.   citalopram 20 MG tablet Commonly known as:  CELEXA Take 1 tablet (20 mg total) by mouth daily.   ibuprofen 600 MG tablet Commonly known as:  ADVIL,MOTRIN Take 1 tablet (600 mg total) by mouth every 6 (six) hours.   levETIRAcetam 500 MG tablet Commonly known as:  KEPPRA TAKE 1 TABLET BY MOUTH TWICE A DAY FOR 1 WEEK THEN TAKE 2 TABLETS TWICE A DAY What changed:  See the new instructions.       Diet: routine diet  Activity: Advance as tolerated. Pelvic rest for 6 weeks.   Outpatient follow up: Follow up Appt: Future Appointments Date Time Provider Department Center  11/03/2016 10:00 AM Cheral Marker, CNM [facility]   Follow up Visit:  Postpartum contraception: Combination OCPs  Newborn Data: Live born female  Birth Weight: 7 lb 6.2 oz (3351 g) APGAR: 8, 9  Baby Feeding: Bottle Disposition: uncertain; CPS referral made by CSW:  states that CPS will need to evaluate prior to  discharge, as there is a barrier to DC (maternal substance abuse w/+ UDS of baby) Plan to room in for 4-5 days d/t subutex use.   09/29/2016 Sandoval,Denise Delorenzo, CNM

## 2016-09-28 NOTE — Progress Notes (Signed)
Post Partum Day #1 Subjective: no complaints, up ad lib and tolerating PO; bottlefeeding; OCPs for contraception  Objective: Blood pressure (!) 104/57, pulse 65, temperature 98 F (36.7 C), temperature source Oral, resp. rate 18, height _0  (1.727 m), weight 91.2 kg (201 lb), last menstrual period 03/27/2016, SpO2 97 %, unknown if currently breastfeeding.  Physical Exam:  General: alert, cooperative and no distress Lochia: appropriate Uterine Fundus: firm DVT Evaluation: No evidence of DVT seen on physical exam.   Recent Labs  09/26/16 0915  HGB 10.1*  HCT 28.9*    Assessment/Plan: Plan for discharge tomorrow and Social Work consult  MMR ordered; will need Rhogam due to infant O+ (in process) SW consult ordered due to drug exposed newborn   LOS: 2 days   Serita Grammes CNM 09/28/2016, 7:46 AM

## 2016-09-28 NOTE — Progress Notes (Signed)
UR chart review completed.  

## 2016-09-29 LAB — RH IG WORKUP (INCLUDES ABO/RH)
ABO/RH(D): O NEG
Fetal Screen: NEGATIVE
Gestational Age(Wks): 41
UNIT DIVISION: 0

## 2016-09-29 MED ORDER — IBUPROFEN 600 MG PO TABS: 600.0000 mg | ORAL_TABLET | Freq: Four times a day (QID) | ORAL | 0 refills | Status: DC

## 2016-09-30 LAB — TYPE AND SCREEN
ABO/RH(D): O NEG
Antibody Screen: POSITIVE
DAT, IGG: NEGATIVE
UNIT DIVISION: 0
UNIT DIVISION: 0

## 2016-10-10 ENCOUNTER — Emergency Department (HOSPITAL_COMMUNITY)
Admission: EM | Admit: 2016-10-10 | Discharge: 2016-10-10 | Disposition: A | Discharge status: Home / Self Care | Payer: Medicaid Other | Attending: Emergency Medicine | Admitting: Emergency Medicine

## 2016-10-10 ENCOUNTER — Encounter (HOSPITAL_COMMUNITY): Payer: Self-pay | Admitting: Emergency Medicine

## 2016-10-10 DIAGNOSIS — F1910 Other psychoactive substance abuse, uncomplicated: Secondary | ICD-10-CM | POA: Diagnosis not present

## 2016-10-10 DIAGNOSIS — G40909 Epilepsy, unspecified, not intractable, without status epilepticus: Secondary | ICD-10-CM | POA: Diagnosis not present

## 2016-10-10 DIAGNOSIS — Z87891 Personal history of nicotine dependence: Secondary | ICD-10-CM | POA: Diagnosis not present

## 2016-10-10 DIAGNOSIS — F1920 Other psychoactive substance dependence, uncomplicated: Secondary | ICD-10-CM

## 2016-10-10 DIAGNOSIS — R569 Unspecified convulsions: Secondary | ICD-10-CM | POA: Diagnosis present

## 2016-10-10 LAB — RAPID URINE DRUG SCREEN, HOSP PERFORMED
Amphetamines: NOT DETECTED
BENZODIAZEPINES: POSITIVE — AB
Barbiturates: NOT DETECTED
COCAINE: NOT DETECTED
OPIATES: NOT DETECTED
TETRAHYDROCANNABINOL: POSITIVE — AB

## 2016-10-10 LAB — CBC WITH DIFFERENTIAL/PLATELET
BASOS ABS: 0 10*3/uL (ref 0.0–0.1)
Basophils Relative: 1 %
EOS ABS: 0.3 10*3/uL (ref 0.0–0.7)
EOS PCT: 4 %
HCT: 34.9 % — ABNORMAL LOW (ref 36.0–46.0)
HEMOGLOBIN: 11.5 g/dL — AB (ref 12.0–15.0)
LYMPHS ABS: 2.9 10*3/uL (ref 0.7–4.0)
Lymphocytes Relative: 37 %
MCH: 28.8 pg (ref 26.0–34.0)
MCHC: 33 g/dL (ref 30.0–36.0)
MCV: 87.3 fL (ref 78.0–100.0)
Monocytes Absolute: 0.5 10*3/uL (ref 0.1–1.0)
Monocytes Relative: 6 %
NEUTROS PCT: 52 %
Neutro Abs: 4.1 10*3/uL (ref 1.7–7.7)
Platelets: 453 10*3/uL — ABNORMAL HIGH (ref 150–400)
RBC: 4 MIL/uL (ref 3.87–5.11)
RDW: 14.2 % (ref 11.5–15.5)
WBC: 7.8 10*3/uL (ref 4.0–10.5)

## 2016-10-10 LAB — BASIC METABOLIC PANEL
Anion gap: 7 (ref 5–15)
BUN: 14 mg/dL (ref 6–20)
CO2: 26 mmol/L (ref 22–32)
CREATININE: 0.72 mg/dL (ref 0.44–1.00)
Calcium: 8.8 mg/dL — ABNORMAL LOW (ref 8.9–10.3)
Chloride: 106 mmol/L (ref 101–111)
Glucose, Bld: 82 mg/dL (ref 65–99)
POTASSIUM: 4.2 mmol/L (ref 3.5–5.1)
Sodium: 139 mmol/L (ref 135–145)

## 2016-10-10 LAB — URINALYSIS, ROUTINE W REFLEX MICROSCOPIC
Bilirubin Urine: NEGATIVE
GLUCOSE, UA: NEGATIVE mg/dL
KETONES UR: NEGATIVE mg/dL
Nitrite: NEGATIVE
PH: 6 (ref 5.0–8.0)
Protein, ur: NEGATIVE mg/dL
SPECIFIC GRAVITY, URINE: 1.016 (ref 1.005–1.030)

## 2016-10-10 LAB — ETHANOL: Alcohol, Ethyl (B): <5 mg/dL (ref <5)

## 2016-10-10 MED ORDER — SODIUM CHLORIDE 0.9 % IV SOLN
INTRAVENOUS | Status: DC
  Administered 2016-10-10: 16:00:00 via INTRAVENOUS

## 2016-10-10 MED ORDER — LEVETIRACETAM IN NACL 1000 MG/100ML IV SOLN
1000.0000 mg | Freq: Once | INTRAVENOUS | Status: AC
  Administered 2016-10-10: 1000 mg via INTRAVENOUS
  Filled 2016-10-10: qty 100

## 2016-10-10 NOTE — ED Provider Notes (Signed)
[facility] DEPT Provider Note   CSN: [id number] Arrival date & time: 10/10/16  1519     History   Chief Complaint Chief Complaint  Patient presents with  . Seizures    HPI Denise Sandoval is a 21 y.o. female.  She presents for evaluation of reported seizure.  She was shopping at [employer] when she suddenly fell to the floor striking her head and had a seizure.  She remembers waking up on the floor.  She was transferred by EMS for evaluation.  She denies preceding symptoms.  She has a mild headache at this time.  She has not had any recent fever chills nausea vomiting cough shortness of breath or chest pain.  She is taking her Keppra 1000 mg twice daily as prescribed.  She delivered a child 13 days ago and is having ongoing vaginal bleeding, but states it is improving.  She states her child is doing well.  She denies substance abuse at this time.  There are no other known modifying factors. HPI  Past Medical History:  Diagnosis Date  . Anxiety   . Depression   . Kidney infection   . Opiate use   . Postpartum endometritis 05/13/2014  . RLQ abdominal pain 05/13/2014  . Seizures (HCC)   . Supervision of normal first pregnancy in first trimester 09/23/2013    Patient Active Problem List   Diagnosis Date Noted  . Post-dates pregnancy 09/26/2016  . Seizure (HCC) 07/14/2016  . History of chlamydia 04/17/2016  . History of postpartum depression, currently pregnant 04/17/2016  . Late prenatal care affecting pregnancy 04/17/2016  . Supervision of normal pregnancy 04/12/2016  . MDD (major depressive disorder), recurrent severe, without psychosis (HCC) 04/30/2015  . Alcohol use disorder, moderate, dependence (HCC) 04/30/2015  . Opioid use disorder, moderate, dependence (HCC) 04/30/2015  . Moderate benzodiazepine use disorder (HCC) 04/30/2015  . Major depressive disorder 04/25/2015  . Post traumatic stress disorder (PTSD) 01/16/2015  . Marijuana use 01/14/2014  . Rubella non-immune  status, antepartum 01/14/2014  . Rh negative state in antepartum period 01/14/2014    Past Surgical History:  Procedure Laterality Date  . WISDOM TOOTH EXTRACTION      OB History    Gravida Para Term Preterm AB Living   2 2 2     2    SAB TAB Ectopic Multiple Live Births         0 2       Home Medications    Prior to Admission medications   Medication Sig Start Date End Date Taking? Authorizing Provider  buprenorphine (SUBUTEX) 8 MG SUBL SL tablet Place 12 mg under the tongue daily.   Yes Historical Provider, MD  citalopram (CELEXA) 20 MG tablet Take 1 tablet (20 mg total) by mouth daily. 08/02/16  Yes Cheral Marker, CNM  levETIRAcetam (KEPPRA) 500 MG tablet TAKE 1 TABLET BY MOUTH TWICE A DAY FOR 1 WEEK THEN TAKE 2 TABLETS TWICE A DAY Patient taking differently: TAKE 2 TABLETS TWICE A DAY 09/05/16  Yes Dorathy Kinsman, CNM  ibuprofen (ADVIL,MOTRIN) 600 MG tablet Take 1 tablet (600 mg total) by mouth every 6 (six) hours. Patient not taking: Reported on 10/10/2016 09/29/16   Jacklyn Shell, CNM    Family History Family History  Problem Relation Age of Onset  . Alcohol abuse Mother   . Alcohol abuse Father   . Hypertension Maternal Grandmother   . Alcohol abuse Maternal Grandfather   . Alcohol abuse Paternal Grandfather   . Cancer -  Lung Maternal Aunt     Social History Social History  Substance Use Topics  . Smoking status: Former Smoker    Packs/day: 0.25    Years: 3.00    Types: Cigarettes    Quit date: 03/11/2016  . Smokeless tobacco: Never Used     Comment: quit 3 wks ago  . Alcohol use No     Allergies   Patient has no known allergies.   Review of Systems Review of Systems  All other systems reviewed and are negative.    Physical Exam Updated Vital Signs BP 108/57   Pulse 63   Temp 98.9 F (37.2 C) (Oral)   Resp 18   Ht 5\' 8"  (1.727 m)   Wt 201 lb (91.2 kg)   LMP 10/10/2016   SpO2 94%   Breastfeeding? Yes   BMI 30.56 kg/m    Physical Exam  Constitutional: She is oriented to person, place, and time. She appears well-developed and well-nourished.  HENT:  Head: Normocephalic and atraumatic.  No trauma to the tongue.  Mild tenderness left parietal region without crepitation, abrasion or laceration.  Eyes: Conjunctivae and EOM are normal. Pupils are equal, round, and reactive to light.  Neck: Normal range of motion and phonation normal. Neck supple.  Cardiovascular: Normal rate and regular rhythm.   Pulmonary/Chest: Effort normal and breath sounds normal. She exhibits no tenderness.  Abdominal: Soft. She exhibits no distension. There is no tenderness. There is no guarding.  Musculoskeletal: Normal range of motion.  Neurological: She is alert and oriented to person, place, and time. She exhibits normal muscle tone.  No dysarthria or aphasia or nystagmus.  Skin: Skin is warm and dry.  Psychiatric: She has a normal mood and affect. Her behavior is normal. Judgment and thought content normal.  Nursing note and vitals reviewed.    ED Treatments / Results  Labs (all labs ordered are listed, but only abnormal results are displayed) Labs Reviewed  BASIC METABOLIC PANEL - Abnormal; Notable for the following:       Result Value   Calcium 8.8 (*)    All other components within normal limits  CBC WITH DIFFERENTIAL/PLATELET - Abnormal; Notable for the following:    Hemoglobin 11.5 (*)    HCT 34.9 (*)    Platelets 453 (*)    All other components within normal limits  RAPID URINE DRUG SCREEN, HOSP PERFORMED - Abnormal; Notable for the following:    Benzodiazepines POSITIVE (*)    Tetrahydrocannabinol POSITIVE (*)    All other components within normal limits  URINALYSIS, ROUTINE W REFLEX MICROSCOPIC - Abnormal; Notable for the following:    Hgb urine dipstick MODERATE (*)    Leukocytes, UA SMALL (*)    Bacteria, UA FEW (*)    All other components within normal limits  ETHANOL  LEVETIRACETAM LEVEL    EKG  EKG  Interpretation None       Radiology No results found.  Procedures Procedures (including critical care time)  Medications Ordered in ED Medications  0.9 %  sodium chloride infusion ( Intravenous New Bag/Given 10/10/16 1625)  levETIRAcetam (KEPPRA) IVPB 1000 mg/100 mL premix (1,000 mg Intravenous Given 10/10/16 1624)     Initial Impression / Assessment and Plan / ED Course  I have reviewed the triage vital signs and the nursing notes.  Pertinent labs & imaging results that were available during my care of the patient were reviewed by me and considered in my medical decision making (see chart for details).  Clinical Course as of Oct 11 1743  Tue Oct 10, 2016  1744 Consistent with current post partum vaginal bleeding Hgb urine dipstick: (!) MODERATE [EW]  1744 Slightly low Hemoglobin: (!) 11.5 [EW]  1744 This is a nonprescribed substance Benzodiazepines: (!) POSITIVE [EW]  1744 Illicit substance Tetrahydrocannabinol: (!) POSITIVE [EW]    Clinical Course User Index [EW] Mancel Bale, MD    Medications  0.9 %  sodium chloride infusion ( Intravenous New Bag/Given 10/10/16 1625)  levETIRAcetam (KEPPRA) IVPB 1000 mg/100 mL premix (1,000 mg Intravenous Given 10/10/16 1624)    Patient Vitals for the past 24 hrs:  BP Temp Temp src Pulse Resp SpO2 Height Weight  10/10/16 1700 108/57 - - 63 - 94 % - -  10/10/16 1630 116/66 - - 64 - 97 % - -  10/10/16 1622 95/77 - - - - - - -  10/10/16 1527 113/69 98.9 F (37.2 C) Oral 83 18 96 % 5\' 8"  (1.727 m) 201 lb (91.2 kg)    5:42 PM Reevaluation with update and discussion. After initial assessment and treatment, an updated evaluation reveals patient is comfortable now no seizure activity in the ED. a female friend is with her at the time and all questions were discussed and answered. Mahlet Jergens L    Final Clinical Impressions(s) / ED Diagnoses   Final diagnoses:  Seizure (HCC)  Polysubstance (excluding opioids) dependence (HCC)     Seizure disorder, with apparent seizure.  No clear inciting causes.  Keppra level sent, prior to giving a bolus of Keppra.  Unfortunately patient is still abusing drugs, this is a chronic problem.  Doubt serious bacterial infection metabolic instability or impending vascular collapse.  Nursing Notes Reviewed/ Care Coordinated Applicable Imaging Reviewed Interpretation of Laboratory Data incorporated into ED treatment  The patient appears reasonably screened and/or stabilized for discharge and I doubt any other medical condition or other [facility] requiring further screening, evaluation, or treatment in the ED at this time prior to discharge.  Plan: Home Medications-continue usual medications; Home Treatments-rest, avoid illegal substances; return here if the recommended treatment, does not improve the symptoms; Recommended follow up-neurology follow-up 1 week.   New Prescriptions New Prescriptions   No medications on file     Mancel Bale, MD 10/10/16 [phone]

## 2016-10-10 NOTE — ED Triage Notes (Signed)
Per EMS, pt was found post ictal in the floor of the grocery store. cbg en route 143. Pt alert and oriented. Pt reports increased frequency of seizures recently. Pt denies any changes in medication.

## 2016-10-10 NOTE — Discharge Instructions (Signed)
Avoid using illegal substances.  Continue taking your Keppra as directed.  Follow up with your neurologist next week, for a checkup

## 2016-10-12 LAB — LEVETIRACETAM LEVEL: LEVETIRACETAM: NOT DETECTED ug/mL (ref 10.0–40.0)

## 2016-10-19 IMAGING — US US RENAL
1 series · 14 of 25 positions shown · non-contrast
Comparison: Abdominal ultrasound 03/25/2012

CLINICAL DATA: RIGHT flank pain, pregnant, question kidney stone

EXAM:
RENAL / URINARY TRACT ULTRASOUND COMPLETE

[Series 1: us renal · 0.25mm/px · 14 of 40 slices shown]
[im 1/40]
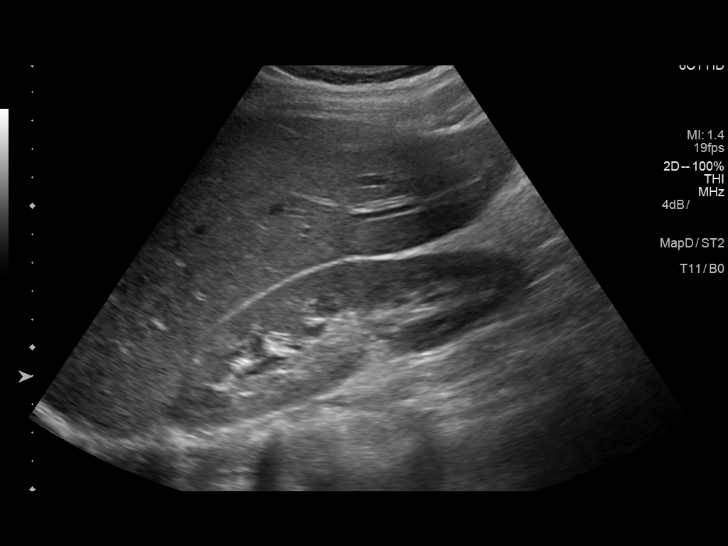
[im 4/40]
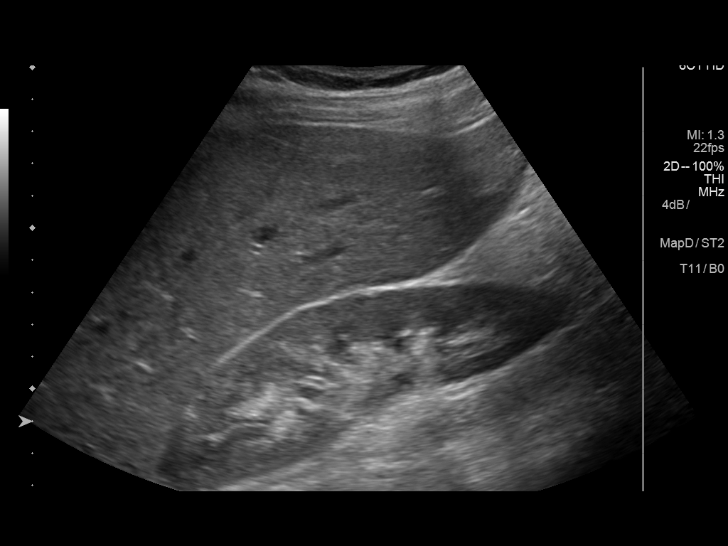
[im 7/40]
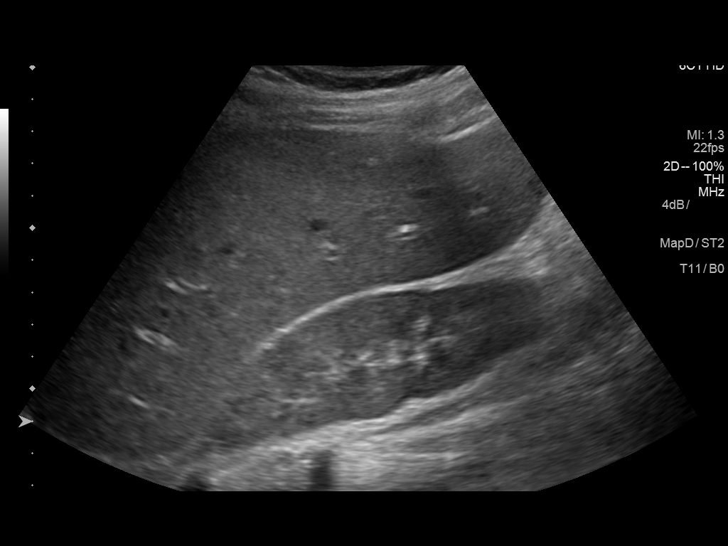
[im 10/40]
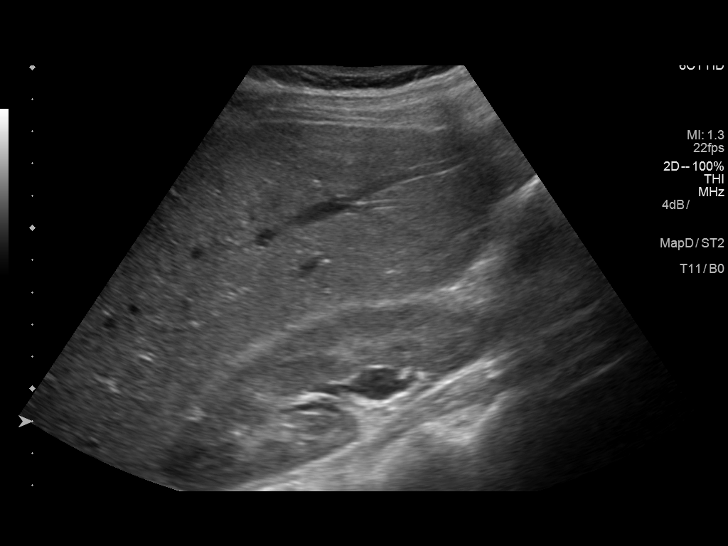
[im 14/40]
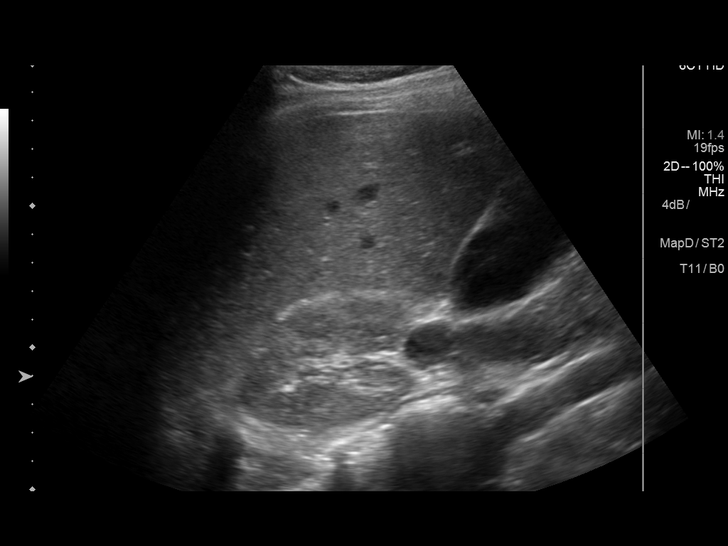
[im 15/40]
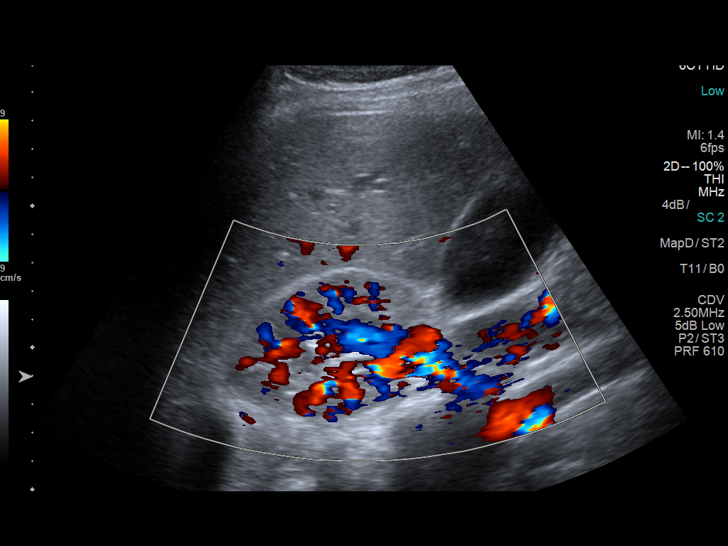
[im 18/40]
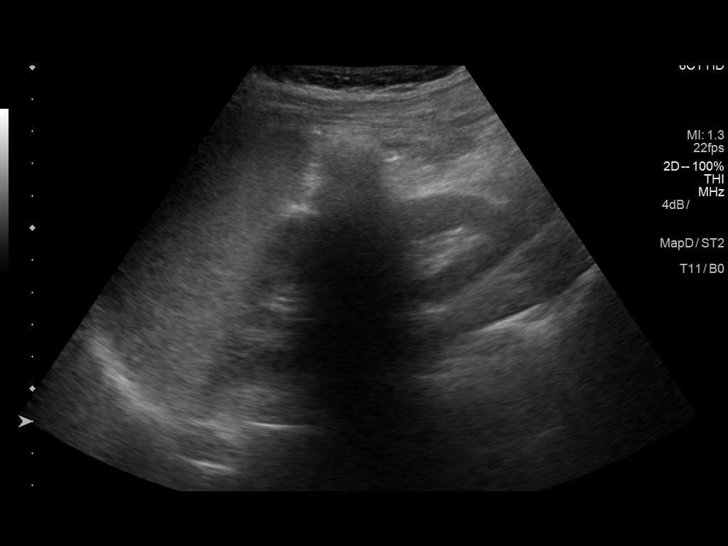
[im 22/40]
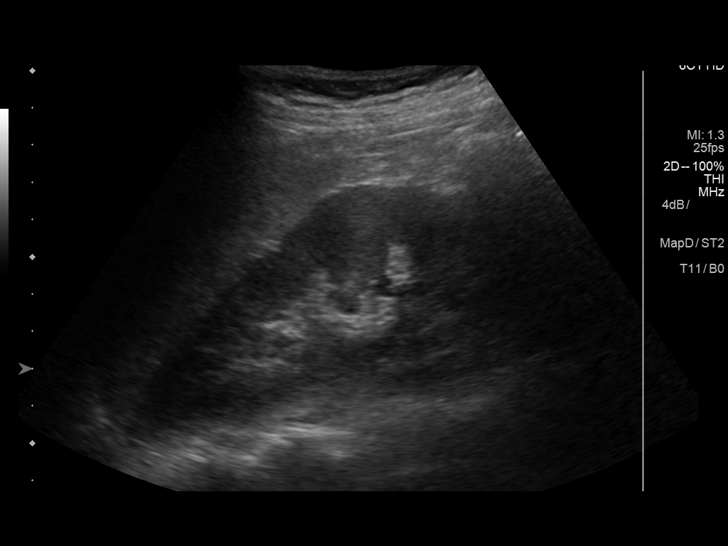
[im 25/40]
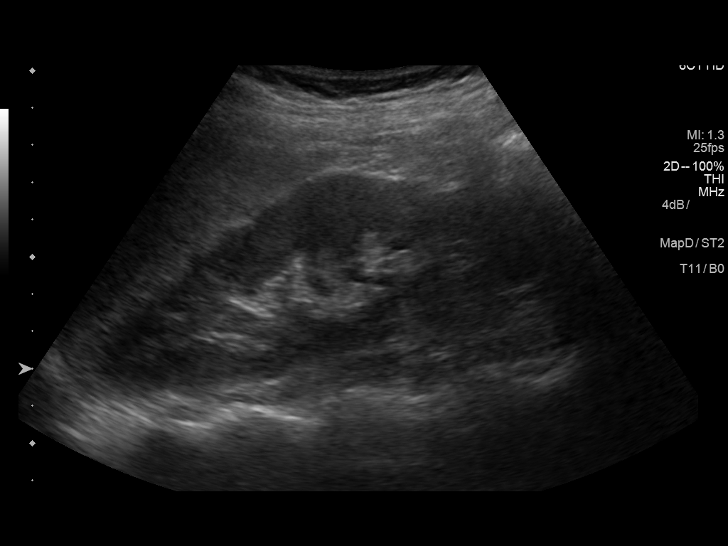
[im 27/40]
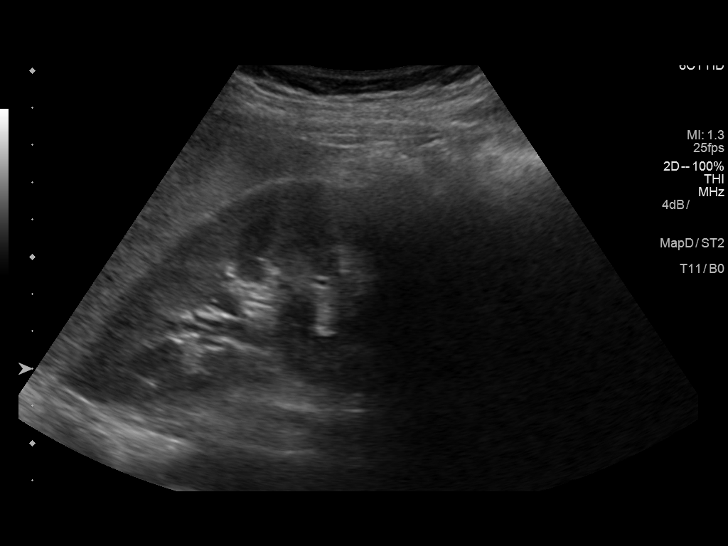
[im 30/40]
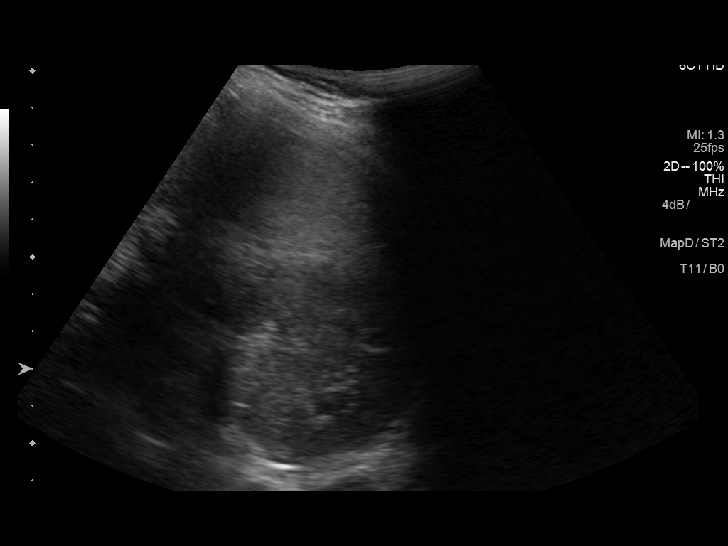
[im 33/40]
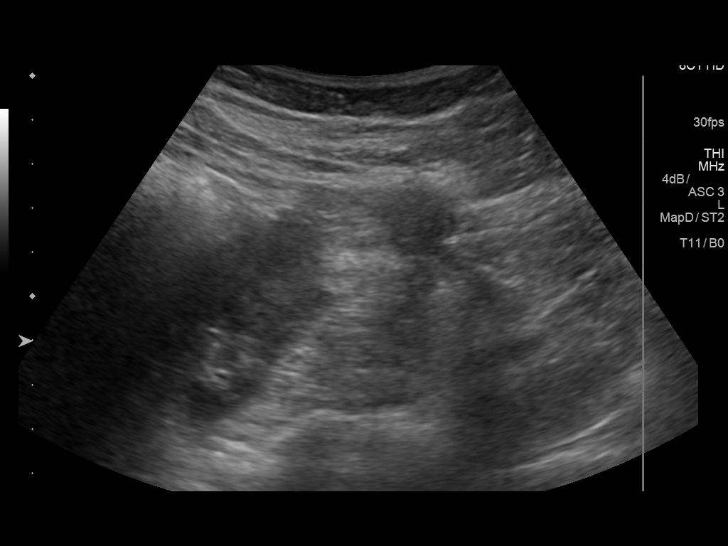
[im 36/40]
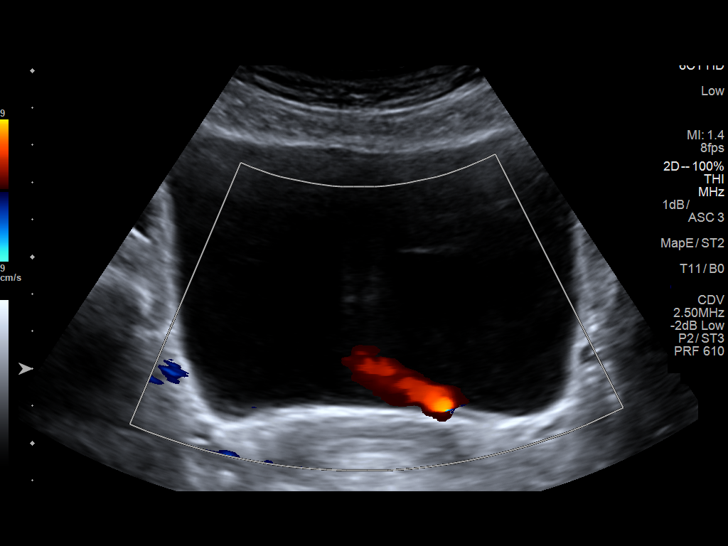
[im 40/40]
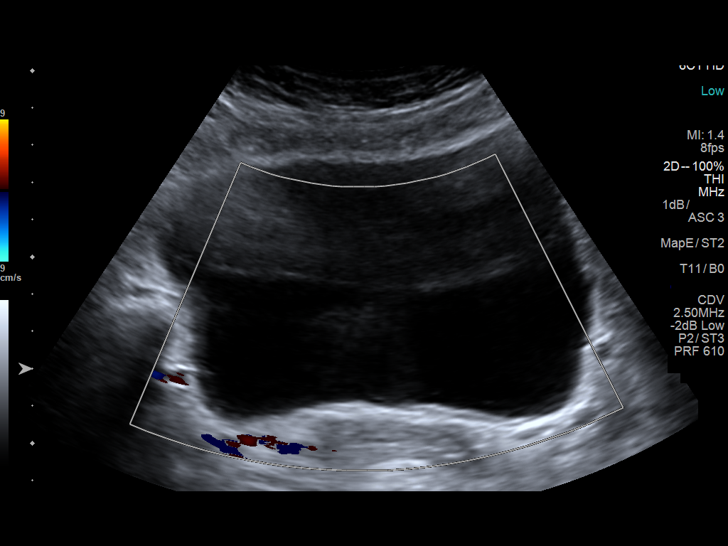

[14 of 25 positions shown; findings below may reference images not displayed]

FINDINGS: Right Kidney:

Length: 13.5 cm. Normal cortical thickness and echogenicity. No
mass, hydronephrosis or shadowing calcification.

Left Kidney:

Length: 13.4 cm. Normal cortical thickness and echogenicity. No
mass, hydronephrosis or shadowing calcification.

Bladder:

Normally distended without mass or wall thickening. LEFT ureteral
jet visualized. RIGHT ureteral jet was not visualized during
imaging.
IMPRESSION: Normal sonographic appearance of the kidneys bilaterally.

No RIGHT ureteral jet was visualized during imaging, but no
RIGHT-side hydronephrosis is identified.

## 2016-11-03 ENCOUNTER — Ambulatory Visit: Payer: Medicaid Other | Admitting: Women's Health

## 2016-11-08 ENCOUNTER — Encounter: Payer: Self-pay | Admitting: *Deleted

## 2016-11-08 ENCOUNTER — Ambulatory Visit: Payer: Medicaid Other | Admitting: Women's Health

## 2016-11-16 IMAGING — CR DG LUMBAR SPINE 2-3V
3 series · 3 of 3 positions shown · non-contrast
Comparison: 11/20/2014

CLINICAL DATA: New onset seizure with fall and low back pain
following fall

EXAM:
LUMBAR SPINE - 3 VIEW

[l-spine ap]
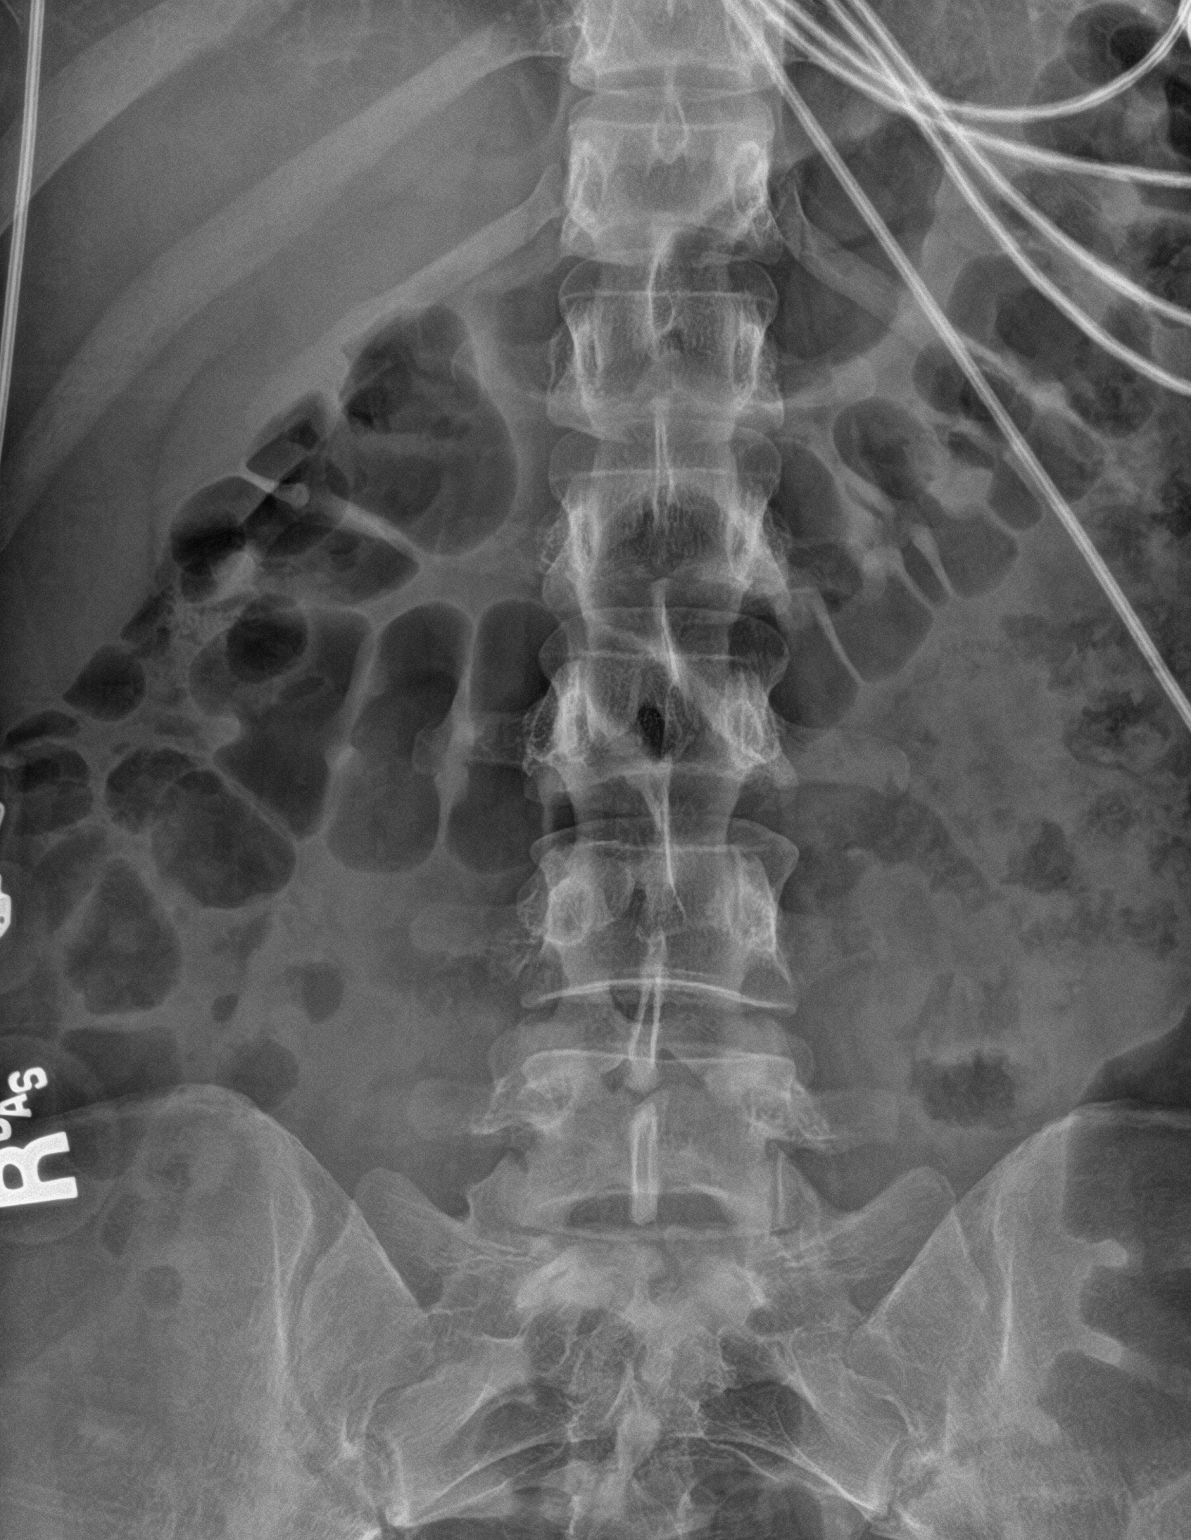

[l-spine lat]
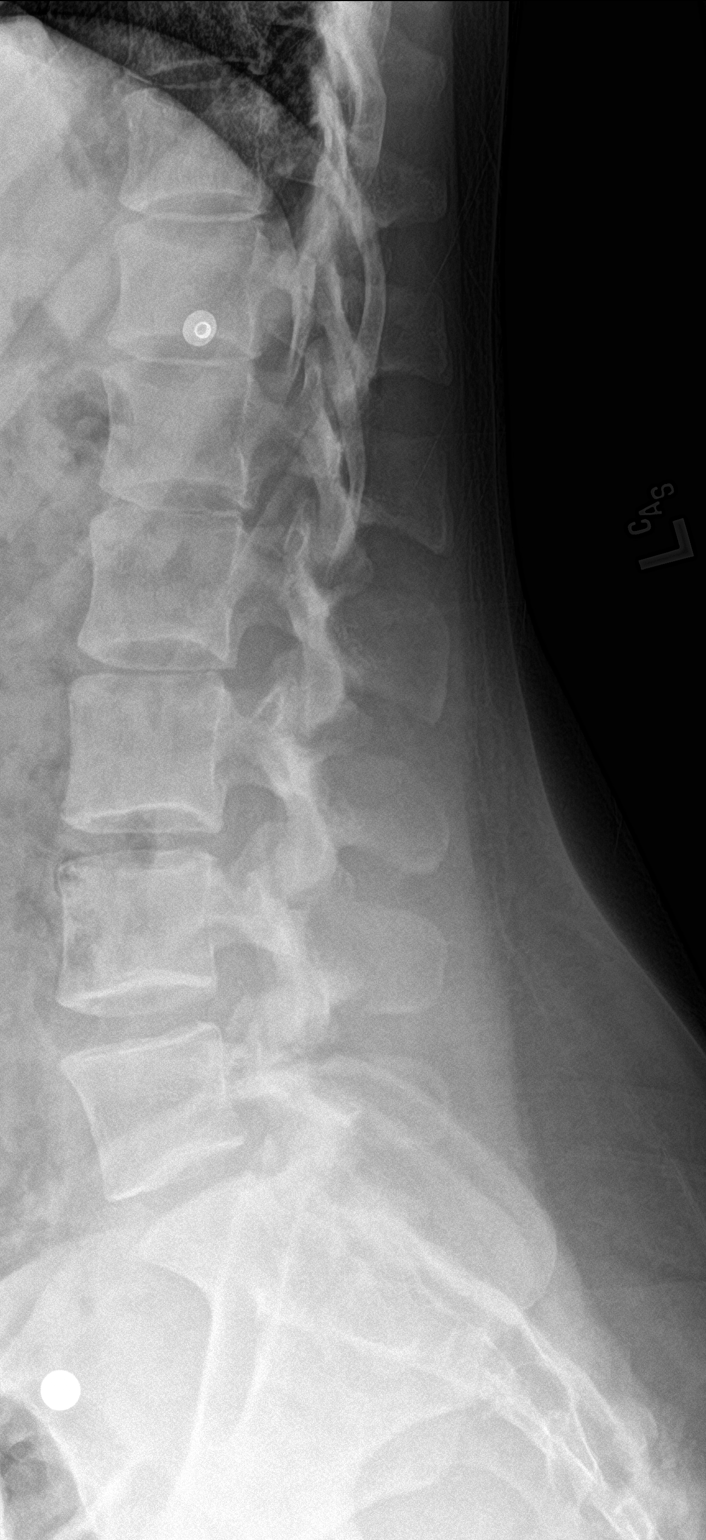

[l-spine spot]
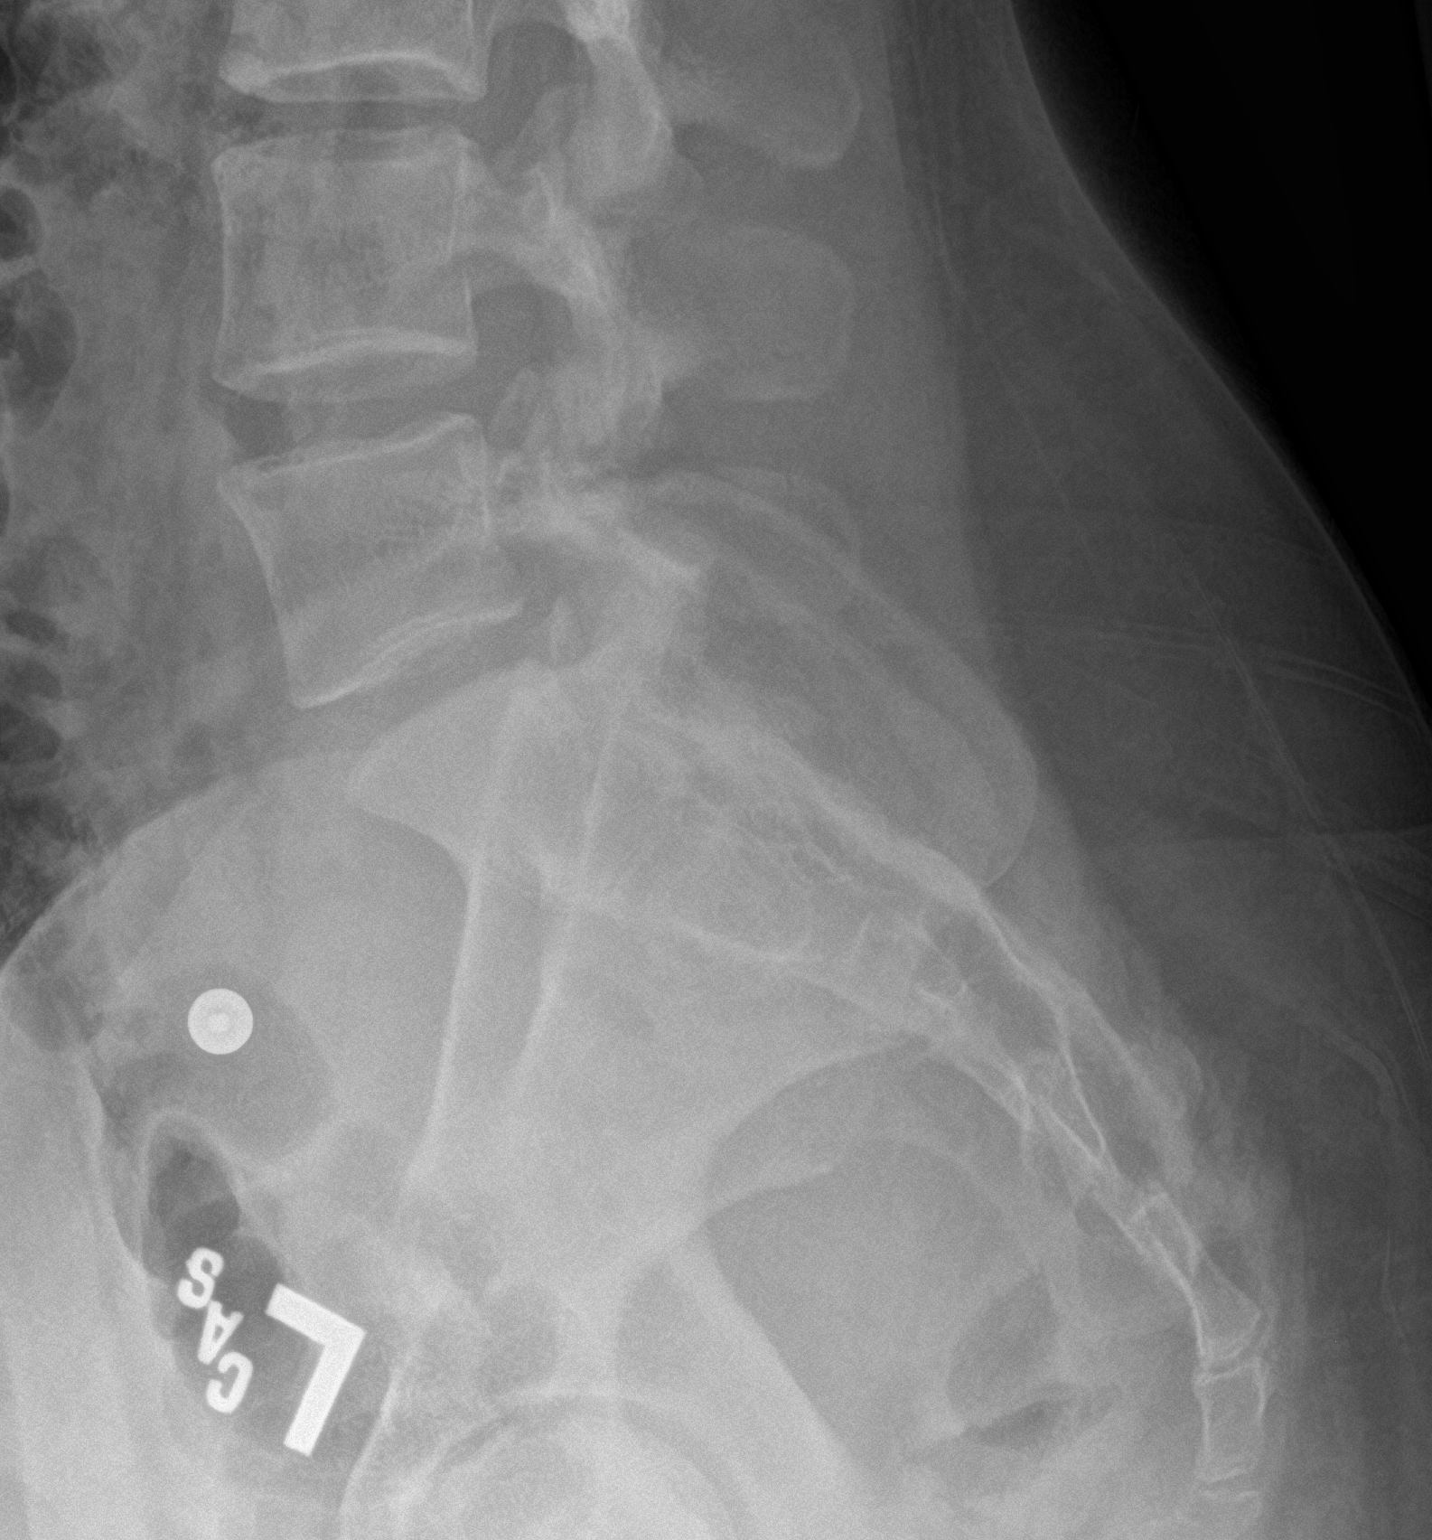

[3 of 3 positions shown; findings below may reference images not displayed]

FINDINGS: Five lumbar type vertebral bodies are well visualized. Vertebral
body height is well maintained. No acute fracture is noted. No soft
tissue abnormality is seen.
IMPRESSION: No acute abnormality noted.

## 2017-03-08 ENCOUNTER — Encounter: Payer: Self-pay | Admitting: *Deleted

## 2017-03-08 ENCOUNTER — Ambulatory Visit: Payer: Medicaid Other | Admitting: Women's Health

## 2017-03-15 ENCOUNTER — Encounter: Payer: Self-pay | Admitting: Women's Health

## 2017-03-15 ENCOUNTER — Ambulatory Visit (INDEPENDENT_AMBULATORY_CARE_PROVIDER_SITE_OTHER): Payer: Medicaid Other | Admitting: Women's Health

## 2017-03-15 VITALS — BP 110/80 | HR 80 | Wt 183.0 lb

## 2017-03-15 DIAGNOSIS — Z3490 Encounter for supervision of normal pregnancy, unspecified, unspecified trimester: Secondary | ICD-10-CM

## 2017-03-15 DIAGNOSIS — Z3201 Encounter for pregnancy test, result positive: Secondary | ICD-10-CM

## 2017-03-15 DIAGNOSIS — R110 Nausea: Secondary | ICD-10-CM | POA: Diagnosis not present

## 2017-03-15 DIAGNOSIS — Z3491 Encounter for supervision of normal pregnancy, unspecified, first trimester: Secondary | ICD-10-CM

## 2017-03-15 DIAGNOSIS — N926 Irregular menstruation, unspecified: Secondary | ICD-10-CM

## 2017-03-15 LAB — POCT URINE PREGNANCY: PREG TEST UR: POSITIVE — AB

## 2017-03-15 MED ORDER — CITALOPRAM HYDROBROMIDE 20 MG PO TABS: 20.0000 mg | ORAL_TABLET | Freq: Every day | ORAL | 6 refills | Status: DC

## 2017-03-15 MED ORDER — CITRANATAL ASSURE 35-1 & 300 MG PO MISC: ORAL | 11 refills | Status: AC

## 2017-03-15 NOTE — Patient Instructions (Signed)

## 2017-03-15 NOTE — Progress Notes (Signed)
   [facility] Visit  Patient name: Denise Sandoval Pearse MRN [id number]  Date of birth: Dec 31, 1995  CC & HPI:  Denise Sandoval Buelna is a 21 y.o. [id number] Caucasian female presenting today for +PT. Certain LMP 01/16/17. Some am nausea. On suboxone 8mg  BID by Sherre LainMargaret Bowen in [place]-goes to see her today. Really wants to wean off of it for this pregnancy as her last baby did have some withdrawals. Last baby born 09/27/16. Was not planning another pregnancy. Never came for postpartum visit. Was not on birth control. Not taking pnv-wants rx. H/O depression/anxiety, on celexa during last pregnancy, stopped during pregnancy b/c she thought she felt better, feels like she needs to restart. Denies SI/HI/II.  Patient's last menstrual period was 01/16/2017.  Pertinent History Reviewed:  Medical & Surgical Hx:   Past medical, surgical, family, and social history reviewed in electronic medical record Medications: Reviewed & Updated - see associated section Allergies: Reviewed in electronic medical record  Objective Findings:  Vitals: BP 110/80   Pulse 80   Wt 183 lb (83 kg)   LMP 01/16/2017   BMI 27.83 kg/m  Body mass index is 27.83 kg/m.  Physical Examination: General appearance - alert, well appearing, and in no distress  Results for orders placed or performed in visit on 03/15/17 (from the past 24 hour(s))  POCT urine pregnancy   Collection Time: 03/15/17  2:48 PM  Result Value Ref Range   Preg Test, Ur Positive (A) Negative     Assessment & Plan:  A:   2931w2d pregnant by LMP  On suboxone 8mg  BID  Depression/anxiety  P:  Rx citranatal w/ 11RF  To discuss weaning suboxone (per her request) w/ her provider today at her visit  Rx celexa 20mg  daily  Return in about 1 week (around 03/22/2017) for dating u/s.  Marge DuncansBooker, Thelda Gagan Randall CNM, [facility]NP-BC 03/15/2017 3:17 PM

## 2017-03-20 ENCOUNTER — Other Ambulatory Visit: Payer: Self-pay | Admitting: Women's Health

## 2017-03-20 ENCOUNTER — Ambulatory Visit (INDEPENDENT_AMBULATORY_CARE_PROVIDER_SITE_OTHER): Payer: Medicaid Other

## 2017-03-20 DIAGNOSIS — Z3491 Encounter for supervision of normal pregnancy, unspecified, first trimester: Secondary | ICD-10-CM | POA: Diagnosis not present

## 2017-03-20 NOTE — Progress Notes (Signed)
US 6+6 wks,single IUP w/ys,positive fht 119 bpm,crl  9 mm,EDD 11/07/2017

## 2017-03-22 ENCOUNTER — Other Ambulatory Visit: Payer: Medicaid Other

## 2017-04-11 ENCOUNTER — Encounter: Payer: Self-pay | Admitting: Advanced Practice Midwife

## 2017-04-11 ENCOUNTER — Ambulatory Visit (INDEPENDENT_AMBULATORY_CARE_PROVIDER_SITE_OTHER): Payer: Medicaid Other | Admitting: Advanced Practice Midwife

## 2017-04-11 VITALS — BP 100/60 | HR 82 | Temp 98.6°F | Wt 178.3 lb

## 2017-04-11 DIAGNOSIS — O99345 Other mental disorders complicating the puerperium: Secondary | ICD-10-CM

## 2017-04-11 DIAGNOSIS — Z3A10 10 weeks gestation of pregnancy: Secondary | ICD-10-CM | POA: Diagnosis not present

## 2017-04-11 DIAGNOSIS — O9989 Other specified diseases and conditions complicating pregnancy, childbirth and the puerperium: Secondary | ICD-10-CM

## 2017-04-11 DIAGNOSIS — Z3490 Encounter for supervision of normal pregnancy, unspecified, unspecified trimester: Secondary | ICD-10-CM

## 2017-04-11 DIAGNOSIS — F1290 Cannabis use, unspecified, uncomplicated: Secondary | ICD-10-CM

## 2017-04-11 DIAGNOSIS — Z3682 Encounter for antenatal screening for nuchal translucency: Secondary | ICD-10-CM

## 2017-04-11 DIAGNOSIS — Z1389 Encounter for screening for other disorder: Secondary | ICD-10-CM

## 2017-04-11 DIAGNOSIS — Z283 Underimmunization status: Secondary | ICD-10-CM

## 2017-04-11 DIAGNOSIS — O99321 Drug use complicating pregnancy, first trimester: Secondary | ICD-10-CM

## 2017-04-11 DIAGNOSIS — F53 Postpartum depression: Secondary | ICD-10-CM | POA: Insufficient documentation

## 2017-04-11 DIAGNOSIS — Z3481 Encounter for supervision of other normal pregnancy, first trimester: Secondary | ICD-10-CM

## 2017-04-11 DIAGNOSIS — R569 Unspecified convulsions: Secondary | ICD-10-CM

## 2017-04-11 DIAGNOSIS — Z331 Pregnant state, incidental: Secondary | ICD-10-CM

## 2017-04-11 DIAGNOSIS — O09899 Supervision of other high risk pregnancies, unspecified trimester: Secondary | ICD-10-CM

## 2017-04-11 LAB — POCT URINALYSIS DIPSTICK
Blood, UA: NEGATIVE
GLUCOSE UA: NEGATIVE
LEUKOCYTES UA: NEGATIVE

## 2017-04-11 MED ORDER — SULFAMETHOXAZOLE-TRIMETHOPRIM 800-160 MG PO TABS: 1.0000 | ORAL_TABLET | Freq: Two times a day (BID) | ORAL | 0 refills | Status: DC

## 2017-04-11 MED ORDER — DOXYLAMINE-PYRIDOXINE 10-10 MG PO TBEC: DELAYED_RELEASE_TABLET | ORAL | 3 refills | Status: DC

## 2017-04-11 NOTE — Patient Instructions (Signed)
 First Trimester of Pregnancy The first trimester of pregnancy is from week 1 until the end of week 12 (months 1 through 3). A week after a sperm fertilizes an egg, the egg will implant on the wall of the uterus. This embryo will begin to develop into a baby. Genes from you and your partner are forming the baby. The female genes determine whether the baby is a boy or a girl. At 6-8 weeks, the eyes and face are formed, and the heartbeat can be seen on ultrasound. At the end of 12 weeks, all the baby's organs are formed.  Now that you are pregnant, you will want to do everything you can to have a healthy baby. Two of the most important things are to get good prenatal care and to follow your health care provider's instructions. Prenatal care is all the medical care you receive before the baby's birth. This care will help prevent, find, and treat any problems during the pregnancy and childbirth. BODY CHANGES Your body goes through many changes during pregnancy. The changes vary from woman to woman.   You may gain or lose a couple of pounds at first.  You may feel sick to your stomach (nauseous) and throw up (vomit). If the vomiting is uncontrollable, call your health care provider.  You may tire easily.  You may develop headaches that can be relieved by medicines approved by your health care provider.  You may urinate more often. Painful urination may mean you have a bladder infection.  You may develop heartburn as a result of your pregnancy.  You may develop constipation because certain hormones are causing the muscles that push waste through your intestines to slow down.  You may develop hemorrhoids or swollen, bulging veins (varicose veins).  Your breasts may begin to grow larger and become tender. Your nipples may stick out more, and the tissue that surrounds them (areola) may become darker.  Your gums may bleed and may be sensitive to brushing and flossing.  Dark spots or blotches  (chloasma, mask of pregnancy) may develop on your face. This will likely fade after the baby is born.  Your menstrual periods will stop.  You may have a loss of appetite.  You may develop cravings for certain kinds of food.  You may have changes in your emotions from day to day, such as being excited to be pregnant or being concerned that something may go wrong with the pregnancy and baby.  You may have more vivid and strange dreams.  You may have changes in your hair. These can include thickening of your hair, rapid growth, and changes in texture. Some women also have hair loss during or after pregnancy, or hair that feels dry or thin. Your hair will most likely return to normal after your baby is born. WHAT TO EXPECT AT YOUR PRENATAL VISITS During a routine prenatal visit:  You will be weighed to make sure you and the baby are growing normally.  Your blood pressure will be taken.  Your abdomen will be measured to track your baby's growth.  The fetal heartbeat will be listened to starting around week 10 or 12 of your pregnancy.  Test results from any previous visits will be discussed. Your health care provider may ask you:  How you are feeling.  If you are feeling the baby move.  If you have had any abnormal symptoms, such as leaking fluid, bleeding, severe headaches, or abdominal cramping.  If you have any questions. Other   tests that may be performed during your first trimester include:  Blood tests to find your blood type and to check for the presence of any previous infections. They will also be used to check for low iron levels (anemia) and Rh antibodies. Later in the pregnancy, blood tests for diabetes will be done along with other tests if problems develop.  Urine tests to check for infections, diabetes, or protein in the urine.  An ultrasound to confirm the proper growth and development of the baby.  An amniocentesis to check for possible genetic problems.  Fetal  screens for spina bifida and Down syndrome.  You may need other tests to make sure you and the baby are doing well. HOME CARE INSTRUCTIONS  Medicines  Follow your health care provider's instructions regarding medicine use. Specific medicines may be either safe or unsafe to take during pregnancy.  Take your prenatal vitamins as directed.  If you develop constipation, try taking a stool softener if your health care provider approves. Diet  Eat regular, well-balanced meals. Choose a variety of foods, such as meat or vegetable-based protein, fish, milk and low-fat dairy products, vegetables, fruits, and whole grain breads and cereals. Your health care provider will help you determine the amount of weight gain that is right for you.  Avoid raw meat and uncooked cheese. These carry germs that can cause birth defects in the baby.  Eating four or five small meals rather than three large meals a day may help relieve nausea and vomiting. If you start to feel nauseous, eating a few soda crackers can be helpful. Drinking liquids between meals instead of during meals also seems to help nausea and vomiting.  If you develop constipation, eat more high-fiber foods, such as fresh vegetables or fruit and whole grains. Drink enough fluids to keep your urine clear or pale yellow. Activity and Exercise  Exercise only as directed by your health care provider. Exercising will help you:  Control your weight.  Stay in shape.  Be prepared for labor and delivery.  Experiencing pain or cramping in the lower abdomen or low back is a good sign that you should stop exercising. Check with your health care provider before continuing normal exercises.  Try to avoid standing for long periods of time. Move your legs often if you must stand in one place for a long time.  Avoid heavy lifting.  Wear low-heeled shoes, and practice good posture.  You may continue to have sex unless your health care provider directs you  otherwise. Relief of Pain or Discomfort  Wear a good support bra for breast tenderness.   Take warm sitz baths to soothe any pain or discomfort caused by hemorrhoids. Use hemorrhoid cream if your health care provider approves.   Rest with your legs elevated if you have leg cramps or low back pain.  If you develop varicose veins in your legs, wear support hose. Elevate your feet for 15 minutes, 3-4 times a day. Limit salt in your diet. Prenatal Care  Schedule your prenatal visits by the twelfth week of pregnancy. They are usually scheduled monthly at first, then more often in the last 2 months before delivery.  Write down your questions. Take them to your prenatal visits.  Keep all your prenatal visits as directed by your health care provider. Safety  Wear your seat belt at all times when driving.  Make a list of emergency phone numbers, including numbers for family, friends, the hospital, and police and fire departments. General   Tips  Ask your health care provider for a referral to a local prenatal education class. Begin classes no later than at the beginning of month 6 of your pregnancy.  Ask for help if you have counseling or nutritional needs during pregnancy. Your health care provider can offer advice or refer you to specialists for help with various needs.  Do not use hot tubs, steam rooms, or saunas.  Do not douche or use tampons or scented sanitary pads.  Do not cross your legs for long periods of time.  Avoid cat litter boxes and soil used by cats. These carry germs that can cause birth defects in the baby and possibly loss of the fetus by miscarriage or stillbirth.  Avoid all smoking, herbs, alcohol, and medicines not prescribed by your health care provider. Chemicals in these affect the formation and growth of the baby.  Schedule a dentist appointment. At home, brush your teeth with a soft toothbrush and be gentle when you floss. SEEK MEDICAL CARE IF:   You have  dizziness.  You have mild pelvic cramps, pelvic pressure, or nagging pain in the abdominal area.  You have persistent nausea, vomiting, or diarrhea.  You have a bad smelling vaginal discharge.  You have pain with urination.  You notice increased swelling in your face, hands, legs, or ankles. SEEK IMMEDIATE MEDICAL CARE IF:   You have a fever.  You are leaking fluid from your vagina.  You have spotting or bleeding from your vagina.  You have severe abdominal cramping or pain.  You have rapid weight gain or loss.  You vomit blood or material that looks like coffee grounds.  You are exposed to German measles and have never had them.  You are exposed to fifth disease or chickenpox.  You develop a severe headache.  You have shortness of breath.  You have any kind of trauma, such as from a fall or a car accident. Document Released: 08/01/2001 Document Revised: 12/22/2013 Document Reviewed: 06/17/2013 Exit[employer] Patient Information 2015 [employer]. This information is not intended to replace advice given to you by your health care provider. Make sure you discuss any questions you have with your health care provider.   Nausea & Vomiting  Have saltine crackers or pretzels by your bed and eat a few bites before you raise your head out of bed in the morning  Eat small frequent meals throughout the day instead of large meals  Drink plenty of fluids throughout the day to stay hydrated, just don't drink a lot of fluids with your meals.  This can make your stomach fill up faster making you feel sick  Do not brush your teeth right after you eat  Products with real ginger are good for nausea, like ginger ale and ginger hard candy Make sure it says made with real ginger!  Sucking on sour candy like lemon heads is also good for nausea  If your prenatal vitamins make you nauseated, take them at night so you will sleep through the nausea  Sea Bands  If you feel like you need  medicine for the nausea & vomiting please let us know  If you are unable to keep any fluids or food down please let us know   Constipation  Drink plenty of fluid, preferably water, throughout the day  Eat foods high in fiber such as fruits, vegetables, and grains  Exercise, such as walking, is a good way to keep your bowels regular  Drink warm fluids, especially warm   prune juice, or decaf coffee  Eat a 1/2 cup of real oatmeal (not instant), 1/2 cup applesauce, and 1/2-1 cup warm prune juice every day  If needed, you may take Colace (docusate sodium) stool softener once or twice a day to help keep the stool soft. If you are pregnant, wait until you are out of your first trimester (12-14 weeks of pregnancy)  If you still are having problems with constipation, you may take Miralax once daily as needed to help keep your bowels regular.  If you are pregnant, wait until you are out of your first trimester (12-14 weeks of pregnancy)  Safe Medications in Pregnancy   Acne: Benzoyl Peroxide Salicylic Acid  Backache/Headache: Tylenol: 2 regular strength every 4 hours OR              2 Extra strength every 6 hours  Colds/Coughs/Allergies: Benadryl (alcohol free) 25 mg every 6 hours as needed Breath right strips Claritin Cepacol throat lozenges Chloraseptic throat spray Cold-Eeze- up to three times per day Cough drops, alcohol free Flonase (by prescription only) Guaifenesin Mucinex Robitussin DM (plain only, alcohol free) Saline nasal spray/drops Sudafed (pseudoephedrine) & Actifed ** use only after [redacted] weeks gestation and if you do not have high blood pressure Tylenol Vicks Vaporub Zinc lozenges Zyrtec   Constipation: Colace Ducolax suppositories Fleet enema Glycerin suppositories Metamucil Milk of magnesia Miralax Senokot Smooth move tea  Diarrhea: Kaopectate Imodium A-D  *NO pepto Bismol  Hemorrhoids: Anusol Anusol HC Preparation  H Tucks  Indigestion: Tums Maalox Mylanta Zantac  Pepcid  Insomnia: Benadryl (alcohol free) 25mg every 6 hours as needed Tylenol PM Unisom, no Gelcaps  Leg Cramps: Tums MagGel  Nausea/Vomiting:  Bonine Dramamine Emetrol Ginger extract Sea bands Meclizine  Nausea medication to take during pregnancy:  Unisom (doxylamine succinate 25 mg tablets) Take one tablet daily at bedtime. If symptoms are not adequately controlled, the dose can be increased to a maximum recommended dose of two tablets daily (1/2 tablet in the morning, 1/2 tablet mid-afternoon and one at bedtime). Vitamin B6 100mg tablets. Take one tablet twice a day (up to 200 mg per day).  Skin Rashes: Aveeno products Benadryl cream or 25mg every 6 hours as needed Calamine Lotion 1% cortisone cream  Yeast infection: Gyne-lotrimin 7 Monistat 7   **If taking multiple medications, please check labels to avoid duplicating the same active ingredients **take medication as directed on the label ** Do not exceed 4000 mg of tylenol in 24 hours **Do not take medications that contain aspirin or ibuprofen      

## 2017-04-11 NOTE — Progress Notes (Signed)
Subjective:    Denise Sandoval is a [id number] [redacted]w[redacted]d being seen today for her first obstetrical visit.  Her obstetrical history is significant for 2 TERM SVD's, last one 6 months ago.  Pregnancy history fully reviewed.  Patient reports nausea and vomiting. Also has some dc with itch.  Some dysuria, no fever/CVAT She discussed weaning off subutex w/prescribing doctor (last baby had moderate w/d sx that she wants to avoid if possible). Taking 1/2 8mg  tablet daily.  Knows not to abruptly stop.  Very motivated not to be on any substances.  Attends therapy sessions.  Pt commended and encouraged.  Also discussed that subutex can be very hard to come off of, and if she isn't able to then that's OK.   Vitals:   04/11/17 1506  BP: 100/60  Pulse: 82  Temp: 98.6 F (37 C)  Weight: 178 lb 4.8 oz (80.9 kg)    HISTORY: OB History  Gravida Para Term Preterm AB Living  3 2 2     2   SAB TAB Ectopic Multiple Live Births        0 2    # Outcome Date GA Lbr Len/2nd Weight Sex Delivery Anes PTL Lv  3 Current           2 Term 09/27/16 [redacted]w[redacted]d 20:36 / 00:22 7 lb 6.2 oz (3.351 kg) M Vag-Spont EPI, Local N LIV  1 Term 05/07/14 [redacted]w[redacted]d 05:38 / 01:16 8 lb 6.4 oz (3.81 kg) F Vag-Spont EPI N LIV     Past Medical History:  Diagnosis Date  . Anxiety   . Depression   . Hypothyroidism   . Kidney infection   . Opiate use   . Postpartum endometritis 05/13/2014  . RLQ abdominal pain 05/13/2014  . Seizures (HCC)   . Supervision of normal first pregnancy in first trimester 09/23/2013   Past Surgical History:  Procedure Laterality Date  . WISDOM TOOTH EXTRACTION     Family History  Problem Relation Age of Onset  . Alcohol abuse Mother   . Alcohol abuse Father   . Hypertension Maternal Grandmother   . Alcohol abuse Maternal Grandfather   . Alcohol abuse Paternal Grandfather   . Cancer - Lung Maternal Aunt      Exam       Pelvic Exam:    Perineum: Normal Perineum   Vulva: normal    Vagina:  normal mucosa, discharge c/w yeast, no palpable nodules   Uterus Normal, Gravid, FH: 10     Cervix: normal   Adnexa: Not palpable   Urinary:  urethral meatus normal    System:     Skin: normal coloration and turgor, no rashes    Neurologic: oriented, normal, normal mood   Extremities: normal strength, tone, and muscle mass   HEENT PERRLA   Mouth/Teeth mucous membranes moist, normal dentition   Neck supple and no masses   Cardiovascular: regular rate and rhythm   Respiratory:  appears well, vitals normal, no respiratory distress, acyanotic   Abdomen: soft, non-tender;  FHR: 160 [place]          Assessment:    Pregnancy: [id number] Patient Active Problem List   Diagnosis Date Noted  . Post partum depression 04/11/2017  . Supervision of normal pregnancy 04/11/2017  . Seizure (HCC) 07/14/2016  . History of chlamydia 04/17/2016  . MDD (major depressive disorder), recurrent severe, without psychosis (HCC) 04/30/2015  . Alcohol use disorder, moderate, dependence (HCC) 04/30/2015  . Opioid use disorder, moderate, dependence (HCC)  04/30/2015  . Moderate benzodiazepine use disorder (HCC) 04/30/2015  . Major depressive disorder 04/25/2015  . Post traumatic stress disorder (PTSD) 01/16/2015  . Marijuana use 01/14/2014  . Rubella non-immune status, antepartum 01/14/2014  . Rh negative state in antepartum period 01/14/2014        Plan:     Initial labs drawn. Continue prenatal vitamins  rx diclegis (prior auth done), septra, and get OTC cream for yeast.  Problem list reviewed and updated  Reviewed n/v relief measures and warning s/s to report  Reviewed recommended weight gain based on pre-gravid BMI  Encouraged well-balanced diet Genetic Screening discussed Integrated Screen: requested.  Ultrasound discussed; fetal survey: requested.  Return in about 2 weeks (around 04/25/2017) for LROB, US:NT+1st  IT.  CRESENZO-DISHMAN,Dio Giller 04/11/2017

## 2017-04-12 LAB — URINALYSIS, ROUTINE W REFLEX MICROSCOPIC
Bilirubin, UA: NEGATIVE
Glucose, UA: NEGATIVE
Nitrite, UA: NEGATIVE
PH UA: 7 (ref 5.0–7.5)
PROTEIN UA: NEGATIVE
RBC, UA: NEGATIVE
Specific Gravity, UA: 1.024 (ref 1.005–1.030)
Urobilinogen, Ur: 0.2 mg/dL (ref 0.2–1.0)

## 2017-04-12 LAB — PMP SCREEN PROFILE (10S), URINE
Amphetamine Scrn, Ur: NEGATIVE ng/mL
BARBITURATE SCREEN URINE: NEGATIVE ng/mL
BENZODIAZEPINE SCREEN, URINE: NEGATIVE ng/mL
CANNABINOIDS UR QL SCN: POSITIVE ng/mL — AB
CREATININE(CRT), U: 186.4 mg/dL (ref 20.0–300.0)
Cocaine (Metab) Scrn, Ur: NEGATIVE ng/mL
METHADONE SCREEN, URINE: NEGATIVE ng/mL
OXYCODONE+OXYMORPHONE UR QL SCN: NEGATIVE ng/mL
Opiate Scrn, Ur: NEGATIVE ng/mL
PH UR, DRUG SCRN: 7 (ref 4.5–8.9)
PROPOXYPHENE SCREEN URINE: NEGATIVE ng/mL
Phencyclidine Qn, Ur: NEGATIVE ng/mL

## 2017-04-12 LAB — ANTIBODY SCREEN: Antibody Screen: NEGATIVE

## 2017-04-12 LAB — CBC
Hematocrit: 39.1 % (ref 34.0–46.6)
Hemoglobin: 12.9 g/dL (ref 11.1–15.9)
MCH: 28.9 pg (ref 26.6–33.0)
MCHC: 33 g/dL (ref 31.5–35.7)
MCV: 88 fL (ref 79–97)
PLATELETS: 387 10*3/uL — AB (ref 150–379)
RBC: 4.47 x10E6/uL (ref 3.77–5.28)
RDW: 15.2 % (ref 12.3–15.4)
WBC: 12.7 10*3/uL — AB (ref 3.4–10.8)

## 2017-04-12 LAB — HEPATITIS B SURFACE ANTIGEN: HEP B S AG: NEGATIVE

## 2017-04-12 LAB — HIV ANTIBODY (ROUTINE TESTING W REFLEX): HIV Screen 4th Generation wRfx: NONREACTIVE

## 2017-04-12 LAB — MICROSCOPIC EXAMINATION
CASTS: NONE SEEN /LPF
RBC MICROSCOPIC, UA: NONE SEEN /HPF (ref 0–?)

## 2017-04-12 LAB — RPR: RPR Ser Ql: NONREACTIVE

## 2017-04-13 LAB — GC/CHLAMYDIA PROBE AMP
Chlamydia trachomatis, NAA: NEGATIVE
NEISSERIA GONORRHOEAE BY PCR: NEGATIVE

## 2017-04-13 LAB — URINE CULTURE

## 2017-04-20 ENCOUNTER — Other Ambulatory Visit: Payer: Self-pay | Admitting: Obstetrics & Gynecology

## 2017-04-20 ENCOUNTER — Telehealth: Payer: Self-pay | Admitting: *Deleted

## 2017-04-20 ENCOUNTER — Other Ambulatory Visit (INDEPENDENT_AMBULATORY_CARE_PROVIDER_SITE_OTHER): Payer: Medicaid Other

## 2017-04-20 ENCOUNTER — Encounter (INDEPENDENT_AMBULATORY_CARE_PROVIDER_SITE_OTHER): Payer: Self-pay

## 2017-04-20 DIAGNOSIS — O3680X0 Pregnancy with inconclusive fetal viability, not applicable or unspecified: Secondary | ICD-10-CM

## 2017-04-20 DIAGNOSIS — Z3401 Encounter for supervision of normal first pregnancy, first trimester: Secondary | ICD-10-CM

## 2017-04-20 NOTE — Progress Notes (Signed)
US 11+2 wks,measurements c/w dates,normal ovaries bilat,crl 49.77 mm,fhr 157 bpm,subchorionic hemorrhage 7.2 x 6.8 x 1.5 cm,Dr Despina HiddenEure discussed results w/pt

## 2017-04-20 NOTE — Telephone Encounter (Addendum)
Boyfriend called stating [facility] and bright red bleeding saturating a pad.  No currently having any bleeding or cramping. Stated they did not do an ultrasound but was told to follow-up with us. Will request records and get back to patient.   Spoke to Dr Despina HiddenEure. States patient needs U/S today.   Patient notified. Will work in this afternoon.

## 2017-04-25 ENCOUNTER — Ambulatory Visit (INDEPENDENT_AMBULATORY_CARE_PROVIDER_SITE_OTHER): Payer: Medicaid Other

## 2017-04-25 ENCOUNTER — Ambulatory Visit (INDEPENDENT_AMBULATORY_CARE_PROVIDER_SITE_OTHER): Payer: Medicaid Other | Admitting: Women's Health

## 2017-04-25 ENCOUNTER — Encounter: Payer: Self-pay | Admitting: Women's Health

## 2017-04-25 VITALS — BP 106/58 | HR 76 | Wt 176.0 lb

## 2017-04-25 DIAGNOSIS — Z331 Pregnant state, incidental: Secondary | ICD-10-CM

## 2017-04-25 DIAGNOSIS — Z3401 Encounter for supervision of normal first pregnancy, first trimester: Secondary | ICD-10-CM

## 2017-04-25 DIAGNOSIS — O2341 Unspecified infection of urinary tract in pregnancy, first trimester: Secondary | ICD-10-CM | POA: Insufficient documentation

## 2017-04-25 DIAGNOSIS — Z3682 Encounter for antenatal screening for nuchal translucency: Secondary | ICD-10-CM

## 2017-04-25 DIAGNOSIS — O418X10 Other specified disorders of amniotic fluid and membranes, first trimester, not applicable or unspecified: Secondary | ICD-10-CM

## 2017-04-25 DIAGNOSIS — Z3A12 12 weeks gestation of pregnancy: Secondary | ICD-10-CM

## 2017-04-25 DIAGNOSIS — Z3481 Encounter for supervision of other normal pregnancy, first trimester: Secondary | ICD-10-CM

## 2017-04-25 DIAGNOSIS — O09891 Supervision of other high risk pregnancies, first trimester: Secondary | ICD-10-CM

## 2017-04-25 DIAGNOSIS — Z1389 Encounter for screening for other disorder: Secondary | ICD-10-CM

## 2017-04-25 DIAGNOSIS — O26899 Other specified pregnancy related conditions, unspecified trimester: Secondary | ICD-10-CM

## 2017-04-25 DIAGNOSIS — O468X1 Other antepartum hemorrhage, first trimester: Secondary | ICD-10-CM

## 2017-04-25 DIAGNOSIS — Z6791 Unspecified blood type, Rh negative: Secondary | ICD-10-CM

## 2017-04-25 DIAGNOSIS — O09899 Supervision of other high risk pregnancies, unspecified trimester: Secondary | ICD-10-CM

## 2017-04-25 DIAGNOSIS — Z1379 Encounter for other screening for genetic and chromosomal anomalies: Secondary | ICD-10-CM

## 2017-04-25 DIAGNOSIS — O360110 Maternal care for anti-D [Rh] antibodies, first trimester, not applicable or unspecified: Secondary | ICD-10-CM

## 2017-04-25 LAB — POCT URINALYSIS DIPSTICK
Glucose, UA: NEGATIVE
Ketones, UA: NEGATIVE
LEUKOCYTES UA: NEGATIVE
Nitrite, UA: NEGATIVE
RBC UA: NEGATIVE

## 2017-04-25 NOTE — Progress Notes (Signed)
Low-risk OB appointment [id number] 6167w0d Estimated Date of Delivery: 11/07/17 BP (!) 106/58   Pulse 76   LMP [id number]/29/2018 (Approximate)   BP, weight, and urine reviewed.  Refer to obstetrical flow sheet for FH & FHR.  No fm yet. Denies cramping, lof, vb, or uti s/s. No complaints. Still on subutex 8mg  BID.  Called 8/31, [facility], received Rhogam> worked into office for u/s that day, was noted to [facility]. No further vb since. To continue pelvic rest for now.  Reviewed today's normal nt u/s, SCH stable. Plan:  Continue routine obstetrical care  F/U in 2wks for OB appointment and [facility] cx poc today

## 2017-04-25 NOTE — Patient Instructions (Signed)

## 2017-04-25 NOTE — Progress Notes (Signed)
US 12 wks,measurements c/w dates,crl 60.98 mm,normal ovaries bilat,subchorionic hemorrhage 7 x 7.4 x 1.4 cm N/C,NB present,NT 1.3 mm,post pl gr 0,fhr 161 bpm

## 2017-04-26 ENCOUNTER — Telehealth: Payer: Self-pay | Admitting: *Deleted

## 2017-04-26 NOTE — Telephone Encounter (Signed)
Pharmacy made aware diclegis was approved. Patient also made aware.

## 2017-04-27 LAB — URINE CULTURE

## 2017-05-01 LAB — INTEGRATED 1
CROWN RUMP LENGTH MAT SCREEN: 60.9 mm
Gest. Age on Collection Date: 12.4 weeks
Maternal Age at EDD: 24.1 yr
Nuchal Translucency (NT): 1.3 mm
Number of Fetuses: 1
PAPP-A Value: 891.3 ng/mL
Weight: 176 [lb_av]

## 2017-05-09 ENCOUNTER — Encounter: Payer: Self-pay | Admitting: Advanced Practice Midwife

## 2017-05-09 ENCOUNTER — Ambulatory Visit (INDEPENDENT_AMBULATORY_CARE_PROVIDER_SITE_OTHER): Payer: Medicaid Other

## 2017-05-09 ENCOUNTER — Ambulatory Visit (INDEPENDENT_AMBULATORY_CARE_PROVIDER_SITE_OTHER): Payer: Medicaid Other | Admitting: Advanced Practice Midwife

## 2017-05-09 VITALS — BP 130/72 | HR 69 | Wt 174.5 lb

## 2017-05-09 DIAGNOSIS — O418X10 Other specified disorders of amniotic fluid and membranes, first trimester, not applicable or unspecified: Secondary | ICD-10-CM

## 2017-05-09 DIAGNOSIS — O418X20 Other specified disorders of amniotic fluid and membranes, second trimester, not applicable or unspecified: Secondary | ICD-10-CM

## 2017-05-09 DIAGNOSIS — Z3482 Encounter for supervision of other normal pregnancy, second trimester: Secondary | ICD-10-CM

## 2017-05-09 DIAGNOSIS — Z363 Encounter for antenatal screening for malformations: Secondary | ICD-10-CM

## 2017-05-09 DIAGNOSIS — Z3A14 14 weeks gestation of pregnancy: Secondary | ICD-10-CM

## 2017-05-09 DIAGNOSIS — O468X9 Other antepartum hemorrhage, unspecified trimester: Secondary | ICD-10-CM

## 2017-05-09 DIAGNOSIS — Z1389 Encounter for screening for other disorder: Secondary | ICD-10-CM

## 2017-05-09 DIAGNOSIS — O418X90 Other specified disorders of amniotic fluid and membranes, unspecified trimester, not applicable or unspecified: Secondary | ICD-10-CM

## 2017-05-09 DIAGNOSIS — Z331 Pregnant state, incidental: Secondary | ICD-10-CM

## 2017-05-09 DIAGNOSIS — O468X2 Other antepartum hemorrhage, second trimester: Secondary | ICD-10-CM

## 2017-05-09 DIAGNOSIS — Z3402 Encounter for supervision of normal first pregnancy, second trimester: Secondary | ICD-10-CM

## 2017-05-09 DIAGNOSIS — O468X1 Other antepartum hemorrhage, first trimester: Secondary | ICD-10-CM

## 2017-05-09 LAB — POCT URINALYSIS DIPSTICK
GLUCOSE UA: NEGATIVE
LEUKOCYTES UA: NEGATIVE
NITRITE UA: NEGATIVE
PROTEIN UA: NEGATIVE

## 2017-05-09 NOTE — Progress Notes (Signed)
[id number] [redacted]w[redacted]d Estimated Date of Delivery: 11/07/17  Blood pressure 130/72, pulse 69, weight 174 lb 8 oz (79.2 kg), last menstrual period 01/16/2017, currently breastfeeding.    Her for 2 week f/u of large [facility].  (7X 7.4 X 1.4) BP weight and urine results all reviewed and noted.  Please refer to the obstetrical flow sheet for the fundal height and fetal heart rate documentation:  [place] 14 wks,measurements c/w dates,subchorionic hemorrhage 9.7 x 8.8 x 1.2 cm,normal ovaries bilat,post pl gr 0,fhr 162 bpm,efw 97 g,cx 3.5 cm  Patient  Has no rupture of membranes symptoms or regular contractions.s[pttomg for 9 days   Patient is without complaints. All questions were answered.  Orders Placed This Encounter  Procedures  . US OB Comp + 14 Wk  . POCT Urinalysis Dipstick    Plan:  Continued routine obstetrical care, pelvic rest  Return in about 4 weeks (around 06/06/2017) for LROB, AV:[id number], 2nd IT.

## 2017-05-09 NOTE — Progress Notes (Signed)
[place] 14 wks,measurements c/w dates,subchorionic hemorrhage 9.7 x 8.8 x 1.2 cm,normal ovaries bilat,post pl gr 0,fhr 162 bpm,efw 97 g,cx 3.5 cm

## 2017-05-10 ENCOUNTER — Other Ambulatory Visit: Payer: Medicaid Other

## 2017-06-06 ENCOUNTER — Encounter: Payer: Medicaid Other | Admitting: Women's Health

## 2017-06-06 ENCOUNTER — Other Ambulatory Visit: Payer: Medicaid Other

## 2017-06-12 ENCOUNTER — Encounter: Payer: Self-pay | Admitting: Advanced Practice Midwife

## 2017-06-12 ENCOUNTER — Ambulatory Visit (INDEPENDENT_AMBULATORY_CARE_PROVIDER_SITE_OTHER): Payer: Medicaid Other | Admitting: Advanced Practice Midwife

## 2017-06-12 ENCOUNTER — Ambulatory Visit (INDEPENDENT_AMBULATORY_CARE_PROVIDER_SITE_OTHER): Payer: Medicaid Other

## 2017-06-12 VITALS — BP 120/60 | HR 88 | Wt 178.0 lb

## 2017-06-12 DIAGNOSIS — R8290 Unspecified abnormal findings in urine: Secondary | ICD-10-CM

## 2017-06-12 DIAGNOSIS — Z3402 Encounter for supervision of normal first pregnancy, second trimester: Secondary | ICD-10-CM

## 2017-06-12 DIAGNOSIS — O468X2 Other antepartum hemorrhage, second trimester: Secondary | ICD-10-CM | POA: Diagnosis not present

## 2017-06-12 DIAGNOSIS — Z363 Encounter for antenatal screening for malformations: Secondary | ICD-10-CM | POA: Diagnosis not present

## 2017-06-12 DIAGNOSIS — Z3A16 16 weeks gestation of pregnancy: Secondary | ICD-10-CM | POA: Diagnosis not present

## 2017-06-12 DIAGNOSIS — O468X9 Other antepartum hemorrhage, unspecified trimester: Principal | ICD-10-CM

## 2017-06-12 DIAGNOSIS — Z3482 Encounter for supervision of other normal pregnancy, second trimester: Secondary | ICD-10-CM

## 2017-06-12 DIAGNOSIS — Z3A18 18 weeks gestation of pregnancy: Secondary | ICD-10-CM

## 2017-06-12 DIAGNOSIS — Z1389 Encounter for screening for other disorder: Secondary | ICD-10-CM

## 2017-06-12 DIAGNOSIS — O418X20 Other specified disorders of amniotic fluid and membranes, second trimester, not applicable or unspecified: Secondary | ICD-10-CM | POA: Diagnosis not present

## 2017-06-12 DIAGNOSIS — Z1379 Encounter for other screening for genetic and chromosomal anomalies: Secondary | ICD-10-CM

## 2017-06-12 DIAGNOSIS — O418X90 Other specified disorders of amniotic fluid and membranes, unspecified trimester, not applicable or unspecified: Secondary | ICD-10-CM

## 2017-06-12 DIAGNOSIS — Z331 Pregnant state, incidental: Secondary | ICD-10-CM

## 2017-06-12 DIAGNOSIS — O289 Unspecified abnormal findings on antenatal screening of mother: Secondary | ICD-10-CM

## 2017-06-12 LAB — POCT URINALYSIS DIPSTICK
Glucose, UA: NEGATIVE
KETONES UA: NEGATIVE
NITRITE UA: POSITIVE
PROTEIN UA: NEGATIVE

## 2017-06-12 MED ORDER — NITROFURANTOIN MONOHYD MACRO 100 MG PO CAPS: 100.0000 mg | ORAL_CAPSULE | Freq: Two times a day (BID) | ORAL | 0 refills | Status: DC

## 2017-06-12 NOTE — Progress Notes (Signed)
US 18+6 wks,cephalic,cx 3.5 cm,post pl gr 0,normal ovaries bilat,subchorionic hemorrhage 8 x 1.1 x 7.5 cm, svp of fluid 3.7 cm,fhr 140 bpm,efw 291 g,anatomy complete,no obvious abnormalities

## 2017-06-12 NOTE — Patient Instructions (Signed)

## 2017-06-12 NOTE — Progress Notes (Signed)
[id number] 2467w6d Estimated Date of Delivery: 11/07/17  Blood pressure 120/60, pulse 88, weight 178 lb (80.7 kg), last menstrual period 01/16/2017, currently breastfeeding.   BP weight and urine results all reviewed and noted.  Please refer to the obstetrical flow sheet for the fundal height and fetal heart rate documentation:  US 18+6 wks,cephalic,cx 3.5 cm,post pl gr 0,normal ovaries bilat,subchorionic hemorrhage 8 x 1.1 x 7.5 cm, svp of fluid 3.7 cm,fhr 140 bpm,efw 291 g,anatomy complete,no obvious abnormalities  Patient reports good fetal movement, denies any bleeding and no rupture of membranes symptoms or regular contractions. Patient is without complaints. All questions were answered.  Orders Placed This Encounter  Procedures  . Urine Culture  . INTEGRATED 2  . POCT urinalysis dipstick    Plan:  Continued routine obstetrical care, 2nd IT today  Return in about 4 weeks (around 07/10/2017) for LROB.

## 2017-06-14 LAB — URINE CULTURE

## 2017-06-18 LAB — INTEGRATED 2
ADSF: 1.14
AFP MoM: 1.51
Alpha-Fetoprotein: 64.1 ng/mL
Crown Rump Length: 60.9 mm
DIA MOM: 1.18
DIA VALUE: 193 pg/mL
Estriol, Unconjugated: 1.71 ng/mL
Gest. Age on Collection Date: 12.4 weeks
Gestational Age: 19.3 weeks
HCG MOM: 1.87
HCG VALUE: 37 [IU]/mL
MATERNAL AGE AT EDD: 24.1 a
NUCHAL TRANSLUCENCY (NT): 1.3 mm
NUCHAL TRANSLUCENCY MOM: 0.84
NUMBER OF FETUSES: 1
PAPP-A MoM: 1.19
PAPP-A VALUE: 891.3 ng/mL
Test Results:: NEGATIVE
WEIGHT: 176 [lb_av]
Weight: 178 [lb_av]

## 2017-07-10 ENCOUNTER — Encounter: Payer: Medicaid Other | Admitting: Advanced Practice Midwife

## 2017-08-21 NOTE — L&D Delivery Note (Addendum)
Delivery Note Progressed to complete dilation during epidural and felt the urge to push.   At 8:19 AM a viable and healthy female was delivered via  (Presentation: ROP ).  APGAR: , ; weight  .   Placenta status:spontaneously and grossly intact with 3 vessel Cord:  with the following complications: none  Anesthesia:  epidural Episiotomy:  none Lacerations:  Tiny perineal  Suture Repair: none Est. Blood Loss (mL):  200 Mom to postpartum.  Baby to Couplet care / Skin to Skin  Placenta to path due to hx abruption.  Denise BourgeoisMarie Thomasenia Sandoval 10/25/2017, 8:37 AM  Please schedule this patient for Postpartum visit in: 4 weeks with the following provider: Any provider For C/S patients schedule nurse incision check in weeks 2 weeks: no High risk pregnancy [facility]:  SVD Anticipated Birth Control:  Plans Interval BTL PP Procedures needed: wants BTL, not sure if signed papers  Schedule Integrated BH visit: yes

## 2017-09-24 ENCOUNTER — Encounter: Payer: Self-pay | Admitting: Obstetrics and Gynecology

## 2017-09-24 ENCOUNTER — Ambulatory Visit (INDEPENDENT_AMBULATORY_CARE_PROVIDER_SITE_OTHER): Payer: Medicaid Other | Admitting: Obstetrics and Gynecology

## 2017-09-24 VITALS — BP 110/60 | HR 96 | Wt 179.0 lb

## 2017-09-24 DIAGNOSIS — Z3403 Encounter for supervision of normal first pregnancy, third trimester: Secondary | ICD-10-CM

## 2017-09-24 DIAGNOSIS — Z331 Pregnant state, incidental: Secondary | ICD-10-CM

## 2017-09-24 DIAGNOSIS — Z1389 Encounter for screening for other disorder: Secondary | ICD-10-CM

## 2017-09-24 DIAGNOSIS — Z3A33 33 weeks gestation of pregnancy: Secondary | ICD-10-CM

## 2017-09-24 LAB — POCT URINALYSIS DIPSTICK
Blood, UA: NEGATIVE
GLUCOSE UA: NEGATIVE
KETONES UA: NEGATIVE
Leukocytes, UA: NEGATIVE
Nitrite, UA: NEGATIVE
Protein, UA: 2

## 2017-09-24 NOTE — Progress Notes (Signed)
[id number]  Estimated Date of Delivery: 11/07/17 LROB 4247w5d  Chief Complaint  Patient presents with  . Routine Prenatal Visit  ____  Patient complaints: None.  [place].  During the time she was up very she stretched her Suboxone/Subutex out and has completely weaned herself from the medication. Patient reports   good fetal movement,                           denies any bleeding , rupture of membranes,or regular contractions.  Blood pressure 110/60, pulse 96, weight 179 lb (81.2 kg), last menstrual period 01/16/2017, currently breastfeeding.   Urine results:notable for 2+ protein we will run urine culture refer to the ob flow sheet for FH and FHR, ,                          Physical Examination: General appearance - alert, well appearing, and in no distress                                      Abdomen - FH 35,                                                         -FHR 142                                                         soft, nontender, nondistended, no masses or organomegaly                                      Pelvic -                                             Questions were answered. Assessment: LROB G3P2002 @ 5547w5d, status post Suboxone therapy, discontinued herself at home  Plan:  Continued routine obstetrical care, low risk OB at this point.  Needs 28-week labs this week  F/u in this weeks for prenatal 28-week labs

## 2017-09-25 ENCOUNTER — Other Ambulatory Visit: Payer: Medicaid Other

## 2017-09-27 ENCOUNTER — Other Ambulatory Visit: Payer: Medicaid Other

## 2017-10-01 ENCOUNTER — Other Ambulatory Visit: Payer: Medicaid Other

## 2017-10-02 LAB — GLUCOSE TOLERANCE, 2 HOURS W/ 1HR
Glucose, 1 hour: 111 mg/dL (ref 65–179)
Glucose, 2 hour: 71 mg/dL (ref 65–152)
Glucose, Fasting: 69 mg/dL (ref 65–91)

## 2017-10-02 LAB — HIV ANTIBODY (ROUTINE TESTING W REFLEX): HIV Screen 4th Generation wRfx: NONREACTIVE

## 2017-10-02 LAB — ANTIBODY SCREEN: Antibody Screen: NEGATIVE

## 2017-10-02 LAB — CBC
HEMOGLOBIN: 10.2 g/dL — AB (ref 11.1–15.9)
Hematocrit: 30.7 % — ABNORMAL LOW (ref 34.0–46.6)
MCH: 29.1 pg (ref 26.6–33.0)
MCHC: 33.2 g/dL (ref 31.5–35.7)
MCV: 88 fL (ref 79–97)
Platelets: 332 10*3/uL (ref 150–379)
RBC: 3.5 x10E6/uL — ABNORMAL LOW (ref 3.77–5.28)
RDW: 13.8 % (ref 12.3–15.4)
WBC: 10.6 10*3/uL (ref 3.4–10.8)

## 2017-10-02 LAB — RPR: RPR Ser Ql: NONREACTIVE

## 2017-10-04 ENCOUNTER — Other Ambulatory Visit: Payer: Self-pay | Admitting: Women's Health

## 2017-10-04 MED ORDER — FERROUS SULFATE 325 (65 FE) MG PO TABS: 325.0000 mg | ORAL_TABLET | Freq: Two times a day (BID) | ORAL | 3 refills | Status: AC

## 2017-10-08 ENCOUNTER — Encounter: Payer: Medicaid Other | Admitting: Women's Health

## 2017-10-09 ENCOUNTER — Telehealth: Payer: Self-pay | Admitting: *Deleted

## 2017-10-09 NOTE — Telephone Encounter (Signed)
LMOVM that she is anemic and prescription for iron was sent to pharmacy. Advised to take along with PNV and increase diet in iron rich foods like red meat, green leafy veggies. Advised to call back if she had further questions.

## 2017-10-12 ENCOUNTER — Encounter: Payer: Self-pay | Admitting: Obstetrics & Gynecology

## 2017-10-12 ENCOUNTER — Ambulatory Visit (INDEPENDENT_AMBULATORY_CARE_PROVIDER_SITE_OTHER): Payer: Medicaid Other | Admitting: Obstetrics & Gynecology

## 2017-10-12 VITALS — BP 130/80 | HR 95 | Wt 184.0 lb

## 2017-10-12 DIAGNOSIS — Z3483 Encounter for supervision of other normal pregnancy, third trimester: Secondary | ICD-10-CM

## 2017-10-12 DIAGNOSIS — Z1389 Encounter for screening for other disorder: Secondary | ICD-10-CM

## 2017-10-12 DIAGNOSIS — Z3A36 36 weeks gestation of pregnancy: Secondary | ICD-10-CM

## 2017-10-12 DIAGNOSIS — Z331 Pregnant state, incidental: Secondary | ICD-10-CM

## 2017-10-12 LAB — POCT URINALYSIS DIPSTICK
GLUCOSE UA: NEGATIVE
KETONES UA: NEGATIVE
Leukocytes, UA: NEGATIVE
Nitrite, UA: NEGATIVE
Protein, UA: NEGATIVE
RBC UA: NEGATIVE

## 2017-10-12 NOTE — Progress Notes (Signed)
[id number] 728w2d Estimated Date of Delivery: 11/07/17  Blood pressure 130/80, pulse 95, weight 184 lb (83.5 kg), last menstrual period 01/16/2017, currently breastfeeding.   BP weight and urine results all reviewed and noted.  Please refer to the obstetrical flow sheet for the fundal height and fetal heart rate documentation:  Patient reports good fetal movement, denies any bleeding and no rupture of membranes symptoms or regular contractions. Patient is without complaints. All questions were answered.  Orders Placed This Encounter  Procedures  . POCT urinalysis dipstick    Plan:  Continued routine obstetrical care,  GBS next week  Return in about 1 week (around 10/19/2017) for LROB  GBS.

## 2017-10-19 ENCOUNTER — Other Ambulatory Visit: Payer: Self-pay

## 2017-10-19 ENCOUNTER — Ambulatory Visit (INDEPENDENT_AMBULATORY_CARE_PROVIDER_SITE_OTHER): Payer: Medicaid Other | Admitting: Women's Health

## 2017-10-19 ENCOUNTER — Encounter: Payer: Self-pay | Admitting: Women's Health

## 2017-10-19 VITALS — BP 120/74 | HR 77 | Wt 185.0 lb

## 2017-10-19 DIAGNOSIS — O26899 Other specified pregnancy related conditions, unspecified trimester: Secondary | ICD-10-CM

## 2017-10-19 DIAGNOSIS — O360130 Maternal care for anti-D [Rh] antibodies, third trimester, not applicable or unspecified: Secondary | ICD-10-CM | POA: Diagnosis not present

## 2017-10-19 DIAGNOSIS — O99323 Drug use complicating pregnancy, third trimester: Secondary | ICD-10-CM | POA: Diagnosis not present

## 2017-10-19 DIAGNOSIS — Z1389 Encounter for screening for other disorder: Secondary | ICD-10-CM

## 2017-10-19 DIAGNOSIS — Z3A37 37 weeks gestation of pregnancy: Secondary | ICD-10-CM

## 2017-10-19 DIAGNOSIS — F1120 Opioid dependence, uncomplicated: Secondary | ICD-10-CM

## 2017-10-19 DIAGNOSIS — O99320 Drug use complicating pregnancy, unspecified trimester: Secondary | ICD-10-CM

## 2017-10-19 DIAGNOSIS — Z3483 Encounter for supervision of other normal pregnancy, third trimester: Secondary | ICD-10-CM

## 2017-10-19 DIAGNOSIS — Z6791 Unspecified blood type, Rh negative: Secondary | ICD-10-CM

## 2017-10-19 DIAGNOSIS — Z331 Pregnant state, incidental: Secondary | ICD-10-CM

## 2017-10-19 LAB — POCT URINALYSIS DIPSTICK
Glucose, UA: NEGATIVE
KETONES UA: NEGATIVE
LEUKOCYTES UA: NEGATIVE
NITRITE UA: NEGATIVE
PROTEIN UA: NEGATIVE
RBC UA: NEGATIVE

## 2017-10-19 NOTE — Addendum Note (Signed)
Addended by: Tish FredericksonLANCASTER, Allura Doepke A on: 10/19/2017 09:46 AM   Modules accepted: Orders

## 2017-10-19 NOTE — Patient Instructions (Signed)
[employer], I greatly value your feedback.  If you receive a survey following your visit with [place] today, we appreciate you taking the time to fill it out.  Thanks, Joellyn Haff, CNM, WHNP-BC   Call the office [phone]) or go to [facility] if:  You begin to have strong, frequent contractions  Your water breaks.  Sometimes it is a big gush of fluid, sometimes it is just a trickle that keeps getting your panties wet or running down your legs  You have vaginal bleeding.  It is normal to have a small amount of spotting if your cervix was checked.   You don't feel your baby moving like normal.  If you don't, get you something to eat and drink and lay down and focus on feeling your baby move.  You should feel at least 10 movements in 2 hours.  If you don't, you should call the office or go to [facility].    Tdap Vaccine  It is recommended that you get the Tdap vaccine during the third trimester of EACH pregnancy to help protect your baby from getting pertussis (whooping cough)  27-36 weeks is the BEST time to do this so that you can pass the protection on to your baby. During pregnancy is better than after pregnancy, but if you are unable to get it during pregnancy it will be offered at the hospital.   You can get this vaccine at the health department or your family doctor  Everyone who will be around your baby should also be up-to-date on their vaccines. Adults (who are not pregnant) only need 1 dose of Tdap during adulthood.     Braxton Hicks Contractions Contractions of the uterus can occur throughout pregnancy, but they are not always a sign that you are in labor. You may have practice contractions called Braxton Hicks contractions. These false labor contractions are sometimes confused with true labor. What are Deberah Pelton contractions? Braxton Hicks contractions are tightening movements that occur in the muscles of the uterus before labor. Unlike true labor contractions,  these contractions do not result in opening (dilation) and thinning of the cervix. Toward the end of pregnancy (32-34 weeks), Braxton Hicks contractions can happen more often and may become stronger. These contractions are sometimes difficult to tell apart from true labor because they can be very uncomfortable. You should not feel embarrassed if you go to the hospital with false labor. Sometimes, the only way to tell if you are in true labor is for your health care provider to look for changes in the cervix. The health care provider will do a physical exam and may monitor your contractions. If you are not in true labor, the exam should show that your cervix is not dilating and your water has not broken. If there are other health problems associated with your pregnancy, it is completely safe for you to be sent home with false labor. You may continue to have Braxton Hicks contractions until you go into true labor. How to tell the difference between true labor and false labor True labor  Contractions last 30-70 seconds.  Contractions become very regular.  Discomfort is usually felt in the top of the uterus, and it spreads to the lower abdomen and low back.  Contractions do not go away with walking.  Contractions usually become more intense and increase in frequency.  The cervix dilates and gets thinner. False labor  Contractions are usually shorter and not as strong as true labor contractions.  Contractions are usually irregular.  Contractions are often felt in the front of the lower abdomen and in the groin.  Contractions may go away when you walk around or change positions while lying down.  Contractions get weaker and are shorter-lasting as time goes on.  The cervix usually does not dilate or become thin. Follow these instructions at home:  Take over-the-counter and prescription medicines only as told by your health care provider.  Keep up with your usual exercises and follow other  instructions from your health care provider.  Eat and drink lightly if you think you are going into labor.  If Braxton Hicks contractions are making you uncomfortable: ? Change your position from lying down or resting to walking, or change from walking to resting. ? Sit and rest in a tub of warm water. ? Drink enough fluid to keep your urine pale yellow. Dehydration may cause these contractions. ? Do slow and deep breathing several times an hour.  Keep all follow-up prenatal visits as told by your health care provider. This is important. Contact a health care provider if:  You have a fever.  You have continuous pain in your abdomen. Get help right away if:  Your contractions become stronger, more regular, and closer together.  You have fluid leaking or gushing from your vagina.  You pass blood-tinged mucus (bloody show).  You have bleeding from your vagina.  You have low back pain that you never had before.  You feel your baby's head pushing down and causing pelvic pressure.  Your baby is not moving inside you as much as it used to. Summary  Contractions that occur before labor are called Braxton Hicks contractions, false labor, or practice contractions.  Braxton Hicks contractions are usually shorter, weaker, farther apart, and less regular than true labor contractions. True labor contractions usually become progressively stronger and regular and they become more frequent.  Manage discomfort from [facility] contractions by changing position, resting in a warm bath, drinking plenty of water, or practicing deep breathing. This information is not intended to replace advice given to you by your health care provider. Make sure you discuss any questions you have with your health care provider. Document Released: 12/21/2016 Document Revised: 12/21/2016 Document Reviewed: 12/21/2016 Elsevier Interactive Patient Education  2018 [employer]

## 2017-10-19 NOTE — Progress Notes (Signed)
   LOW-RISK PREGNANCY VISIT Patient name: Denise Sandoval MRN [id number]  Date of birth: April 09, 1996 Chief Complaint:   Routine Prenatal Visit (gbs/gc)  History of Present Illness:   Denise Sandoval is a 22 y.o. 533P2002 female at 3357w2d with an Estimated Date of Delivery: 11/07/17 being seen today for ongoing management of a low-risk pregnancy.  Today she reports no complaints. On Subutex 8mg  daily from Dr. Cathey EndowBowen. Declines NICU/NAS tour. Got Rhogam in 1st trimester for vb, has not had since.  Contractions: Not present. Vag. Bleeding: None.  Movement: Present. denies leaking of fluid. Review of Systems:   Pertinent items are noted in HPI Denies abnormal vaginal discharge w/ itching/odor/irritation, headaches, visual changes, shortness of breath, chest pain, abdominal pain, severe nausea/vomiting, or problems with urination or bowel movements unless otherwise stated above. Pertinent History Reviewed:  Reviewed past medical,surgical, social, obstetrical and family history.  Reviewed problem list, medications and allergies. Physical Assessment:   Vitals:   10/19/17 0913  BP: 120/74  Pulse: 77  Weight: 185 lb (83.9 kg)  Body mass index is 28.13 kg/m.        Physical Examination:   General appearance: Well appearing, and in no distress  Mental status: Alert, oriented to person, place, and time  Skin: Warm & dry  Cardiovascular: Normal heart rate noted  Respiratory: Normal respiratory effort, no distress  Abdomen: Soft, gravid, nontender  Pelvic: Cervical exam performed  Dilation: 3 Effacement (%): 60 Station: -2  Extremities: Edema: Trace  Fetal Status: Fetal Heart Rate (bpm): 154 Fundal Height: 36 cm Movement: Present Presentation: Vertex  Results for orders placed or performed in visit on 10/19/17 (from the past 24 hour(s))  POCT urinalysis dipstick   Collection Time: 10/19/17  9:15 AM  Result Value Ref Range   Color, UA     Clarity, UA     Glucose, UA neg    Bilirubin, UA     Ketones, UA neg    Spec Grav, UA  1.010 - 1.025   Blood, UA neg    pH, UA  5.0 - 8.0   Protein, UA neg    Urobilinogen, UA  0.2 or 1.0 E.U./dL   Nitrite, UA neg    Leukocytes, UA Negative Negative   Appearance     Odor      Assessment & Plan:  1) Low-risk pregnancy G3P2002 at 6157w2d with an Estimated Date of Delivery: 11/07/17   2) Subutex, 8mg  daily, declines NAS/NICU tour  3) Rh neg> Rhogam given today   Meds: No orders of the defined types were placed in this encounter.  Labs/procedures today: gbs, gc/ct, sve, rhogam  Plan:  Continue routine obstetrical care   Reviewed: Term labor symptoms and general obstetric precautions including but not limited to vaginal bleeding, contractions, leaking of fluid and fetal movement were reviewed in detail with the patient.  All questions were answered  Follow-up: Return in about 1 week (around 10/26/2017) for LROB, please order Nexplanon today.  Orders Placed This Encounter  Procedures  . GC/Chlamydia Probe Amp  . Culture, beta strep (group b only)  . POCT urinalysis dipstick   Cheral MarkerKimberly R Zayan Delvecchio CNM, [facility]NP-BC 10/19/2017 9:40 AM

## 2017-10-22 LAB — GC/CHLAMYDIA PROBE AMP
CHLAMYDIA, DNA PROBE: NEGATIVE
NEISSERIA GONORRHOEAE BY PCR: NEGATIVE

## 2017-10-22 LAB — CULTURE, BETA STREP (GROUP B ONLY): STREP GP B CULTURE: POSITIVE — AB

## 2017-10-25 ENCOUNTER — Inpatient Hospital Stay (HOSPITAL_COMMUNITY): Payer: Medicaid Other | Admitting: Anesthesiology

## 2017-10-25 ENCOUNTER — Encounter: Payer: Medicaid Other | Admitting: Advanced Practice Midwife

## 2017-10-25 ENCOUNTER — Encounter (HOSPITAL_COMMUNITY): Payer: Self-pay | Admitting: *Deleted

## 2017-10-25 ENCOUNTER — Inpatient Hospital Stay (HOSPITAL_COMMUNITY)
Admission: AD | Admit: 2017-10-25 | Discharge: 2017-10-27 | DRG: 807 | Disposition: A | Discharge status: Home / Self Care | Payer: Medicaid Other | Source: Ambulatory Visit | Attending: Obstetrics & Gynecology | Admitting: Obstetrics & Gynecology

## 2017-10-25 DIAGNOSIS — Z3A38 38 weeks gestation of pregnancy: Secondary | ICD-10-CM | POA: Diagnosis not present

## 2017-10-25 DIAGNOSIS — O479 False labor, unspecified: Secondary | ICD-10-CM | POA: Diagnosis present

## 2017-10-25 DIAGNOSIS — F1290 Cannabis use, unspecified, uncomplicated: Secondary | ICD-10-CM | POA: Diagnosis present

## 2017-10-25 DIAGNOSIS — O99824 Streptococcus B carrier state complicating childbirth: Secondary | ICD-10-CM | POA: Diagnosis present

## 2017-10-25 DIAGNOSIS — O9902 Anemia complicating childbirth: Secondary | ICD-10-CM | POA: Diagnosis present

## 2017-10-25 DIAGNOSIS — F1120 Opioid dependence, uncomplicated: Secondary | ICD-10-CM

## 2017-10-25 DIAGNOSIS — O26893 Other specified pregnancy related conditions, third trimester: Secondary | ICD-10-CM | POA: Diagnosis present

## 2017-10-25 DIAGNOSIS — O468X9 Other antepartum hemorrhage, unspecified trimester: Secondary | ICD-10-CM

## 2017-10-25 DIAGNOSIS — O99324 Drug use complicating childbirth: Secondary | ICD-10-CM | POA: Diagnosis present

## 2017-10-25 DIAGNOSIS — Z283 Underimmunization status: Secondary | ICD-10-CM

## 2017-10-25 DIAGNOSIS — Z3483 Encounter for supervision of other normal pregnancy, third trimester: Secondary | ICD-10-CM | POA: Diagnosis present

## 2017-10-25 DIAGNOSIS — O9989 Other specified diseases and conditions complicating pregnancy, childbirth and the puerperium: Secondary | ICD-10-CM

## 2017-10-25 DIAGNOSIS — Z6791 Unspecified blood type, Rh negative: Secondary | ICD-10-CM

## 2017-10-25 DIAGNOSIS — D649 Anemia, unspecified: Secondary | ICD-10-CM | POA: Diagnosis present

## 2017-10-25 DIAGNOSIS — O26899 Other specified pregnancy related conditions, unspecified trimester: Secondary | ICD-10-CM

## 2017-10-25 DIAGNOSIS — Z87891 Personal history of nicotine dependence: Secondary | ICD-10-CM | POA: Diagnosis not present

## 2017-10-25 DIAGNOSIS — O09899 Supervision of other high risk pregnancies, unspecified trimester: Secondary | ICD-10-CM

## 2017-10-25 DIAGNOSIS — Z2839 Other underimmunization status: Secondary | ICD-10-CM

## 2017-10-25 DIAGNOSIS — O418X90 Other specified disorders of amniotic fluid and membranes, unspecified trimester, not applicable or unspecified: Secondary | ICD-10-CM

## 2017-10-25 DIAGNOSIS — O99320 Drug use complicating pregnancy, unspecified trimester: Secondary | ICD-10-CM

## 2017-10-25 DIAGNOSIS — Z3490 Encounter for supervision of normal pregnancy, unspecified, unspecified trimester: Secondary | ICD-10-CM

## 2017-10-25 LAB — CBC
HCT: 33 % — ABNORMAL LOW (ref 36.0–46.0)
HEMOGLOBIN: 11.2 g/dL — AB (ref 12.0–15.0)
MCH: 28 pg (ref 26.0–34.0)
MCHC: 33.9 g/dL (ref 30.0–36.0)
MCV: 82.5 fL (ref 78.0–100.0)
Platelets: 365 10*3/uL (ref 150–400)
RBC: 4 MIL/uL (ref 3.87–5.11)
RDW: 13.8 % (ref 11.5–15.5)
WBC: 11.2 10*3/uL — AB (ref 4.0–10.5)

## 2017-10-25 LAB — HIV ANTIBODY (ROUTINE TESTING W REFLEX): HIV Screen 4th Generation wRfx: NONREACTIVE

## 2017-10-25 LAB — RPR: RPR Ser Ql: NONREACTIVE

## 2017-10-25 MED ORDER — EPHEDRINE 5 MG/ML INJ
10.0000 mg | INTRAVENOUS | Status: DC | PRN
  Filled 2017-10-25: qty 2

## 2017-10-25 MED ORDER — SODIUM CHLORIDE 0.9 % IV SOLN
2.0000 g | Freq: Once | INTRAVENOUS | Status: AC
  Administered 2017-10-25: 2 g via INTRAVENOUS
  Filled 2017-10-25: qty 2

## 2017-10-25 MED ORDER — PRENATAL MULTIVITAMIN CH
1.0000 | ORAL_TABLET | Freq: Every day | ORAL | Status: DC
  Administered 2017-10-25 – 2017-10-27 (×3): 1 via ORAL
  Filled 2017-10-25 (×3): qty 1

## 2017-10-25 MED ORDER — OXYCODONE-ACETAMINOPHEN 5-325 MG PO TABS: 1.0000 | ORAL_TABLET | ORAL | Status: DC | PRN

## 2017-10-25 MED ORDER — BUPRENORPHINE HCL 8 MG SL SUBL
8.0000 mg | SUBLINGUAL_TABLET | Freq: Every day | SUBLINGUAL | Status: DC
  Administered 2017-10-25 – 2017-10-27 (×3): 8 mg via SUBLINGUAL
  Filled 2017-10-25 (×3): qty 1

## 2017-10-25 MED ORDER — SENNOSIDES-DOCUSATE SODIUM 8.6-50 MG PO TABS
2.0000 | ORAL_TABLET | ORAL | Status: DC
  Administered 2017-10-26 (×2): 2 via ORAL
  Filled 2017-10-25 (×2): qty 2

## 2017-10-25 MED ORDER — PHENYLEPHRINE 40 MCG/ML (10ML) SYRINGE FOR IV PUSH (FOR BLOOD PRESSURE SUPPORT)
80.0000 ug | PREFILLED_SYRINGE | INTRAVENOUS | Status: DC | PRN
  Filled 2017-10-25: qty 5

## 2017-10-25 MED ORDER — SOD CITRATE-CITRIC ACID 500-334 MG/5ML PO SOLN: 30.0000 mL | ORAL | Status: DC | PRN

## 2017-10-25 MED ORDER — OXYCODONE-ACETAMINOPHEN 5-325 MG PO TABS: 2.0000 | ORAL_TABLET | ORAL | Status: DC | PRN

## 2017-10-25 MED ORDER — ONDANSETRON HCL 4 MG/2ML IJ SOLN
4.0000 mg | Freq: Four times a day (QID) | INTRAMUSCULAR | Status: DC | PRN
  Administered 2017-10-25: 4 mg via INTRAVENOUS
  Filled 2017-10-25: qty 2

## 2017-10-25 MED ORDER — ONDANSETRON HCL 4 MG/2ML IJ SOLN: 4.0000 mg | INTRAMUSCULAR | Status: DC | PRN

## 2017-10-25 MED ORDER — COCONUT OIL OIL: 1.0000 "application " | TOPICAL_OIL | Status: DC | PRN

## 2017-10-25 MED ORDER — FENTANYL CITRATE (PF) 100 MCG/2ML IJ SOLN
100.0000 ug | Freq: Once | INTRAMUSCULAR | Status: AC
  Administered 2017-10-25: 100 ug via INTRAVENOUS
  Filled 2017-10-25: qty 2

## 2017-10-25 MED ORDER — LIDOCAINE HCL (PF) 1 % IJ SOLN
INTRAMUSCULAR | Status: DC | PRN
  Administered 2017-10-25: 4 mL via EPIDURAL
  Administered 2017-10-25: 8 mL via EPIDURAL

## 2017-10-25 MED ORDER — LACTATED RINGERS IV SOLN: INTRAVENOUS | Status: DC

## 2017-10-25 MED ORDER — OXYTOCIN BOLUS FROM INFUSION
500.0000 mL | Freq: Once | INTRAVENOUS | Status: AC
  Administered 2017-10-25: 500 mL via INTRAVENOUS

## 2017-10-25 MED ORDER — IBUPROFEN 600 MG PO TABS
600.0000 mg | ORAL_TABLET | Freq: Four times a day (QID) | ORAL | Status: DC
  Administered 2017-10-25 – 2017-10-27 (×9): 600 mg via ORAL
  Filled 2017-10-25 (×9): qty 1

## 2017-10-25 MED ORDER — RHO D IMMUNE GLOBULIN 1500 UNIT/2ML IJ SOSY
300.0000 ug | PREFILLED_SYRINGE | Freq: Once | INTRAMUSCULAR | Status: AC
  Administered 2017-10-25: 300 ug via INTRAVENOUS
  Filled 2017-10-25: qty 2

## 2017-10-25 MED ORDER — BENZOCAINE-MENTHOL 20-0.5 % EX AERO
1.0000 "application " | INHALATION_SPRAY | CUTANEOUS | Status: DC | PRN
  Administered 2017-10-26: 1 via TOPICAL
  Filled 2017-10-25: qty 56

## 2017-10-25 MED ORDER — PHENYLEPHRINE 40 MCG/ML (10ML) SYRINGE FOR IV PUSH (FOR BLOOD PRESSURE SUPPORT)
80.0000 ug | PREFILLED_SYRINGE | INTRAVENOUS | Status: DC | PRN
  Filled 2017-10-25: qty 10
  Filled 2017-10-25: qty 5

## 2017-10-25 MED ORDER — OXYTOCIN 40 UNITS IN LACTATED RINGERS INFUSION - SIMPLE MED
2.5000 [IU]/h | INTRAVENOUS | Status: DC
  Administered 2017-10-25: 2.5 [IU]/h via INTRAVENOUS
  Filled 2017-10-25: qty 1000

## 2017-10-25 MED ORDER — DIPHENHYDRAMINE HCL 25 MG PO CAPS: 25.0000 mg | ORAL_CAPSULE | Freq: Four times a day (QID) | ORAL | Status: DC | PRN

## 2017-10-25 MED ORDER — LIDOCAINE HCL (PF) 1 % IJ SOLN
30.0000 mL | INTRAMUSCULAR | Status: DC | PRN
  Filled 2017-10-25: qty 30

## 2017-10-25 MED ORDER — ACETAMINOPHEN 325 MG PO TABS
650.0000 mg | ORAL_TABLET | ORAL | Status: DC | PRN
  Administered 2017-10-25: 650 mg via ORAL
  Filled 2017-10-25: qty 2

## 2017-10-25 MED ORDER — EPHEDRINE 5 MG/ML INJ
10.0000 mg | INTRAVENOUS | Status: DC | PRN
Start: 2017-10-25 — End: 2017-10-25
  Filled 2017-10-25: qty 2

## 2017-10-25 MED ORDER — LACTATED RINGERS IV SOLN: 500.0000 mL | INTRAVENOUS | Status: DC | PRN

## 2017-10-25 MED ORDER — FENTANYL 2.5 MCG/ML BUPIVACAINE 1/10 % EPIDURAL INFUSION (WH - ANES)
14.0000 mL/h | INTRAMUSCULAR | Status: DC | PRN
  Administered 2017-10-25: 14 mL/h via EPIDURAL
  Filled 2017-10-25: qty 100

## 2017-10-25 MED ORDER — LACTATED RINGERS IV SOLN
500.0000 mL | Freq: Once | INTRAVENOUS | Status: AC
  Administered 2017-10-25: 1000 mL via INTRAVENOUS

## 2017-10-25 MED ORDER — ACETAMINOPHEN 325 MG PO TABS: 650.0000 mg | ORAL_TABLET | ORAL | Status: DC | PRN

## 2017-10-25 MED ORDER — SIMETHICONE 80 MG PO CHEW: 80.0000 mg | CHEWABLE_TABLET | ORAL | Status: DC | PRN

## 2017-10-25 MED ORDER — WITCH HAZEL-GLYCERIN EX PADS: 1.0000 "application " | MEDICATED_PAD | CUTANEOUS | Status: DC | PRN

## 2017-10-25 MED ORDER — DIPHENHYDRAMINE HCL 50 MG/ML IJ SOLN: 12.5000 mg | INTRAMUSCULAR | Status: DC | PRN

## 2017-10-25 MED ORDER — ZOLPIDEM TARTRATE 5 MG PO TABS: 5.0000 mg | ORAL_TABLET | Freq: Every evening | ORAL | Status: DC | PRN

## 2017-10-25 MED ORDER — DIBUCAINE 1 % RE OINT: 1.0000 "application " | TOPICAL_OINTMENT | RECTAL | Status: DC | PRN

## 2017-10-25 MED ORDER — TETANUS-DIPHTH-ACELL PERTUSSIS 5-2.5-18.5 LF-MCG/0.5 IM SUSP: 0.5000 mL | Freq: Once | INTRAMUSCULAR | Status: DC

## 2017-10-25 MED ORDER — ONDANSETRON HCL 4 MG PO TABS
4.0000 mg | ORAL_TABLET | ORAL | Status: DC | PRN
  Filled 2017-10-25: qty 1

## 2017-10-25 NOTE — Progress Notes (Signed)
Rhogam is ready for pickup. Night shift RN aware. Royston CowperIsley, Genavie Boettger E, RN

## 2017-10-25 NOTE — Anesthesia Postprocedure Evaluation (Signed)
Anesthesia Post Note  Patient: [name]  Procedure(s) Performed: AN AD HOC LABOR EPIDURAL     Patient location during evaluation: Mother Baby Anesthesia Type: Epidural Level of consciousness: awake Pain management: satisfactory to patient Vital Signs Assessment: post-procedure vital signs reviewed and stable Respiratory status: spontaneous breathing Cardiovascular status: stable Anesthetic complications: no    Last Vitals:  Vitals:   10/25/17 1015 10/25/17 1115  BP: 128/78 122/66  Pulse: (!) 55 77  Resp: 18 18  Temp: 37.1 C 36.9 C  SpO2: 100% 99%    Last Pain:  Vitals:   10/25/17 1115  TempSrc: Oral  PainSc: 2    Pain Goal: Patients Stated Pain Goal: 9 (10/25/17 [id number])               Cephus ShellingBURGER,Myles Mallicoat

## 2017-10-25 NOTE — Anesthesia Procedure Notes (Signed)
Epidural Patient location during procedure: OB Start time: 10/25/2017 7:54 AM End time: 10/25/2017 7:58 AM  Staffing Anesthesiologist: Beryle LatheBrock, Vannak Montenegro E, MD Performed: anesthesiologist   Preanesthetic Checklist Completed: patient identified, pre-op evaluation, timeout performed, IV checked, risks and benefits discussed and monitors and equipment checked  Epidural Patient position: sitting Prep: DuraPrep Patient monitoring: continuous pulse ox and blood pressure Approach: midline Location: L2-L3 Injection technique: LOR saline  Needle:  Needle type: Tuohy  Needle gauge: 17 G Needle length: 9 cm Needle insertion depth: 5 cm Catheter size: 19 Gauge Catheter at skin depth: 10 cm Test dose: negative and Other (1% lidocaine)  Additional Notes Patient identified. Risks including, but not limited to, bleeding, infection, nerve damage, paralysis, inadequate analgesia, blood pressure changes, nausea, vomiting, allergic reaction, postpartum back pain, itching, and headache were discussed. Patient expressed understanding and wished to proceed. Sterile prep and drape, including hand hygiene, mask, and sterile gloves were used. The patient was positioned and the spine was prepped. The skin was anesthetized with lidocaine. No paraesthesia or other complication noted. The patient did not experience any signs of intravascular injection such as tinnitus or metallic taste in mouth, nor signs of intrathecal spread such as rapid motor block. Please see nursing notes for vital signs. The patient tolerated the procedure well.   Denise Peerhomas Emmitt Sandoval, MDReason for block:procedure for pain

## 2017-10-25 NOTE — H&P (Signed)
Denise CrazierKaitlyn Sandoval is a 22 y.o. female presenting for leaking fluid and contractions since 2300.   Exam was negative for ruptured membranes but patient's cervix is dilated 7cm..  RN Note: Pt presents to MAU c/o SROM @2300  10/24/17 clear fluid. Pt reports +fm and ctxs every 4min.  [facility]. Patient Active Problem List   Diagnosis Date Noted  . Uterine contractions during pregnancy 10/25/2017  . Pregnancy complicated by subutex maintenance, antepartum (HCC) 10/19/2017  . Subchorionic hematoma, antepartum 05/09/2017  . UTI (urinary tract infection) during pregnancy, first trimester 04/25/2017  . Post partum depression 04/11/2017  . Supervision of normal pregnancy 04/11/2017  . Seizure (HCC) 07/14/2016  . History of chlamydia 04/17/2016  . Short interval between pregnancies affecting pregnancy, antepartum 04/17/2016  . MDD (major depressive disorder), recurrent severe, without psychosis (HCC) 04/30/2015  . Alcohol use disorder, moderate, dependence (HCC) 04/30/2015  . Opioid use disorder, moderate, dependence (HCC) 04/30/2015  . Moderate benzodiazepine use disorder (HCC) 04/30/2015  . Major depressive disorder 04/25/2015  . Post traumatic stress disorder (PTSD) 01/16/2015  . Marijuana use 01/14/2014  . Rubella non-immune status, antepartum 01/14/2014  . Rh negative state in antepartum period 01/14/2014    OB History    Gravida Para Term Preterm AB Living   3 2 2     2    SAB TAB Ectopic Multiple Live Births         0 2     Past Medical History:  Diagnosis Date  . Anxiety   . Depression   . Hypothyroidism   . Kidney infection   . Opiate use   . Postpartum endometritis 05/13/2014  . RLQ abdominal pain 05/13/2014  . Seizures (HCC)   . Supervision of normal first pregnancy in first trimester 09/23/2013   Past Surgical History:  Procedure Laterality Date  . WISDOM TOOTH EXTRACTION     Family History: family history includes Alcohol abuse  in her father, maternal grandfather, mother, and paternal grandfather; Cancer - Lung in her maternal aunt; Hypertension in her maternal grandmother. Social History:  reports that she quit smoking about 19 months ago. Her smoking use included cigarettes. She has a 0.75 pack-year smoking history. she has never used smokeless tobacco. She reports that she does not drink alcohol or use drugs.     Maternal Diabetes: No Genetic Screening: Normal Maternal Ultrasounds/Referrals: Normal Fetal Ultrasounds or other Referrals:  None Maternal Substance Abuse:  Yes:  Type: Other:  Prior use of multiple substances,now on Subutex 8mg  Significant Maternal Medications:  Meds include: Other:  Significant Maternal Lab Results:  Lab values include: Group B Strep positive Other Comments:  Declined NICU tour, admitted advanced labor  Review of Systems  Constitutional: Negative for chills and fever.  Eyes: Negative for blurred vision.  Respiratory: Negative for shortness of breath.   Gastrointestinal: Positive for abdominal pain. Negative for constipation, diarrhea, nausea and vomiting.  Genitourinary: Negative for dysuria.  Neurological: Negative for dizziness.   Maternal Medical History:  Reason for admission: Contractions.  Nausea.  Contractions: Onset was 6-12 hours ago.   Frequency: regular.   Perceived severity is strong.    Fetal activity: Perceived fetal activity is normal.   Last perceived fetal movement was within the past hour.    Prenatal complications: Substance abuse.   No pre-eclampsia or preterm labor.   Prenatal Complications - Diabetes: none.      Blood pressure (!) 141/78, temperature 97.7 F (36.5 C), temperature source  Oral, resp. rate 20, last menstrual period 01/16/2017, currently breastfeeding. Maternal Exam:  Uterine Assessment: Contraction strength is firm.  Contraction frequency is regular.   Abdomen: Patient reports no abdominal tenderness. Fetal presentation:  vertex  Introitus: Normal vulva. Normal vagina.  Pelvis: adequate for delivery.   Cervix: Cervix evaluated by digital exam.     Fetal Exam Fetal Monitor Review: Mode: ultrasound.   Baseline rate: 140.  Variability: moderate (6-25 bpm).   Pattern: accelerations present and no decelerations.    Fetal State Assessment: Category I - tracings are normal.     Physical Exam  Constitutional: She is oriented to person, place, and time. She appears well-developed and well-nourished. No distress.  HENT:  Head: Normocephalic.  Cardiovascular: Normal rate and regular rhythm.  Respiratory: Effort normal. No respiratory distress. She has no wheezes. She has no rales.  GI: Soft. She exhibits no distension. There is no tenderness. There is no rebound and no guarding.  Genitourinary:  Genitourinary Comments: Dilation: 6 Effacement (%): 90 Station: 0 Presentation: Vertex Exam by:: Lahoma Crocker RN    Musculoskeletal: Normal range of motion.  Neurological: She is alert and oriented to person, place, and time.  Skin: Skin is warm and dry.  Psychiatric: She has a normal mood and affect.    Prenatal labs: ABO, Rh:   Antibody: Negative (02/11 0909) Rubella:   RPR: Non Reactive (02/11 0909)  HBsAg: Negative (08/22 1814)  HIV: Non Reactive (02/11 0909)  GBS:     Assessment/Plan: Single IUP at [redacted]w[redacted]d Active Labor GBS + Subutex patient Hx Subchorionic hemorrhage  Admit to [facility] suites Per Dr Despina Hidden, pt may have one dose of Fentanyl prior to Epidural Routine orders Ampicillin prophylaxis Anticipate SVD   Wynelle Bourgeois 10/25/2017, 6:23 AM

## 2017-10-25 NOTE — Anesthesia Preprocedure Evaluation (Signed)
Anesthesia Evaluation  Patient identified by MRN, date of birth, ID band Patient awake    Reviewed: Allergy & Precautions, H&P , NPO status , Patient's Chart, lab work & pertinent test results  History of Anesthesia Complications Negative for: history of anesthetic complications  Airway Mallampati: II  TM Distance: >3 FB Neck ROM: Full    Dental no notable dental hx. (+) Teeth Intact   Pulmonary neg sleep apnea, neg COPD, neg recent URI, former smoker,    Pulmonary exam normal breath sounds clear to auscultation       Cardiovascular negative cardio ROS Normal cardiovascular exam Rhythm:Regular Rate:Normal     Neuro/Psych Seizures -,  Anxiety Depression    GI/Hepatic negative GI ROS, Neg liver ROS,   Endo/Other  Hypothyroidism   Renal/GU Recurrent pyelonephritis during pregnancy     Musculoskeletal   Abdominal Normal abdominal exam  (+)   Peds  Hematology  (+) anemia ,   Anesthesia Other Findings   Reproductive/Obstetrics (+) Pregnancy                             Anesthesia Physical  Anesthesia Plan  ASA: II  Anesthesia Plan: Epidural   Post-op Pain Management:    Induction:   PONV Risk Score and Plan:   Airway Management Planned: Natural Airway  Additional Equipment:   Intra-op Plan:   Post-operative Plan:   Informed Consent: I have reviewed the patients History and Physical, chart, labs and discussed the procedure including the risks, benefits and alternatives for the proposed anesthesia with the patient or authorized representative who has indicated his/her understanding and acceptance.     Plan Discussed with:   Anesthesia Plan Comments: (Labs reviewed. Platelets acceptable, patient not taking any blood thinning medications. Risks and benefits discussed with patient, patient expressed understanding and wished to proceed.)        Anesthesia Quick Evaluation

## 2017-10-26 ENCOUNTER — Telehealth: Payer: Self-pay | Admitting: *Deleted

## 2017-10-26 LAB — RH IG WORKUP (INCLUDES ABO/RH)
ABO/RH(D): O NEG
Fetal Screen: NEGATIVE
Gestational Age(Wks): 38.1
UNIT DIVISION: 0

## 2017-10-26 NOTE — Plan of Care (Signed)
Progressing appropriately.

## 2017-10-26 NOTE — Progress Notes (Signed)
Post Partum Day 1 Subjective: no complaints, up ad lib, voiding and tolerating PO, small lochia, plans to bottle feed, Nexplanon  Objective: Blood pressure (!) 103/55, pulse (!) 53, temperature 98.1 F (36.7 C), temperature source Oral, resp. rate 18, last menstrual period 01/16/2017, SpO2 100 %, unknown if currently breastfeeding.  Physical Exam:  General: alert, cooperative and no distress Lochia:normal flow Chest: CTAB Heart: RRR no m/r/g Abdomen: +BS, soft, nontender,  Uterine Fundus: firm DVT Evaluation: No evidence of DVT seen on physical exam. Extremities: trace edema  Recent Labs    10/25/17 0625  HGB 11.2*  HCT 33.0*    Assessment/Plan: Will go on rooming in status tomorrow d/t subutex   LOS: 1 day   Denise Sandoval 10/26/2017, 7:54 AM

## 2017-10-26 NOTE — Telephone Encounter (Signed)
Called pt to set up pp appointment but had to leave voicemail.  10-26-17  AS

## 2017-10-27 MED ORDER — IBUPROFEN 600 MG PO TABS: 600.0000 mg | ORAL_TABLET | Freq: Four times a day (QID) | ORAL | 0 refills | Status: AC

## 2017-10-27 MED ORDER — MEASLES, MUMPS & RUBELLA VAC ~~LOC~~ INJ
0.5000 mL | INJECTION | Freq: Once | SUBCUTANEOUS | Status: DC
  Filled 2017-10-27 (×2): qty 0.5

## 2017-10-27 MED ORDER — BUPRENORPHINE HCL 8 MG SL SUBL: 8.0000 mg | SUBLINGUAL_TABLET | Freq: Every day | SUBLINGUAL | 0 refills | Status: AC

## 2017-10-27 NOTE — Clinical Social Work Maternal (Signed)
CLINICAL SOCIAL WORK MATERNAL/CHILD NOTE  Patient Details  Name: Denise Sandoval MRN: [id number] Date of Birth: Feb 22, 1996  Date:  10/27/2017  Clinical Social Worker Initiating Note:  Laurey Arrow Date/Time: Initiated:  10/27/17/1138     Child's Name:  Denise Sandoval   Biological Parents:  Mother, Father   Need for Interpreter:  None   Reason for Referral:  Current Substance Use/Substance Use During Pregnancy , Behavioral Health Concerns([name] currently taking Subutex and has a MH hx. (anx/dep).)   Address:  [place] [address]    Phone number:  [phone] (home)     Additional phone number:   Household Members/Support Persons (HM/SP):   Household Member/Support Person 1, Household Member/Support Person 2([name] and FOB resides with FOB's PGM(Pat Lyn). [name] also has a daughter  Weyman Pedro 05/07/14) that currently resides with [name]'s MGM.; CPS was not involved with placement per [name].)   HM/SP Name Relationship DOB or Age  HM/SP -[place]  FOB 09/25/1994  HM/SP -2 Valarie Merino [place] son 09/27/2016  HM/SP -3        HM/SP -4        HM/SP -5        HM/SP -6        HM/SP -7        HM/SP -8          Natural Supports (not living in the home):  Extended Family, Immediate Family, Parent   Professional Supports: Therapist([name] is an established patient with [facility]. (Dr. Jerilynn Mages. Bowen).)   Employment: Unemployed   Type of Work:     Education:  [place] arranged:    Museum/gallery curator Resources:  [employer]   Other Resources:  [employer], Physicist, medical    Cultural/Religious Considerations Which May Impact Care:  None Reported  Strengths:  Ability to meet basic needs , Psychotropic Medications, Home prepared for child , Understanding of illness, Compliance with medical plan    Psychotropic Medications:  Subutex      Pediatrician:       Pediatrician List:   [place]      Pediatrician Fax Number:    Risk Factors/Current Problems:  Substance Use , Mental Health Concerns    Cognitive State:  Able to Concentrate , Alert , Insightful , Goal Oriented , Linear Thinking    Mood/Affect:  Bright , Relaxed , Calm , Comfortable , Happy , Interested    CSW Assessment: CSW met with [name] in room 140 to complete an assessment for SA hx and MH hx.  CSW explained CSW's role and [name] gave CSW permission to complete the assessment while FOB was present. [name] and FOB were supportive of one another during the assessment and were easy to engage.  [name] was honest and they both were receptive to meeting with CSW.  CSW asked about [name]'s MH hx and [name] reported a dx of anx/dep around age 62.  [name] stated that [name] has tried numerous medications in the past but is not currently taking any medications.  [name] stated that currently [name] symptoms are being managed with weekly outpatient counseling and natural remedies such as walking.CSW provided education regarding the baby blues period vs. perinatal mood disorders, discussed treatment and gave resources for mental health follow up if concerns arise.  CSW recommends self-evaluation during the  postpartum time period using the New Mom Checklist from Postpartum Progress and encouraged [name] to contact a medical professional if symptoms are noted at any time.  [name] did not present with any acute MH symptoms and denied SI, HI, and DV.  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  [name] shared that during [name]'s 2nd trimester [name] moved out of state ([place]) and due to a lack of insurance coverage [name] was unable to obtain prescription for about 3 months.  [name] has since moved by to [facility] and has been consistent with medication. [name] admitted to the use of  marijuana throughout pregnancy to assist [name] with decreasing [name]'s nausea.  [name] reported [name]'s last use was 07/2017.  [name] informed [name] of hospital's SA policy and [name] was understanding.  [name] was forthcoming about [name]'s CPS hx and reported [name] recent CPS case closed November 2018; stemming from the report that CSW made February 2018. CSW made [name] aware that infant's UDS was negative and CSW will continue to monitor infant's CDS.  If infant's CDS is positive without an explanation, CSW will make a report to [facility] CPS.   CSW provided review of Sudden Infant Death Syndrome (SIDS) precautions.    [name] reported having all necessary items for infant and feeling prepared to parent.   CSW Plan/Description:  No Further Intervention Required/No Barriers to Discharge, Sudden Infant Death Syndrome (SIDS) Education, Neonatal Abstinence Syndrome (NAS) Education, Other Information/Referral to [employer], Perinatal Mood and Anxiety Disorder (PMADs) Education, Other Patient/Family Education, [place], CSW Will Continue to Monitor Umbilical Cord Tissue Drug Screen Results and Make Report if Warranted   Laurey Arrow, MSW, LCSW Clinical Social Work [phone]  Dimple Nanas, LCSW 10/27/2017, 11:45 AM

## 2017-10-27 NOTE — Discharge Summary (Signed)
OB Discharge Summary     Patient Name: Denise Sandoval DOB: 08-Oct-1995 MRN: [id number]  Date of admission: 10/25/2017 Delivering MD: Aviva SignsWILLIAMS, MARIE L   Date of discharge: 10/27/2017  Admitting diagnosis: 38 WEEKS CTX Intrauterine pregnancy: 3868w1d     Secondary diagnosis:  Principal Problem:   SVD (spontaneous vaginal delivery) Active Problems:   Marijuana use   Rubella non-immune status, antepartum   Rh negative state in antepartum period   Short interval between pregnancies affecting pregnancy, antepartum   Supervision of normal pregnancy   Pregnancy complicated by subutex maintenance, antepartum (HCC)   Uterine contractions during pregnancy  Additional problems: none     Discharge diagnosis: Term Pregnancy Delivered                                                                                                Post partum procedures:rhogam  Augmentation: none  Complications: None  Hospital course:  Onset of Labor With Vaginal Delivery     22 y.o. yo G3P3003 at 5968w1d was admitted in Active Labor on 10/25/2017. Patient had an uncomplicated labor course as follows:  Membrane Rupture Time/Date: 11:00 PM ,10/24/2017   Intrapartum Procedures: Episiotomy: None [1]                                         Lacerations:  None [1]  Patient had a delivery of a Viable infant. 10/25/2017  Information for the patient's newborn:  Georga BoraRosier, Boy Lamis [[id number]][[id number]]  Delivery Method: Vaginal, Spontaneous(Filed from Delivery Summary)   Pateint had an uncomplicated postpartum course. She received rhogam prior to discharge. She is ambulating, tolerating a regular diet, passing flatus, and urinating well. Patient is discharged home in stable condition on 10/27/17.   Physical exam  Vitals:   10/25/17 1515 10/26/17 0545 10/26/17 1912 10/27/17 0619  BP: 132/72 (!) 103/55 (!) 111/55 110/62  Pulse: 67 (!) 53 65 (!) 52  Resp: 18 18 17    Temp: 97.9 F (36.6 C) 98.1 F (36.7 C) 98 F (36.7 C) 98.1 F  (36.7 C)  TempSrc: Oral Oral Oral Oral  SpO2: 100%  99% 99%   General: alert, cooperative and no distress Lochia: appropriate Uterine Fundus: firm Incision: N/A DVT Evaluation: No evidence of DVT seen on physical exam. No significant calf/ankle edema. Labs: Lab Results  Component Value Date   WBC 11.2 (H) 10/25/2017   HGB 11.2 (L) 10/25/2017   HCT 33.0 (L) 10/25/2017   MCV 82.5 10/25/2017   PLT 365 10/25/2017   CMP Latest Ref Rng & Units 10/10/2016  Glucose 65 - 99 mg/dL 82  BUN 6 - 20 mg/dL 14  Creatinine 5.620.44 - 1.301.00 mg/dL 8.650.72  Sodium 784135 - 696145 mmol/L 139  Potassium 3.5 - 5.1 mmol/L 4.2  Chloride 101 - 111 mmol/L 106  CO2 22 - 32 mmol/L 26  Calcium 8.9 - 10.3 mg/dL 2.9(B8.8(L)  Total Protein 6.5 - 8.1 g/dL -  Total Bilirubin 0.3 - 1.2 mg/dL -  Alkaline Phos 38 -  126 U/L -  AST 15 - 41 U/L -  ALT 14 - 54 U/L -    Discharge instruction: per After Visit Summary and "Baby and Me Booklet".  After visit meds:  Allergies as of 10/27/2017   No Known Allergies     Medication List    TAKE these medications   buprenorphine 8 MG Subl SL tablet Commonly known as:  SUBUTEX Place 8 mg under the tongue daily.   CITRANATAL ASSURE 35-1 & 300 MG tablet One tablet and one capsule daily   ferrous sulfate 325 (65 FE) MG tablet Take 1 tablet (325 mg total) by mouth 2 (two) times daily with a meal.   ibuprofen 600 MG tablet Commonly known as:  ADVIL,MOTRIN Take 1 tablet (600 mg total) by mouth every 6 (six) hours.       Diet: routine diet  Activity: Advance as tolerated. Pelvic rest for 6 weeks.   Outpatient follow up:  4 weeks Follow up Appt:No future appointments. Follow up Visit:No Follow-up on file.  Postpartum contraception: Nexplanon  Newborn Data: Live born female  Birth Weight: 8 lb 10.1 oz (3915 g) APGAR: 8, 9  Newborn Delivery   Birth date/time:  10/25/2017 08:19:00 Delivery type:  Vaginal, Spontaneous     Baby Feeding: Bottle and Breast Disposition:home  with mother   10/27/2017 Frederik Pear, MD

## 2017-10-27 NOTE — Discharge Instructions (Signed)
Postpartum Care After Vaginal Delivery °The period of time right after you deliver your newborn is called the postpartum period. °What kind of medical care will I receive? °· You may continue to receive fluids and medicines through an IV tube inserted into one of your veins. °· If an incision was made near your vagina (episiotomy) or if you had some vaginal tearing during delivery, cold compresses may be placed on your episiotomy or your tear. This helps to reduce pain and swelling. °· You may be given a squirt bottle to use when you go to the bathroom. You may use this until you are comfortable wiping as usual. To use the squirt bottle, follow these steps: °? Before you urinate, fill the squirt bottle with warm water. Do not use hot water. °? After you urinate, while you are sitting on the toilet, use the squirt bottle to rinse the area around your urethra and vaginal opening. This rinses away any urine and blood. °? You may do this instead of wiping. As you start healing, you may use the squirt bottle before wiping yourself. Make sure to wipe gently. °? Fill the squirt bottle with clean water every time you use the bathroom. °· You will be given sanitary pads to wear. °How can I expect to feel? °· You may not feel the need to urinate for several hours after delivery. °· You will have some soreness and pain in your abdomen and vagina. °· If you are breastfeeding, you may have uterine contractions every time you breastfeed for up to several weeks postpartum. Uterine contractions help your uterus return to its normal size. °· It is normal to have vaginal bleeding (lochia) after delivery. The amount and appearance of lochia is often similar to a menstrual period in the first week after delivery. It will gradually decrease over the next few weeks to a dry, yellow-brown discharge. For most women, lochia stops completely by 6-8 weeks after delivery. Vaginal bleeding can vary from woman to woman. °· Within the first few  days after delivery, you may have breast engorgement. This is when your breasts feel heavy, full, and uncomfortable. Your breasts may also throb and feel hard, tightly stretched, warm, and tender. After this occurs, you may have milk leaking from your breasts. Your health care provider can help you relieve discomfort due to breast engorgement. Breast engorgement should go away within a few days. °· You may feel more sad or worried than normal due to hormonal changes after delivery. These feelings should not last more than a few days. If these feelings do not go away after several days, speak with your health care provider. °How should I care for myself? °· Tell your health care provider if you have pain or discomfort. °· Drink enough water to keep your urine clear or pale yellow. °· Wash your hands thoroughly with soap and water for at least 20 seconds after changing your sanitary pads, after using the toilet, and before holding or feeding your baby. °· If you are not breastfeeding, avoid touching your breasts a lot. Doing this can make your breasts produce more milk. °· If you become weak or lightheaded, or you feel like you might faint, ask for help before: °? Getting out of bed. °? Showering. °· Change your sanitary pads frequently. Watch for any changes in your flow, such as a sudden increase in volume, a change in color, the passing of large blood clots. If you pass a blood clot from your vagina, save it   to show to your health care provider. Do not flush blood clots down the toilet without having your health care provider look at them. °· Make sure that all your vaccinations are up to date. This can help protect you and your baby from getting certain diseases. You may need to have immunizations done before you leave the hospital. °· If desired, talk with your health care provider about methods of family planning or birth control (contraception). °How can I start bonding with my baby? °Spending as much time as  possible with your baby is very important. During this time, you and your baby can get to know each other and develop a bond. Having your baby stay with you in your room (rooming in) can give you time to get to know your baby. Rooming in can also help you become comfortable caring for your baby. Breastfeeding can also help you bond with your baby. °How can I plan for returning home with my baby? °· Make sure that you have a car seat installed in your vehicle. °? Your car seat should be checked by a certified car seat installer to make sure that it is installed safely. °? Make sure that your baby fits into the car seat safely. °· Ask your health care provider any questions you have about caring for yourself or your baby. Make sure that you are able to contact your health care provider with any questions after leaving the hospital. °This information is not intended to replace advice given to you by your health care provider. Make sure you discuss any questions you have with your health care provider. °Document Released: 06/04/2007 Document Revised: 01/10/2016 Document Reviewed: 07/12/2015 °Elsevier Interactive Patient Education © 2018 Elsevier Inc. ° °

## 2017-10-28 ENCOUNTER — Ambulatory Visit: Payer: Self-pay

## 2017-10-28 NOTE — Lactation Note (Signed)
This note was copied from a baby's chart. Lactation Consultation Note Baby 3866 hrs old. Mom came in feeding choice of formula. Mom's milk is coming in, breast engorged as well as monitoring baby for with drawls of subutex. Mom wants to give BM. Mom stated she has pumped and collected 90 ml BM.  Breast firm w/knots. Lt. Softer than Rt. D/t baby BF on the Lt. Breast. Mom has everted red nipples, sore. Comfort gels given. Mom stated she wants LC to see latch to make sure she is doing it right. Baby sleeping. Baby on DPT. Noted baby sneezing 3 times while in rm. baby jerks occasionally while sleeping. Doesn't look like a seizure at this time.  Discussed mom pumping every 3 hrs. Mom in agreement. Mom pumped 150 ml transitional milk. When mom first started pumping, Rt. Breast has some blood mixed in with the yellow. Noted clearing before finished.  Engorgement management, breast massage, milk storage, breast care, supply and demand discussed at length. Answered moms questions. Divided BM into bottles of 30 ml, 45 ml, 95ml. The 30 ml mom keeps in rm to supplement after BF. 45ml, and 95 ml. To be refrigerated. Discussed mom to divide the 95 ml as well. RN printing labels for milk.  [place] WIC in [place]. She will call the office Mon. Morning.  Mom is excited about pumping and hopefully helping baby go through with drawls.  Patient Name: Boy Melanie CrazierKaitlyn Bernasconi WUJWJ'XToday's Date: 10/28/2017 Reason for consult: Initial assessment;Engorgement;Early term 37-38.6wks   Maternal Data Has patient been taught Hand Expression?: Yes Does the patient have breastfeeding experience prior to this delivery?: No  Feeding Feeding Type: Breast Fed Length of feed: 20 min  LATCH Score       Type of Nipple: Everted at rest and after stimulation  Comfort (Breast/Nipple): Filling, red/small blisters or bruises, mild/mod discomfort        Interventions Interventions: Breast feeding basics reviewed;Support  pillows;Position options;Expressed milk;Breast massage;Hand express;Breast compression;DEBP  Lactation Tools Discussed/Used Tools: Pump Breast pump type: Double-Electric Breast Pump WIC Program: Yes Pump Review: Setup, frequency, and cleaning;Milk Storage Initiated by:: RN Date initiated:: 10/27/17   Consult Status Consult Status: Follow-up Date: 10/29/17 Follow-up type: In-patient    Jedrek Dinovo, Diamond NickelLAURA G 10/28/2017, 3:05 AM

## 2017-10-29 ENCOUNTER — Telehealth: Payer: Self-pay | Admitting: *Deleted

## 2017-10-29 ENCOUNTER — Ambulatory Visit: Payer: Self-pay

## 2017-10-29 LAB — BPAM RBC
BLOOD PRODUCT EXPIRATION DATE: 201904062359
BLOOD PRODUCT EXPIRATION DATE: 201904062359
UNIT TYPE AND RH: 9500
Unit Type and Rh: 9500

## 2017-10-29 LAB — TYPE AND SCREEN
ABO/RH(D): O NEG
ANTIBODY SCREEN: POSITIVE
UNIT DIVISION: 0
UNIT DIVISION: 0

## 2017-10-29 NOTE — Lactation Note (Signed)
This note was copied from a baby's chart. Lactation Consultation Note  Patient Name: [name] NGWLT'K Date: 10/29/2017 Reason for consult: Follow-up assessment  Mom says she is pleased with how breastfeeding is going. Mom to call me so that I can show her how to use the hand pump for both breasts at a time from the [facility] kit.   Matthias Hughs [facility] 10/29/2017, 8:06 AM

## 2017-10-29 NOTE — Telephone Encounter (Signed)
Called pt to set up pp appointment but had to leave a voicemail.  10-29-17  AS

## 2017-10-30 ENCOUNTER — Telehealth: Payer: Self-pay | Admitting: *Deleted

## 2017-10-30 ENCOUNTER — Encounter: Payer: Self-pay | Admitting: *Deleted

## 2017-10-30 NOTE — Telephone Encounter (Signed)
[facility]. Hemoglobin today was 10.1. Pt had a vaginal delivery 10/25/17. Pt ran out of prenatal vits. Pt states she will start back on iron. My chart message sent to pt. JSY

## 2017-12-05 ENCOUNTER — Ambulatory Visit: Payer: Medicaid Other | Admitting: Advanced Practice Midwife

## 2017-12-12 ENCOUNTER — Encounter: Payer: Self-pay | Admitting: *Deleted

## 2017-12-12 ENCOUNTER — Ambulatory Visit: Payer: Medicaid Other | Admitting: Women's Health
